# Patient Record
Sex: Female | Born: 1970 | Race: Black or African American | Hispanic: No | Marital: Single | Smoking: Never smoker
Health system: Southern US, Community
[De-identification: ages and names within clinical notes are randomized; demographics above are authoritative.]

## PROBLEM LIST (undated history)

## (undated) DIAGNOSIS — R519 Headache, unspecified: Secondary | ICD-10-CM

## (undated) DIAGNOSIS — K219 Gastro-esophageal reflux disease without esophagitis: Secondary | ICD-10-CM

## (undated) DIAGNOSIS — I10 Essential (primary) hypertension: Secondary | ICD-10-CM

## (undated) DIAGNOSIS — U071 COVID-19: Secondary | ICD-10-CM

## (undated) DIAGNOSIS — F419 Anxiety disorder, unspecified: Secondary | ICD-10-CM

## (undated) DIAGNOSIS — M1990 Unspecified osteoarthritis, unspecified site: Secondary | ICD-10-CM

## (undated) HISTORY — PX: TUBAL LIGATION: SHX77

---
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
## 1997-12-23 ENCOUNTER — Emergency Department (HOSPITAL_COMMUNITY): Admission: EM | Admit: 1997-12-23 | Discharge: 1997-12-23 | Payer: Self-pay | Admitting: Emergency Medicine

## 1998-01-04 ENCOUNTER — Encounter: Admission: RE | Admit: 1998-01-04 | Discharge: 1998-01-04 | Payer: Self-pay | Admitting: *Deleted

## 1998-02-18 ENCOUNTER — Emergency Department (HOSPITAL_COMMUNITY): Admission: EM | Admit: 1998-02-18 | Discharge: 1998-02-18 | Payer: Self-pay | Admitting: Emergency Medicine

## 1998-03-13 ENCOUNTER — Emergency Department (HOSPITAL_COMMUNITY): Admission: EM | Admit: 1998-03-13 | Discharge: 1998-03-13 | Payer: Self-pay | Admitting: Emergency Medicine

## 1998-04-15 ENCOUNTER — Emergency Department (HOSPITAL_COMMUNITY): Admission: EM | Admit: 1998-04-15 | Discharge: 1998-04-15 | Payer: Self-pay | Admitting: Emergency Medicine

## 1998-05-05 ENCOUNTER — Encounter: Admission: RE | Admit: 1998-05-05 | Discharge: 1998-05-05 | Payer: Self-pay | Admitting: Hematology and Oncology

## 1998-07-31 ENCOUNTER — Emergency Department (HOSPITAL_COMMUNITY): Admission: EM | Admit: 1998-07-31 | Discharge: 1998-07-31 | Payer: Self-pay | Admitting: Emergency Medicine

## 1998-10-06 ENCOUNTER — Encounter: Payer: Self-pay | Admitting: Occupational Medicine

## 1998-10-06 ENCOUNTER — Ambulatory Visit: Admission: RE | Admit: 1998-10-06 | Discharge: 1998-10-06 | Payer: Self-pay | Admitting: Occupational Medicine

## 1999-08-01 ENCOUNTER — Ambulatory Visit (HOSPITAL_COMMUNITY): Admission: RE | Admit: 1999-08-01 | Discharge: 1999-08-01 | Payer: Self-pay | Admitting: *Deleted

## 1999-08-15 ENCOUNTER — Inpatient Hospital Stay (HOSPITAL_COMMUNITY): Admission: AD | Admit: 1999-08-15 | Discharge: 1999-08-15 | Payer: Self-pay | Admitting: *Deleted

## 1999-09-02 ENCOUNTER — Inpatient Hospital Stay (HOSPITAL_COMMUNITY): Admission: AD | Admit: 1999-09-02 | Discharge: 1999-09-02 | Payer: Self-pay | Admitting: *Deleted

## 1999-09-26 ENCOUNTER — Ambulatory Visit (HOSPITAL_COMMUNITY): Admission: RE | Admit: 1999-09-26 | Discharge: 1999-09-26 | Payer: Self-pay | Admitting: *Deleted

## 1999-11-30 ENCOUNTER — Inpatient Hospital Stay (HOSPITAL_COMMUNITY): Admission: AD | Admit: 1999-11-30 | Discharge: 1999-11-30 | Payer: Self-pay | Admitting: Obstetrics

## 2000-01-30 ENCOUNTER — Inpatient Hospital Stay (HOSPITAL_COMMUNITY): Admission: AD | Admit: 2000-01-30 | Discharge: 2000-01-30 | Payer: Self-pay | Admitting: *Deleted

## 2000-02-06 ENCOUNTER — Inpatient Hospital Stay (HOSPITAL_COMMUNITY): Admission: AD | Admit: 2000-02-06 | Discharge: 2000-02-06 | Payer: Self-pay | Admitting: *Deleted

## 2000-02-14 ENCOUNTER — Inpatient Hospital Stay (HOSPITAL_COMMUNITY): Admission: AD | Admit: 2000-02-14 | Discharge: 2000-02-17 | Payer: Self-pay | Admitting: Obstetrics

## 2000-02-14 ENCOUNTER — Encounter (INDEPENDENT_AMBULATORY_CARE_PROVIDER_SITE_OTHER): Payer: Self-pay

## 2000-11-27 ENCOUNTER — Emergency Department (HOSPITAL_COMMUNITY): Admission: EM | Admit: 2000-11-27 | Discharge: 2000-11-27 | Payer: Self-pay

## 2001-02-14 ENCOUNTER — Emergency Department (HOSPITAL_COMMUNITY): Admission: EM | Admit: 2001-02-14 | Discharge: 2001-02-14 | Payer: Self-pay | Admitting: Emergency Medicine

## 2001-10-20 ENCOUNTER — Encounter: Admission: RE | Admit: 2001-10-20 | Discharge: 2001-10-20 | Payer: Self-pay

## 2001-11-10 ENCOUNTER — Emergency Department (HOSPITAL_COMMUNITY): Admission: EM | Admit: 2001-11-10 | Discharge: 2001-11-10 | Payer: Self-pay | Admitting: Emergency Medicine

## 2002-05-20 ENCOUNTER — Ambulatory Visit (HOSPITAL_COMMUNITY): Admission: RE | Admit: 2002-05-20 | Discharge: 2002-05-20 | Payer: Self-pay

## 2003-02-20 ENCOUNTER — Emergency Department (HOSPITAL_COMMUNITY): Admission: EM | Admit: 2003-02-20 | Discharge: 2003-02-20 | Payer: Self-pay | Admitting: *Deleted

## 2003-07-19 ENCOUNTER — Emergency Department (HOSPITAL_COMMUNITY): Admission: EM | Admit: 2003-07-19 | Discharge: 2003-07-19 | Payer: Self-pay | Admitting: Emergency Medicine

## 2003-07-20 ENCOUNTER — Encounter (INDEPENDENT_AMBULATORY_CARE_PROVIDER_SITE_OTHER): Payer: Self-pay | Admitting: Specialist

## 2003-07-20 ENCOUNTER — Inpatient Hospital Stay (HOSPITAL_COMMUNITY): Admission: EM | Admit: 2003-07-20 | Discharge: 2003-07-22 | Payer: Self-pay | Admitting: Emergency Medicine

## 2004-01-08 ENCOUNTER — Emergency Department (HOSPITAL_COMMUNITY): Admission: EM | Admit: 2004-01-08 | Discharge: 2004-01-08 | Payer: Self-pay | Admitting: Emergency Medicine

## 2004-01-23 HISTORY — PX: CHOLECYSTECTOMY: SHX55

## 2004-10-10 ENCOUNTER — Other Ambulatory Visit: Admission: RE | Admit: 2004-10-10 | Discharge: 2004-10-10 | Payer: Self-pay | Admitting: Obstetrics and Gynecology

## 2005-04-02 ENCOUNTER — Ambulatory Visit: Payer: Self-pay | Admitting: Family Medicine

## 2005-04-03 ENCOUNTER — Ambulatory Visit: Payer: Self-pay | Admitting: *Deleted

## 2005-06-27 ENCOUNTER — Ambulatory Visit: Payer: Self-pay | Admitting: Family Medicine

## 2005-07-19 ENCOUNTER — Ambulatory Visit: Payer: Self-pay | Admitting: Family Medicine

## 2005-07-26 ENCOUNTER — Ambulatory Visit (HOSPITAL_COMMUNITY): Admission: RE | Admit: 2005-07-26 | Discharge: 2005-07-26 | Payer: Self-pay | Admitting: Family Medicine

## 2005-08-02 ENCOUNTER — Ambulatory Visit: Payer: Self-pay | Admitting: Family Medicine

## 2005-10-10 ENCOUNTER — Ambulatory Visit: Payer: Self-pay | Admitting: Family Medicine

## 2005-10-11 ENCOUNTER — Ambulatory Visit: Payer: Self-pay | Admitting: Family Medicine

## 2005-10-11 ENCOUNTER — Ambulatory Visit (HOSPITAL_COMMUNITY): Admission: RE | Admit: 2005-10-11 | Discharge: 2005-10-11 | Payer: Self-pay | Admitting: Family Medicine

## 2005-12-24 ENCOUNTER — Ambulatory Visit: Payer: Self-pay | Admitting: Family Medicine

## 2006-01-03 ENCOUNTER — Ambulatory Visit: Payer: Self-pay | Admitting: Family Medicine

## 2006-01-27 ENCOUNTER — Emergency Department (HOSPITAL_COMMUNITY): Admission: EM | Admit: 2006-01-27 | Discharge: 2006-01-27 | Payer: Self-pay | Admitting: Emergency Medicine

## 2006-02-21 ENCOUNTER — Ambulatory Visit: Payer: Self-pay | Admitting: Internal Medicine

## 2006-02-27 ENCOUNTER — Ambulatory Visit (HOSPITAL_COMMUNITY): Admission: RE | Admit: 2006-02-27 | Discharge: 2006-02-27 | Payer: Self-pay | Admitting: Internal Medicine

## 2006-03-07 ENCOUNTER — Ambulatory Visit: Payer: Self-pay | Admitting: Internal Medicine

## 2006-03-11 ENCOUNTER — Ambulatory Visit: Payer: Self-pay | Admitting: Family Medicine

## 2006-03-11 ENCOUNTER — Encounter (INDEPENDENT_AMBULATORY_CARE_PROVIDER_SITE_OTHER): Payer: Self-pay | Admitting: Family Medicine

## 2006-05-16 ENCOUNTER — Ambulatory Visit: Payer: Self-pay | Admitting: Internal Medicine

## 2006-05-17 ENCOUNTER — Ambulatory Visit: Payer: Self-pay | Admitting: Internal Medicine

## 2006-07-11 ENCOUNTER — Ambulatory Visit: Payer: Self-pay | Admitting: Internal Medicine

## 2006-08-09 ENCOUNTER — Ambulatory Visit: Payer: Self-pay | Admitting: Family Medicine

## 2006-10-20 ENCOUNTER — Emergency Department (HOSPITAL_COMMUNITY): Admission: EM | Admit: 2006-10-20 | Discharge: 2006-10-20 | Payer: Self-pay | Admitting: Family Medicine

## 2006-11-01 ENCOUNTER — Ambulatory Visit: Payer: Self-pay | Admitting: Internal Medicine

## 2007-01-07 ENCOUNTER — Ambulatory Visit: Payer: Self-pay | Admitting: Family Medicine

## 2007-01-07 ENCOUNTER — Encounter (INDEPENDENT_AMBULATORY_CARE_PROVIDER_SITE_OTHER): Payer: Self-pay | Admitting: Family Medicine

## 2007-01-07 LAB — CONVERTED CEMR LAB
ALT: 8 units/L (ref 0–35)
AST: 15 units/L (ref 0–37)
Albumin: 4.3 g/dL (ref 3.5–5.2)
Alkaline Phosphatase: 64 units/L (ref 39–117)
BUN: 9 mg/dL (ref 6–23)
Basophils Absolute: 0 10*3/uL (ref 0.0–0.1)
Basophils Relative: 0 % (ref 0–1)
CO2: 21 meq/L (ref 19–32)
Calcium: 9.3 mg/dL (ref 8.4–10.5)
Chloride: 108 meq/L (ref 96–112)
Cholesterol: 158 mg/dL (ref 0–200)
Creatinine, Ser: 0.86 mg/dL (ref 0.40–1.20)
Eosinophils Absolute: 0 10*3/uL — ABNORMAL LOW (ref 0.2–0.7)
Eosinophils Relative: 1 % (ref 0–5)
Glucose, Bld: 76 mg/dL (ref 70–99)
HCT: 39.7 % (ref 36.0–46.0)
HDL: 48 mg/dL (ref >39)
Hemoglobin: 13.4 g/dL (ref 12.0–15.0)
LDL Cholesterol: 100 mg/dL — ABNORMAL HIGH (ref 0–99)
Lymphocytes Relative: 39 % (ref 12–46)
Lymphs Abs: 2.2 10*3/uL (ref 0.7–4.0)
MCHC: 33.8 g/dL (ref 30.0–36.0)
MCV: 86.9 fL (ref 78.0–100.0)
Monocytes Absolute: 0.5 10*3/uL (ref 0.1–1.0)
Monocytes Relative: 8 % (ref 3–12)
Neutro Abs: 2.9 10*3/uL (ref 1.7–7.7)
Neutrophils Relative %: 52 % (ref 43–77)
Platelets: 246 10*3/uL (ref 150–400)
Potassium: 3.8 meq/L (ref 3.5–5.3)
RBC: 4.57 M/uL (ref 3.87–5.11)
RDW: 13.3 % (ref 11.5–15.5)
Sodium: 141 meq/L (ref 135–145)
TSH: 0.856 microintl units/mL (ref 0.350–5.50)
Total Bilirubin: 1 mg/dL (ref 0.3–1.2)
Total CHOL/HDL Ratio: 3.3
Total Protein: 8.3 g/dL (ref 6.0–8.3)
Triglycerides: 52 mg/dL (ref <150)
VLDL: 10 mg/dL (ref 0–40)
WBC: 5.7 10*3/uL (ref 4.0–10.5)

## 2007-01-24 ENCOUNTER — Ambulatory Visit: Payer: Self-pay | Admitting: Internal Medicine

## 2007-03-10 ENCOUNTER — Ambulatory Visit: Payer: Self-pay | Admitting: Family Medicine

## 2007-03-10 ENCOUNTER — Encounter (INDEPENDENT_AMBULATORY_CARE_PROVIDER_SITE_OTHER): Payer: Self-pay | Admitting: Internal Medicine

## 2007-03-10 ENCOUNTER — Encounter: Payer: Self-pay | Admitting: Family Medicine

## 2007-03-10 LAB — CONVERTED CEMR LAB: RBC / HPF: NONE SEEN (ref <3)

## 2007-03-15 ENCOUNTER — Emergency Department (HOSPITAL_COMMUNITY): Admission: EM | Admit: 2007-03-15 | Discharge: 2007-03-15 | Payer: Self-pay | Admitting: Family Medicine

## 2007-04-24 ENCOUNTER — Ambulatory Visit: Payer: Self-pay | Admitting: Internal Medicine

## 2007-07-16 ENCOUNTER — Ambulatory Visit: Payer: Self-pay | Admitting: Internal Medicine

## 2007-10-15 ENCOUNTER — Ambulatory Visit: Payer: Self-pay | Admitting: Internal Medicine

## 2007-11-27 ENCOUNTER — Emergency Department (HOSPITAL_COMMUNITY): Admission: EM | Admit: 2007-11-27 | Discharge: 2007-11-27 | Payer: Self-pay | Admitting: Emergency Medicine

## 2007-12-30 ENCOUNTER — Encounter (INDEPENDENT_AMBULATORY_CARE_PROVIDER_SITE_OTHER): Payer: Self-pay | Admitting: Adult Health

## 2007-12-30 ENCOUNTER — Ambulatory Visit: Payer: Self-pay | Admitting: Internal Medicine

## 2007-12-30 LAB — CONVERTED CEMR LAB
ALT: 8 units/L (ref 0–35)
Basophils Absolute: 0 10*3/uL (ref 0.0–0.1)
CO2: 21 meq/L (ref 19–32)
Chloride: 110 meq/L (ref 96–112)
Cholesterol: 152 mg/dL (ref 0–200)
Eosinophils Relative: 1 % (ref 0–5)
Lymphocytes Relative: 40 % (ref 12–46)
Neutro Abs: 2.2 10*3/uL (ref 1.7–7.7)
Neutrophils Relative %: 49 % (ref 43–77)
Platelets: 196 10*3/uL (ref 150–400)
Potassium: 3.9 meq/L (ref 3.5–5.3)
RDW: 13.2 % (ref 11.5–15.5)
Sodium: 142 meq/L (ref 135–145)
Total Bilirubin: 1.1 mg/dL (ref 0.3–1.2)
Total Protein: 7.6 g/dL (ref 6.0–8.3)
VLDL: 13 mg/dL (ref 0–40)

## 2008-01-06 ENCOUNTER — Ambulatory Visit: Payer: Self-pay | Admitting: Internal Medicine

## 2008-03-15 ENCOUNTER — Ambulatory Visit: Payer: Self-pay | Admitting: Internal Medicine

## 2008-03-16 ENCOUNTER — Encounter (INDEPENDENT_AMBULATORY_CARE_PROVIDER_SITE_OTHER): Payer: Self-pay | Admitting: Adult Health

## 2008-03-29 ENCOUNTER — Ambulatory Visit: Payer: Self-pay | Admitting: Internal Medicine

## 2008-03-30 ENCOUNTER — Encounter (INDEPENDENT_AMBULATORY_CARE_PROVIDER_SITE_OTHER): Payer: Self-pay | Admitting: Adult Health

## 2008-04-12 ENCOUNTER — Encounter (INDEPENDENT_AMBULATORY_CARE_PROVIDER_SITE_OTHER): Payer: Self-pay | Admitting: Adult Health

## 2008-04-12 ENCOUNTER — Ambulatory Visit: Payer: Self-pay | Admitting: Internal Medicine

## 2008-04-13 ENCOUNTER — Encounter (INDEPENDENT_AMBULATORY_CARE_PROVIDER_SITE_OTHER): Payer: Self-pay | Admitting: Adult Health

## 2008-05-13 ENCOUNTER — Ambulatory Visit: Payer: Self-pay | Admitting: Internal Medicine

## 2008-05-13 ENCOUNTER — Encounter (INDEPENDENT_AMBULATORY_CARE_PROVIDER_SITE_OTHER): Payer: Self-pay | Admitting: Adult Health

## 2008-06-23 ENCOUNTER — Ambulatory Visit: Payer: Self-pay | Admitting: Internal Medicine

## 2008-09-08 ENCOUNTER — Ambulatory Visit: Payer: Self-pay | Admitting: Internal Medicine

## 2008-09-14 ENCOUNTER — Ambulatory Visit: Payer: Self-pay | Admitting: Internal Medicine

## 2008-10-13 ENCOUNTER — Encounter (INDEPENDENT_AMBULATORY_CARE_PROVIDER_SITE_OTHER): Payer: Self-pay | Admitting: Adult Health

## 2008-10-13 ENCOUNTER — Ambulatory Visit: Payer: Self-pay | Admitting: Family Medicine

## 2008-10-13 LAB — CONVERTED CEMR LAB
ALT: 8 units/L (ref 0–35)
AST: 12 units/L (ref 0–37)
Basophils Absolute: 0 10*3/uL (ref 0.0–0.1)
Basophils Relative: 0 % (ref 0–1)
Creatinine, Ser: 0.97 mg/dL (ref 0.40–1.20)
Eosinophils Absolute: 0 10*3/uL (ref 0.0–0.7)
MCHC: 33.4 g/dL (ref 30.0–36.0)
MCV: 87.7 fL (ref 78.0–100.0)
Neutrophils Relative %: 54 % (ref 43–77)
Platelets: 223 10*3/uL (ref 150–400)
TSH: 0.896 microintl units/mL (ref 0.350–4.500)
Total Bilirubin: 0.6 mg/dL (ref 0.3–1.2)
Total CHOL/HDL Ratio: 4.2
VLDL: 25 mg/dL (ref 0–40)
WBC: 6.5 10*3/uL (ref 4.0–10.5)

## 2008-11-29 ENCOUNTER — Ambulatory Visit: Payer: Self-pay | Admitting: Internal Medicine

## 2008-12-02 ENCOUNTER — Ambulatory Visit (HOSPITAL_COMMUNITY): Admission: RE | Admit: 2008-12-02 | Discharge: 2008-12-02 | Payer: Self-pay | Admitting: Internal Medicine

## 2008-12-06 ENCOUNTER — Ambulatory Visit: Payer: Self-pay | Admitting: Internal Medicine

## 2009-01-19 ENCOUNTER — Ambulatory Visit: Payer: Self-pay | Admitting: Internal Medicine

## 2009-01-19 ENCOUNTER — Other Ambulatory Visit: Admission: RE | Admit: 2009-01-19 | Discharge: 2009-01-19 | Payer: Self-pay | Admitting: Internal Medicine

## 2009-01-20 ENCOUNTER — Encounter (INDEPENDENT_AMBULATORY_CARE_PROVIDER_SITE_OTHER): Payer: Self-pay | Admitting: Adult Health

## 2009-03-01 ENCOUNTER — Ambulatory Visit: Payer: Self-pay | Admitting: Internal Medicine

## 2009-04-05 ENCOUNTER — Ambulatory Visit: Payer: Self-pay | Admitting: Family Medicine

## 2009-04-05 ENCOUNTER — Encounter (INDEPENDENT_AMBULATORY_CARE_PROVIDER_SITE_OTHER): Payer: Self-pay | Admitting: Adult Health

## 2009-04-05 LAB — CONVERTED CEMR LAB: Microalb, Ur: 2.07 mg/dL — ABNORMAL HIGH (ref 0.00–1.89)

## 2009-04-07 ENCOUNTER — Ambulatory Visit (HOSPITAL_COMMUNITY): Admission: RE | Admit: 2009-04-07 | Discharge: 2009-04-07 | Payer: Self-pay | Admitting: Family Medicine

## 2009-04-20 ENCOUNTER — Emergency Department (HOSPITAL_COMMUNITY): Admission: EM | Admit: 2009-04-20 | Discharge: 2009-04-20 | Payer: Self-pay | Admitting: Emergency Medicine

## 2009-08-04 ENCOUNTER — Ambulatory Visit: Payer: Self-pay | Admitting: Family Medicine

## 2009-08-17 ENCOUNTER — Ambulatory Visit: Payer: Self-pay | Admitting: Internal Medicine

## 2009-09-29 ENCOUNTER — Emergency Department (HOSPITAL_COMMUNITY): Admission: EM | Admit: 2009-09-29 | Discharge: 2009-09-29 | Payer: Self-pay | Admitting: Emergency Medicine

## 2009-10-03 ENCOUNTER — Emergency Department (HOSPITAL_COMMUNITY): Admission: EM | Admit: 2009-10-03 | Discharge: 2009-10-03 | Payer: Self-pay | Admitting: Family Medicine

## 2009-12-11 ENCOUNTER — Emergency Department (HOSPITAL_COMMUNITY)
Admission: EM | Admit: 2009-12-11 | Discharge: 2009-12-11 | Payer: Self-pay | Source: Home / Self Care | Admitting: Emergency Medicine

## 2009-12-12 ENCOUNTER — Emergency Department (HOSPITAL_COMMUNITY): Admission: EM | Admit: 2009-12-12 | Discharge: 2009-12-12 | Payer: Self-pay | Source: Home / Self Care

## 2010-01-12 ENCOUNTER — Encounter (INDEPENDENT_AMBULATORY_CARE_PROVIDER_SITE_OTHER): Payer: Self-pay | Admitting: *Deleted

## 2010-01-26 ENCOUNTER — Emergency Department (HOSPITAL_COMMUNITY)
Admission: EM | Admit: 2010-01-26 | Discharge: 2010-01-26 | Payer: Self-pay | Source: Home / Self Care | Admitting: Family Medicine

## 2010-01-26 LAB — POCT PREGNANCY, URINE: Preg Test, Ur: NEGATIVE

## 2010-02-07 ENCOUNTER — Emergency Department (HOSPITAL_COMMUNITY)
Admission: EM | Admit: 2010-02-07 | Discharge: 2010-02-07 | Payer: Self-pay | Source: Home / Self Care | Admitting: Family Medicine

## 2010-02-08 LAB — WET PREP, GENITAL
Clue Cells Wet Prep HPF POC: NONE SEEN
Trich, Wet Prep: NONE SEEN

## 2010-02-11 ENCOUNTER — Encounter: Payer: Self-pay | Admitting: Obstetrics and Gynecology

## 2010-02-13 LAB — GC/CHLAMYDIA PROBE AMP, GENITAL
Chlamydia, DNA Probe: NEGATIVE
GC Probe Amp, Genital: NEGATIVE

## 2010-04-04 LAB — COMPREHENSIVE METABOLIC PANEL
Albumin: 3.8 g/dL (ref 3.5–5.2)
BUN: 8 mg/dL (ref 6–23)
Calcium: 9.4 mg/dL (ref 8.4–10.5)
Glucose, Bld: 116 mg/dL — ABNORMAL HIGH (ref 70–99)
Potassium: 3.9 mEq/L (ref 3.5–5.1)
Sodium: 140 mEq/L (ref 135–145)
Total Protein: 7.9 g/dL (ref 6.0–8.3)

## 2010-04-04 LAB — CBC
HCT: 39.3 % (ref 36.0–46.0)
MCHC: 34.5 g/dL (ref 30.0–36.0)
Platelets: 208 10*3/uL (ref 150–400)
RDW: 12.7 % (ref 11.5–15.5)
WBC: 6.2 10*3/uL (ref 4.0–10.5)

## 2010-04-04 LAB — WET PREP, GENITAL
Trich, Wet Prep: NONE SEEN
Yeast Wet Prep HPF POC: NONE SEEN

## 2010-04-04 LAB — DIFFERENTIAL
Lymphs Abs: 1.4 10*3/uL (ref 0.7–4.0)
Monocytes Absolute: 0.4 10*3/uL (ref 0.1–1.0)
Monocytes Relative: 7 % (ref 3–12)
Neutro Abs: 4.4 10*3/uL (ref 1.7–7.7)
Neutrophils Relative %: 70 % (ref 43–77)

## 2010-04-04 LAB — URINALYSIS, ROUTINE W REFLEX MICROSCOPIC
Bilirubin Urine: NEGATIVE
Glucose, UA: NEGATIVE mg/dL
Ketones, ur: NEGATIVE mg/dL
Protein, ur: NEGATIVE mg/dL
Urobilinogen, UA: 0.2 mg/dL (ref 0.0–1.0)

## 2010-04-04 LAB — URINE MICROSCOPIC-ADD ON

## 2010-04-04 LAB — GC/CHLAMYDIA PROBE AMP, GENITAL: Chlamydia, DNA Probe: NEGATIVE

## 2010-04-06 LAB — POCT URINALYSIS DIPSTICK
Bilirubin Urine: NEGATIVE
Glucose, UA: NEGATIVE mg/dL
Ketones, ur: NEGATIVE mg/dL
Specific Gravity, Urine: 1.025 (ref 1.005–1.030)
pH: 5 (ref 5.0–8.0)

## 2010-06-09 NOTE — H&P (Signed)
Kristine Kelly, Kristine Kelly                          ACCOUNT NO.:  [id number]   MEDICAL RECORD NO.:  [id number]                   PATIENT TYPE:  INP   LOCATION:  0102                                 FACILITY:  [facility]   PHYSICIAN:  Adolph Pollack, M.D.            DATE OF BIRTH:  [birth year]   DATE OF ADMISSION:  07/20/2003  DATE OF DISCHARGE:                                HISTORY & PHYSICAL   CHIEF COMPLAINT:  Right upper quadrant pain, nausea, vomiting.   HISTORY OF PRESENT ILLNESS:  This is a 40 year old female who is otherwise  healthy.  She had some tacos Wednesday night and Thursday woke with up with  some pain in the right upper quadrant region radiating through the back and  nausea and vomiting.  It has been progressively increasing over the weekend.  She was seen in the emergency department yesterday, had some blood work done  that was all normal, was given some pain medicine and nausea medication, and  sent home but she presents back today.  Today she was seen back in the  emergency department, had an ultrasound demonstrating gallstones but no  pericholecystic fluid or gallbladder wall thickening.  Despite the  intravenous pain medicine, she continues to have recurring episodes of pain  and I have been asked to see her.   PAST MEDICAL HISTORY:  1. Benign right breast mass.   PREVIOUS OPERATIONS:  1. Bilateral tubal ligation.  2. Bunionectomy.  3. Right breast biopsy.   ALLERGIES:  None.   MEDICATIONS:  1. Phenergan.  2. Zantac.  3. Levsin.   SOCIAL HISTORY:  Single mother.  Works as a Conservation officer, [employer].  Denies tobacco use.  Occasional alcohol use.   FAMILY HISTORY:  No chronic illnesses in her family per her report.   REVIEW OF SYSTEMS:  CARDIAC:  No known hypertension or cardiac disease.  PULMONARY:  No asthma, pneumonia, TB.  GI:  No peptic ulcer disease,  hepatitis, colitis.  She does state that with her last two bouts of emesis,  it was a little bit blood tinged.  GU:   No kidney stones, urinary tract  infections.  ENDOCRINE:  No thyroid disease, diabetes, hypercholesterolemia.  NEUROLOGIC:  No strokes or seizures.  HEMATOLOGIC:  Denies bleeding  disorders, blood clots or transfusions.   PHYSICAL EXAMINATION:  GENERAL:  An ill-appearing female who is obese.  VITAL SIGNS:  Temperature is 97.1, blood pressure 131/86, pulse 81.  EYES:  Extraocular motions intact.  No icterus noted.  NECK:  Supple without palpable masses or obvious thyroid enlargement.  RESPIRATORY:  The breath sounds are equal and clear, respirations unlabored.  CARDIOVASCULAR:  Heart demonstrates a regular rate and rhythm, no murmur  heard.  No lower extremity edema.  ABDOMEN:  Soft with a small subumbilical scar.  There is right upper  quadrant tenderness and guarding, and a positive Murphy's sign.  No palpable  masses, however.  No organomegaly.  EXTREMITIES:  Good muscle tone and full range of motion.   LABORATORY DATA:  From July 19, 2003, demonstrates a CMET that is normal,  amylase and lipase normal, CBC normal.  Urine pregnancy test negative.   IMPRESSION:  Evolving acute cholecystitis __________ recently discovered  cholelithiasis.  Pattern of pain is worrisome for acute cystic duct  obstruction despite normal laboratory values yesterday.   PLAN:  Admission to the hospital for IV fluid hydration and IV antibiotics.  I discussed with her cholecystectomy.  We went over the procedure, the  rationale and the risks.  The risks include but are not limited to bleeding,  infection, anesthetic risks, injury to the common bile duct/intestine, and  liver, postprandial diarrhea, and time off work.  She seemed to understand  this and is agreeable to proceeding.                                               Adolph Pollack, M.D.    Kari Baars  D:  07/20/2003  T:  07/20/2003  Job:  [id number]

## 2010-06-09 NOTE — Op Note (Signed)
Kristine Kelly, Kristine Kelly                          ACCOUNT NO.:  [id number]   MEDICAL RECORD NO.:  [id number]                   PATIENT TYPE:  INP   LOCATION:  0102                                 FACILITY:  [facility]   PHYSICIAN:  Adolph Pollack, M.D.            DATE OF BIRTH:  Dec 18, 1970   DATE OF PROCEDURE:  07/20/2003  DATE OF DISCHARGE:                                 OPERATIVE REPORT   PREOPERATIVE DIAGNOSIS:  Acute cholecystitis.   POSTOPERATIVE DIAGNOSIS:  Acute cholecystitis.   PROCEDURE:  Laparoscopic cholecystectomy with intraoperative cholangiogram.   SURGEON:  Adolph Pollack, M.D.   ASSISTANT:  Anselm Pancoast. Zachery Dakins, M.D.   ANESTHESIA:  General.   INDICATIONS:  This is a 40 year old female who has had progressively  increasing and persistent right upper quadrant pain who has been seen in the  emergency department twice with the last time today.  She has gallstones.  Clinically, she has acute cholecystitis by exam and history.  Liver function  tests and white blood cell count are normal at this time.  She now presents  for a cholecystectomy.   TECHNIQUE:  She was seen in the holding area and then brought to the  operating room and placed supine on the operating room table.  A general  anesthetic was administered.  The abdominal wall was sterilely prepped and  draped.  Local anesthetic consisting of __________ Marcaine was infiltrated  in the subumbilical region, and a small subumbilical incision was made  through the skin and subcutaneous tissue until the fascia was identified.  A  small incision was made in the midline fascia, and the peritoneal cavity was  entered bluntly and under direct vision.  A purse-string suture of 0 Vicryl  was placed around the fascial edges.  A Hasson trocar was introduced into  the peritoneal cavity.  A pneumoperitoneum was created by insufflation of  CO2 gas.  The laparoscope was then introduced, and she was placed in reverse  Trendelenburg position with the right side tilted slightly up.  The  gallbladder was edematous, inflamed, and tense.  An 11 mm trocar was placed  through an epigastric incision, and two 5 mm trocars were placed in the  right mid abdomen.  We were able to grasp the fundus of the gallbladder and  retract it towards the right shoulder.  The infundibulum was then grasped,  and it was mobilized.  Using blunt dissection, I identified the cystic duct  and the cystic artery and created windows around these.  The cystic artery  laid somewhat anterior to the cystic duct.  I clipped the cystic artery and  then divided it.  I then placed a clip at the cystic duct/gallbladder  junction and made a small incision in the cystic duct.  A cholangiocatheter  was passed through the anterior abdominal wall and put into the cystic duct  and a cholangiogram was performed.   Under  real-time fluoroscopy, dilute contrast material was injected into the  cystic duct.  There was a relatively short cystic duct.  The common hepatic,  right and left hepatic, and common bile ducts were all opacified, and  contrast quickly spilled into the duodenum without obvious evidence of  obstruction.  Final report is pending the radiologist's interpretation.   The cholangiocatheter was removed.  The cystic duct was then clipped three  times and __________ and divided sharply.  Using electrocautery, the  gallbladder was dissected free from the liver bed.  A very small puncture  wound was made in the gallbladder, and white bile was noted.  Most of this  was suctioned out.  Once the gallbladder was removed from the liver bed, it  was placed in the Endopouch bag.  The gallbladder fossa was copiously  irrigated with saline and bleeding points were controlled with cautery.  I  used approximately 2 liters of solution to irrigate the perihepatic area and  evacuate this.  The area was inspected once more, and no bile leak or  bleeding was  noted.   Next, I removed the gallbladder in the Endopouch bag through the  subumbilical incision and then closed the fascia by tightening up and tying  down the purse-string suture under laparoscopic vision.  The remaining  trocars were removed, and a pneumoperitoneum was released.  The skin  incisions were closed with 4-0 Monocryl and subcuticular stitches.  Sterile  dressings were applied.   She tolerated the procedure well without any apparent complications.  She  was taken to the recovery room in satisfactory condition.                                               Adolph Pollack, M.D.    Kari Baars  D:  07/20/2003  T:  07/20/2003  Job:  [id number]

## 2010-06-09 NOTE — Op Note (Signed)
[facility]  Patient:    Kristine Kelly, Kristine Kelly                       MRN: [id number] Proc. Date: 02/15/00 Adm. Date:  [id number] Disc. Date: [id number] Attending:  Michaelle Copas                           Operative Report  PREOPERATIVE DIAGNOSIS:       Multiparity, desires permanent sterilization.  POSTOPERATIVE DIAGNOSIS:      Multiparity, desires permanent sterilization.  PROCEDURE:                    Postpartum tubal ligation, modified                               Pomeroy method. SURGEON:                      Roseanna Rainbow, M.D.  ANESTHESIA:                   Spinal.  COMPLICATIONS:                None.  ESTIMATED BLOOD LOSS:         Less than 20 cc.  FLUIDS:                       As per anesthesia.  INDICATIONS:                  The patient is a para 3, status post an SVD who desires permanent sterilization. The risks and benefits of the procedure were discussed with the patient including risk of failure of 4-8:1000 with an increased risk of an ectopic gestation if pregnancy occurs.  FINDINGS:                     Normal tubes and uterus.  DESCRIPTION OF PROCEDURE:     The patient was taken to the operating room where a spinal anesthetic was administered and found to be adequate. A small transverse infraumbilical skin incision was then made with the scalpel. The incision was carried down through the underlying fascia until the parietal peritoneum was identified and entered. The peritoneum was noted to be free of any adhesions and the incision was extended with the Metzenbaum scissors. The patients left fallopian tube was then identified, brought to the incision and grasped with a Babcock clamp and the tube was then followed out to the fimbria. The Babcock clamps was then used to grasp the tube approximately 4 cm from the cornual region. A 2- to 3-cm segment of tube was doubly ligated with free ties of plain gut and excised.  Excellent hemostasis was noted and the tube was returned to the abdomen. The right fallopian tube was then manipulated in a similar fashion. The peritoneum and fascia were closed in a single layer using 0 Vicryl. The skin was closed in a subcuticular fashion using 3-0 Vicryl. The patient tolerated the procedure well. Sponge, lap, and needle counts were correct x 2. The patient was taken to the PACU in stable condition.  PATHOLOGY:                    Segments of right and left fallopian tubes. DD:  02/15/00 TD:  02/15/00 Job: [id number]

## 2010-08-27 ENCOUNTER — Inpatient Hospital Stay (INDEPENDENT_AMBULATORY_CARE_PROVIDER_SITE_OTHER)
Admission: RE | Admit: 2010-08-27 | Discharge: 2010-08-27 | Disposition: A | Discharge status: Home / Self Care | Payer: Self-pay | Source: Ambulatory Visit | Attending: Family Medicine | Admitting: Family Medicine

## 2010-08-27 DIAGNOSIS — H6690 Otitis media, unspecified, unspecified ear: Secondary | ICD-10-CM

## 2010-10-03 ENCOUNTER — Other Ambulatory Visit: Payer: Self-pay | Admitting: Family Medicine

## 2010-10-04 ENCOUNTER — Other Ambulatory Visit: Payer: Self-pay | Admitting: Internal Medicine

## 2010-10-04 DIAGNOSIS — R102 Pelvic and perineal pain: Secondary | ICD-10-CM

## 2010-10-09 ENCOUNTER — Ambulatory Visit (HOSPITAL_COMMUNITY)
Admission: RE | Admit: 2010-10-09 | Discharge: 2010-10-09 | Disposition: A | Discharge status: Home / Self Care | Payer: Self-pay | Source: Ambulatory Visit | Attending: Internal Medicine | Admitting: Internal Medicine

## 2010-10-09 DIAGNOSIS — R102 Pelvic and perineal pain: Secondary | ICD-10-CM

## 2010-10-09 DIAGNOSIS — R109 Unspecified abdominal pain: Secondary | ICD-10-CM | POA: Insufficient documentation

## 2010-10-24 LAB — BASIC METABOLIC PANEL
CO2: 23
Calcium: 9.2
Chloride: 109
GFR calc Af Amer: >60
Potassium: 3.5
Sodium: 140

## 2010-10-24 LAB — POCT CARDIAC MARKERS
CKMB, poc: 1.4
Troponin i, poc: <0.05

## 2010-12-13 ENCOUNTER — Emergency Department (HOSPITAL_COMMUNITY)
Admission: EM | Admit: 2010-12-13 | Discharge: 2010-12-13 | Disposition: A | Discharge status: Home / Self Care | Payer: Self-pay | Source: Home / Self Care | Attending: Family Medicine | Admitting: Family Medicine

## 2010-12-13 DIAGNOSIS — B373 Candidiasis of vulva and vagina: Secondary | ICD-10-CM

## 2010-12-13 HISTORY — DX: Essential (primary) hypertension: I10

## 2010-12-13 LAB — POCT URINALYSIS DIP (DEVICE)
Ketones, ur: 15 mg/dL — AB
Leukocytes, UA: NEGATIVE
Protein, ur: NEGATIVE mg/dL
pH: 5.5 (ref 5.0–8.0)

## 2010-12-13 LAB — WET PREP, GENITAL: Yeast Wet Prep HPF POC: NONE SEEN

## 2010-12-13 MED ORDER — FLUCONAZOLE 150 MG PO TABS: 150.0000 mg | ORAL_TABLET | Freq: Once | ORAL | Status: DC

## 2010-12-13 MED ORDER — FLUCONAZOLE 150 MG PO TABS: 150.0000 mg | ORAL_TABLET | Freq: Once | ORAL | Status: AC

## 2010-12-13 MED ORDER — TERCONAZOLE 80 MG VA SUPP: 80.0000 mg | Freq: Every day | VAGINAL | Status: AC

## 2010-12-13 NOTE — ED Notes (Signed)
C/o vaginal discharge, itching and burning for 3 days. States she used monistat last night without any improvement.  Denies urinary sx.

## 2010-12-13 NOTE — ED Provider Notes (Signed)
History     CSN: [id number] Arrival date & time: 12/13/2010  7:33 PM   First MD Initiated Contact with Patient 12/13/10 1928      Chief Complaint  Patient presents with  . Vaginal Discharge    (Consider location/radiation/quality/duration/timing/severity/associated sxs/prior treatment) Patient is a 40 y.o. female presenting with vaginal discharge. The history is provided by the patient.  Vaginal Discharge This is a new problem. The current episode started 2 days ago. The problem has not changed since onset.The symptoms are aggravated by nothing. The symptoms are relieved by nothing. She has tried nothing for the symptoms.    Past Medical History  Diagnosis Date  . Hypertension     Past Surgical History  Procedure Date  . Breast lumpectomy   . Tubal ligation     No family history on file.  History  Substance Use Topics  . Smoking status: Never Smoker   . Smokeless tobacco: Not on file  . Alcohol Use: No    OB History    Grav Para Term Preterm Abortions TAB SAB Ect Mult Living                  Review of Systems  Constitutional: Negative.   Gastrointestinal: Negative.   Genitourinary: Positive for vaginal discharge. Negative for vaginal bleeding and vaginal pain.    Allergies  Review of patient's allergies indicates no known allergies.  Home Medications  No current outpatient prescriptions on file.  BP 141/89  Pulse 78  Temp(Src) 98.4 F (36.9 C) (Oral)  Resp 18  SpO2 99%  LMP 11/24/2010  Physical Exam  Constitutional: She appears well-developed and well-nourished.  Abdominal: Soft. Bowel sounds are normal. There is no tenderness.  Genitourinary: Uterus normal.    There is erythema around the vagina. Vaginal discharge found.    ED Course  Procedures (including critical care time)   Labs Reviewed  POCT URINALYSIS DIPSTICK  POCT PREGNANCY, URINE   No results found.   No diagnosis found.    MDM          Barkley Bruns,  MD 12/13/10 2005

## 2010-12-14 LAB — GC/CHLAMYDIA PROBE AMP, GENITAL
Chlamydia, DNA Probe: NEGATIVE
GC Probe Amp, Genital: NEGATIVE

## 2011-07-14 ENCOUNTER — Emergency Department (INDEPENDENT_AMBULATORY_CARE_PROVIDER_SITE_OTHER): Payer: Self-pay

## 2011-07-14 ENCOUNTER — Encounter (HOSPITAL_COMMUNITY): Payer: Self-pay | Admitting: *Deleted

## 2011-07-14 ENCOUNTER — Emergency Department (HOSPITAL_COMMUNITY)
Admission: EM | Admit: 2011-07-14 | Discharge: 2011-07-14 | Disposition: A | Discharge status: Home / Self Care | Payer: Self-pay | Source: Home / Self Care | Attending: Emergency Medicine | Admitting: Emergency Medicine

## 2011-07-14 DIAGNOSIS — G5600 Carpal tunnel syndrome, unspecified upper limb: Secondary | ICD-10-CM

## 2011-07-14 DIAGNOSIS — M7520 Bicipital tendinitis, unspecified shoulder: Secondary | ICD-10-CM

## 2011-07-14 LAB — POCT I-STAT, CHEM 8
Calcium, Ion: 1.27 mmol/L (ref 1.12–1.32)
Chloride: 103 mEq/L (ref 96–112)
Creatinine, Ser: 0.9 mg/dL (ref 0.50–1.10)
Glucose, Bld: 75 mg/dL (ref 70–99)
HCT: 40 % (ref 36.0–46.0)

## 2011-07-14 MED ORDER — MELOXICAM 15 MG PO TABS: 15.0000 mg | ORAL_TABLET | Freq: Every day | ORAL | Status: DC

## 2011-07-14 MED ORDER — PREDNISONE 10 MG PO TABS: ORAL_TABLET | ORAL | Status: DC

## 2011-07-14 MED ORDER — TRAMADOL HCL 50 MG PO TABS: 100.0000 mg | ORAL_TABLET | Freq: Three times a day (TID) | ORAL | Status: AC | PRN

## 2011-07-14 NOTE — Discharge Instructions (Signed)
Do exercises as described twice daily followed by ice for 10 minutes.

## 2011-07-14 NOTE — ED Notes (Signed)
Pt with c/o right shoulder pain onset x 2 weeks increased pain with movement  -  c/o numbness bilateral hands intermittently worse at night

## 2011-07-14 NOTE — ED Provider Notes (Signed)
Chief Complaint  Patient presents with  . Shoulder Pain    History of Present Illness:   Kristine Kelly is a 41 year old female who works at [employer]. The past 2 weeks she's had pain in her anterior right shoulder. She denies any injury but she does lots of lifting and uses her hands frequently on her job. Her pain is aggravated by work-related activities. She has pain with abduction and any movement of her shoulder. She denies any neck pain. She has some radiation the pain at the upper arm. She also has bilateral hand numbness and tingling over the past 2 weeks. This is also painful. It's worse at nighttime. Her arms feel weak. This is also worse at work as well.  Review of Systems:  Other than noted above, the patient denies any of the following symptoms: Systemic:  No fevers, chills, sweats, or aches.  No fatigue or tiredness. Musculoskeletal:  No joint pain, arthritis, bursitis, swelling, back pain, or neck pain. Neurological:  No muscular weakness, paresthesias, headache, or trouble with speech or coordination.  No dizziness.   PMFSH:  Past medical history, family history, social history, meds, and allergies were reviewed.  Physical Exam:   Vital signs:  BP 144/90  Pulse 57  Temp 98 F (36.7 C) (Oral)  Resp 18  SpO2 100%  LMP 06/25/2011 Gen:  Alert and oriented times 3.  In no distress. Musculoskeletal: She has some pain to palpation in her right trapezius ridge. There is pain to palpation over the bicipital tendon. The shoulder has a limited range of motion both actively and passively with pain on abduction, flexion, internal and external rotation impingement signs are all positive. O'Brien's test was positive, Yergason sign was positive. Exam of the hands reveals pain to palpation over the carpal tunnels bilaterally left worse than right. There was no swelling. Tinel's and Phalen's signs are positive. Otherwise, all joints had a full a ROM with no swelling, bruising or deformity.  No  edema, pulses full. Extremities were warm and pink.  Capillary refill was brisk.  Skin:  Clear, warm and dry.  No rash. Neuro:  Alert and oriented times 3.  Muscle strength was normal.  Sensation was intact to light touch.   Radiology:  Dg Shoulder Right  07/14/2011  *RADIOLOGY REPORT*  Clinical Data: Right shoulder pain.  No injury.  RIGHT SHOULDER - 2+ VIEW  Comparison: None.  Findings: Mild degenerative changes in the right AC joint. No acute bony abnormality.  Specifically, no fracture, subluxation, or dislocation.  Soft tissues are intact.  IMPRESSION: No acute bony abnormality.  Original Report Authenticated By: Cyndie Chime, M.D.   Course in Urgent Care Center:   She was given wrist splints for both wrists.  Assessment:  The primary encounter diagnosis was Tendonitis, bicipital. A diagnosis of Carpal tunnel syndrome was also pertinent to this visit.  Plan:   1.  The following meds were prescribed:   New Prescriptions   MELOXICAM (MOBIC) 15 MG TABLET    Take 1 tablet (15 mg total) by mouth daily.   PREDNISONE (DELTASONE) 10 MG TABLET    Take 4 tabs daily for 4 days, 3 tabs daily for 4 days, 2 tabs daily for 4 days, then 1 tab daily for 4 days.   TRAMADOL (ULTRAM) 50 MG TABLET    Take 2 tablets (100 mg total) by mouth every 8 (eight) hours as needed for pain.   2.  The patient was instructed in symptomatic care, including rest  and activity, elevation, application of ice and compression.  Appropriate handouts were given. 3.  The patient was told to return if becoming worse in any way, if no better in 3 or 4 days, and given some red flag symptoms that would indicate earlier return.   4.  The patient was told to follow up with Dr. Magnus Ivan next week. She was given shoulder and carpal tunnel stretching exercises to do in the meantime.   Reuben Likes, MD 07/14/11 [phone]

## 2011-10-01 ENCOUNTER — Encounter (HOSPITAL_COMMUNITY): Payer: Self-pay | Admitting: *Deleted

## 2011-10-01 ENCOUNTER — Emergency Department (HOSPITAL_COMMUNITY): Payer: Self-pay

## 2011-10-01 ENCOUNTER — Emergency Department (HOSPITAL_COMMUNITY)
Admission: EM | Admit: 2011-10-01 | Discharge: 2011-10-01 | Disposition: A | Discharge status: Home / Self Care | Payer: Self-pay | Attending: Emergency Medicine | Admitting: Emergency Medicine

## 2011-10-01 DIAGNOSIS — R3915 Urgency of urination: Secondary | ICD-10-CM | POA: Insufficient documentation

## 2011-10-01 DIAGNOSIS — M549 Dorsalgia, unspecified: Secondary | ICD-10-CM | POA: Insufficient documentation

## 2011-10-01 DIAGNOSIS — I10 Essential (primary) hypertension: Secondary | ICD-10-CM | POA: Insufficient documentation

## 2011-10-01 LAB — URINALYSIS, ROUTINE W REFLEX MICROSCOPIC
Bilirubin Urine: NEGATIVE
Glucose, UA: NEGATIVE mg/dL
Hgb urine dipstick: NEGATIVE
Ketones, ur: NEGATIVE mg/dL
Nitrite: NEGATIVE
pH: 7 (ref 5.0–8.0)

## 2011-10-01 MED ORDER — IBUPROFEN 200 MG PO TABS
600.0000 mg | ORAL_TABLET | Freq: Once | ORAL | Status: AC
  Administered 2011-10-01: 600 mg via ORAL
  Filled 2011-10-01: qty 3

## 2011-10-01 NOTE — ED Notes (Signed)
Pt c/o low back pain x2 months. Urinary frequency and urgency x2 weeks.

## 2011-10-02 NOTE — ED Provider Notes (Signed)
History     CSN: [id number]  Arrival date & time 10/01/11  4098   First MD Initiated Contact with Patient 10/01/11 [phone]      Chief Complaint  Patient presents with  . Back Pain  . Urinary Frequency    (Consider location/radiation/quality/duration/timing/severity/associated sxs/prior treatment) HPI Comments: Pt with no significant medical hx comes in with cc of urinary urgency overt the past few days and back pain x 2 months. Pt's back pain is located in the lumbar region and is constant pain, no precipitating event. No associated numbness, weakness, urinary incontinence, urinary retention, bowel incontinence, weakness. Pt has urinary urgency - with no dyruria, hematuria. No hx of renal stones.   Patient is a 41 y.o. female presenting with back pain and frequency. The history is provided by the patient.  Back Pain  Pertinent negatives include no chest pain, no headaches, no abdominal pain and no dysuria.  Urinary Frequency Pertinent negatives include no chest pain, no abdominal pain, no headaches and no shortness of breath.    Past Medical History  Diagnosis Date  . Hypertension     Past Surgical History  Procedure Date  . Breast lumpectomy   . Tubal ligation     No family history on file.  History  Substance Use Topics  . Smoking status: Never Smoker   . Smokeless tobacco: Not on file  . Alcohol Use: No    OB History    Grav Para Term Preterm Abortions TAB SAB Ect Mult Living                  Review of Systems  Constitutional: Positive for activity change.  HENT: Negative for neck pain.   Respiratory: Negative for shortness of breath.   Cardiovascular: Negative for chest pain.  Gastrointestinal: Negative for nausea, vomiting and abdominal pain.  Genitourinary: Positive for frequency. Negative for dysuria.  Musculoskeletal: Positive for back pain.  Neurological: Negative for headaches.    Allergies  Aspirin and Atenolol  Home Medications  No current  outpatient prescriptions on file.  BP 104/3  Pulse 72  Temp 98.3 F (36.8 C) (Oral)  Resp 18  SpO2 100%  LMP 09/17/2011  Physical Exam  Nursing note and vitals reviewed. Constitutional: She is oriented to person, place, and time. She appears well-developed and well-nourished.  HENT:  Head: Normocephalic and atraumatic.  Eyes: EOM are normal. Pupils are equal, round, and reactive to light.  Neck: Neck supple.  Cardiovascular: Normal rate, regular rhythm and normal heart sounds.   No murmur heard. Pulmonary/Chest: Effort normal. No respiratory distress.  Abdominal: Soft. She exhibits no distension. There is no tenderness. There is no rebound and no guarding.  Musculoskeletal:       Back tenderness in the lumbar region in only.  Neurological: She is alert and oriented to person, place, and time.  Skin: Skin is warm and dry.    ED Course  Procedures (including critical care time)   Labs Reviewed  URINALYSIS, ROUTINE W REFLEX MICROSCOPIC  POCT PREGNANCY, URINE  LAB REPORT - SCANNED   Dg Lumbar Spine Complete  10/01/2011  *RADIOLOGY REPORT*  Clinical Data: Chronic low back pain with weakness and numbness in extremities.  LUMBAR SPINE - COMPLETE 4+ VIEW  Comparison: 07/26/2005 lumbar spine series.  Findings: There is chronic deformity of the L1 vertebral body with a large lateral osteophyte present on the left at this level. There is also partial fusion of the T12-L1 vertebra.  This is unchanged compared  to the prior study.  The remaining vertebra having a normal appearance.  There is no evidence for recent fracture, subluxation, or destructive change.  The interspaces are normally maintained.  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  This is stable when compared with the 2007 examination.  No acute findings.   Original Report Authenticated By: Rolla Plate, M.D.      1.  Back pain   2. Urinary urgency       MDM  DDx includes: - DJD of the back - Spondylitises/ spondylosis - Sciatica - Spinal cord compression - Conus medullaris - Epidural hematoma - Epidural abscess - Lytic/pathologic fracture - Myelitis - Musculoskeletal pain  Back pain  - no red flags on hx. Likely musculoskeletal. Urinary urgency - we will get UA - if negative, will culture- no AB.        Derwood Kaplan, MD 10/02/11 [phone]

## 2011-12-28 ENCOUNTER — Emergency Department (INDEPENDENT_AMBULATORY_CARE_PROVIDER_SITE_OTHER)
Admission: EM | Admit: 2011-12-28 | Discharge: 2011-12-28 | Disposition: A | Discharge status: Home / Self Care | Payer: Self-pay | Source: Home / Self Care

## 2011-12-28 ENCOUNTER — Encounter (HOSPITAL_COMMUNITY): Payer: Self-pay

## 2011-12-28 ENCOUNTER — Emergency Department (HOSPITAL_COMMUNITY)
Admit: 2011-12-28 | Discharge: 2011-12-28 | Disposition: A | Discharge status: Home / Self Care | Payer: Self-pay | Attending: Internal Medicine | Admitting: Internal Medicine

## 2011-12-28 DIAGNOSIS — S93401A Sprain of unspecified ligament of right ankle, initial encounter: Secondary | ICD-10-CM

## 2011-12-28 DIAGNOSIS — M25579 Pain in unspecified ankle and joints of unspecified foot: Secondary | ICD-10-CM | POA: Insufficient documentation

## 2011-12-28 DIAGNOSIS — S93409A Sprain of unspecified ligament of unspecified ankle, initial encounter: Secondary | ICD-10-CM

## 2011-12-28 MED ORDER — MELOXICAM 15 MG PO TABS: 15.0000 mg | ORAL_TABLET | Freq: Every day | ORAL | Status: DC

## 2011-12-28 NOTE — ED Notes (Signed)
Patient states twisted right ankle almost 3 weeks ago and it is still sore today
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
## 2012-03-18 ENCOUNTER — Emergency Department (HOSPITAL_COMMUNITY)
Admission: EM | Admit: 2012-03-18 | Discharge: 2012-03-18 | Disposition: A | Discharge status: Home / Self Care | Payer: No Typology Code available for payment source | Attending: Emergency Medicine | Admitting: Emergency Medicine

## 2012-03-18 ENCOUNTER — Encounter (HOSPITAL_COMMUNITY): Payer: Self-pay | Admitting: Emergency Medicine

## 2012-03-18 DIAGNOSIS — Y9389 Activity, other specified: Secondary | ICD-10-CM | POA: Insufficient documentation

## 2012-03-18 DIAGNOSIS — M5380 Other specified dorsopathies, site unspecified: Secondary | ICD-10-CM | POA: Insufficient documentation

## 2012-03-18 DIAGNOSIS — Y92410 Unspecified street and highway as the place of occurrence of the external cause: Secondary | ICD-10-CM | POA: Insufficient documentation

## 2012-03-18 DIAGNOSIS — I10 Essential (primary) hypertension: Secondary | ICD-10-CM | POA: Insufficient documentation

## 2012-03-18 DIAGNOSIS — R209 Unspecified disturbances of skin sensation: Secondary | ICD-10-CM | POA: Insufficient documentation

## 2012-03-18 DIAGNOSIS — S139XXA Sprain of joints and ligaments of unspecified parts of neck, initial encounter: Secondary | ICD-10-CM | POA: Insufficient documentation

## 2012-03-18 MED ORDER — METHOCARBAMOL 750 MG PO TABS: 750.0000 mg | ORAL_TABLET | Freq: Four times a day (QID) | ORAL | Status: DC | PRN

## 2012-03-18 MED ORDER — HYDROCODONE-ACETAMINOPHEN 5-325 MG PO TABS
2.0000 | ORAL_TABLET | Freq: Once | ORAL | Status: AC
  Administered 2012-03-18: 2 via ORAL
  Filled 2012-03-18: qty 2

## 2012-03-18 MED ORDER — NAPROXEN 500 MG PO TABS: 500.0000 mg | ORAL_TABLET | Freq: Two times a day (BID) | ORAL | Status: DC | PRN

## 2012-03-18 MED ORDER — DIAZEPAM 5 MG PO TABS
10.0000 mg | ORAL_TABLET | Freq: Once | ORAL | Status: AC
  Administered 2012-03-18: 10 mg via ORAL
  Filled 2012-03-18: qty 2

## 2012-03-18 MED ORDER — HYDROCODONE-ACETAMINOPHEN 5-325 MG PO TABS: 1.0000 | ORAL_TABLET | Freq: Four times a day (QID) | ORAL | Status: DC | PRN

## 2012-03-18 NOTE — ED Provider Notes (Signed)
  Medical screening examination/treatment/procedure(s) were performed by non-physician practitioner and as supervising physician I was immediately available for consultation/collaboration.    Gerhard Munch, MD 03/18/12 [phone]
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
## 2012-03-18 NOTE — ED Notes (Signed)
Pt states she has a ride home. 
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
## 2012-08-20 ENCOUNTER — Encounter (HOSPITAL_COMMUNITY): Payer: Self-pay | Admitting: *Deleted

## 2012-08-20 ENCOUNTER — Emergency Department (INDEPENDENT_AMBULATORY_CARE_PROVIDER_SITE_OTHER)
Admission: EM | Admit: 2012-08-20 | Discharge: 2012-08-20 | Disposition: A | Discharge status: Home / Self Care | Payer: No Typology Code available for payment source | Source: Home / Self Care | Attending: Family Medicine | Admitting: Family Medicine

## 2012-08-20 DIAGNOSIS — M25569 Pain in unspecified knee: Secondary | ICD-10-CM

## 2012-08-20 DIAGNOSIS — G8929 Other chronic pain: Secondary | ICD-10-CM

## 2012-08-20 DIAGNOSIS — M25562 Pain in left knee: Secondary | ICD-10-CM

## 2012-08-20 MED ORDER — TRAMADOL HCL 50 MG PO TABS: 50.0000 mg | ORAL_TABLET | Freq: Three times a day (TID) | ORAL | Status: DC | PRN

## 2012-08-20 MED ORDER — IBUPROFEN 600 MG PO TABS: 600.0000 mg | ORAL_TABLET | Freq: Three times a day (TID) | ORAL | Status: DC | PRN

## 2012-08-20 NOTE — ED Notes (Signed)
Pt reports 1 1/2 week of bilateral knee pain.  She has recently started exercising and thinks that may have caused the pain.  She has tried ibuprofen, heat and ice without much relief

## 2012-08-21 NOTE — ED Provider Notes (Signed)
CSN: [id number]     Arrival date & time 08/20/12  1457 History     First MD Initiated Contact with Patient 08/20/12 1549     Chief Complaint  Patient presents with  . Knee Pain   (Consider location/radiation/quality/duration/timing/severity/associated sxs/prior Treatment) HPI Comments: 42 y/o female with h/o morbid obesity and chronic bilateral knee pain here c/o bilateral knee pain worse in left side for 1+1 1/2 week. Patient states she started a weight reduction exercise using a treadmill machine more than once a week for few weeks before pain exacerbated in her knees. Denies recent falls or direct injury. No swelling, redness or fever.    Past Medical History  Diagnosis Date  . Hypertension    Past Surgical History  Procedure Laterality Date  . Breast lumpectomy    . Tubal ligation     No family history on file. History  Substance Use Topics  . Smoking status: Never Smoker   . Smokeless tobacco: Not on file  . Alcohol Use: No   OB History   Grav Para Term Preterm Abortions TAB SAB Ect Mult Living                 Review of Systems  Constitutional: Negative for fever and chills.  Gastrointestinal: Negative for nausea, vomiting and abdominal pain.  Musculoskeletal:       Bilateral knee pain as per HPI  Skin: Negative for rash.  Neurological: Negative for weakness and numbness.  All other systems reviewed and are negative.    Allergies  Aspirin and Atenolol  Home Medications   Current Outpatient Rx  Name  Route  Sig  Dispense  Refill  . ibuprofen (ADVIL,MOTRIN) 600 MG tablet   Oral   Take 1 tablet (600 mg total) by mouth every 8 (eight) hours as needed for pain. Take with food   30 tablet   0   . traMADol (ULTRAM) 50 MG tablet   Oral   Take 1 tablet (50 mg total) by mouth every 8 (eight) hours as needed for pain.   20 tablet   0    BP 130/97  Pulse 79  Temp(Src) 98.4 F (36.9 C) (Oral)  Resp 18  SpO2 99%  LMP 08/15/2012 Physical Exam  Nursing  note and vitals reviewed. Constitutional: She is oriented to person, place, and time. No distress.  Morbid obesity  HENT:  Head: Normocephalic and atraumatic.  Cardiovascular: Normal heart sounds.   Pulmonary/Chest: Breath sounds normal.  Musculoskeletal:  Knee bilateral: No obvious swelling or deformity. No redness or increased temperature. No palpable effusion. Reported diffused pain with palpation over medial knees ilateral. Impress genu valgus.  But no obvious hyper laxity on stress valgo or varus. Bilateral crepitus fell with knee flexion and extension and also patient reported pain with active and passive movement. Negative drawer test. Do not feel Baker's cyst. Bilateral  lower extremities with normal superficial sensation. Also present dorsal pedal and tibial posterior pulses. Patient is weightbearing on both legs with no limp.  Neurological: She is alert and oriented to person, place, and time.  Skin: No rash noted. She is not diaphoretic.    ED Course   Procedures (including critical care time)  Labs Reviewed - No data to display No results found. 1. Bilateral chronic knee pain     MDM  Reccommended stop using treadmill, instead perform non weight bearing exercises for weight management like water aerobics, sitdown or floor exercises and healthy diet. Prescribed ibuprofen and tramadol. Instructed  and placed knee wrap.  Supportive care and red flags that should prompt return to medical attention discussed with patient and provided in writing.   Sharin Grave, MD 08/21/12 1031

## 2015-06-22 ENCOUNTER — Ambulatory Visit (HOSPITAL_COMMUNITY)
Admission: EM | Admit: 2015-06-22 | Discharge: 2015-06-22 | Disposition: A | Discharge status: Home / Self Care | Payer: No Typology Code available for payment source | Attending: Emergency Medicine | Admitting: Emergency Medicine

## 2015-06-22 ENCOUNTER — Encounter (HOSPITAL_COMMUNITY): Payer: Self-pay | Admitting: *Deleted

## 2015-06-22 DIAGNOSIS — J9801 Acute bronchospasm: Secondary | ICD-10-CM

## 2015-06-22 DIAGNOSIS — R0982 Postnasal drip: Secondary | ICD-10-CM | POA: Diagnosis not present

## 2015-06-22 DIAGNOSIS — J302 Other seasonal allergic rhinitis: Secondary | ICD-10-CM | POA: Diagnosis not present

## 2015-06-22 MED ORDER — PREDNISONE 20 MG PO TABS: ORAL_TABLET | ORAL | Status: DC

## 2015-06-22 MED ORDER — ALBUTEROL SULFATE HFA 108 (90 BASE) MCG/ACT IN AERS: 2.0000 | INHALATION_SPRAY | RESPIRATORY_TRACT | Status: DC | PRN

## 2015-06-22 NOTE — Discharge Instructions (Signed)
Allergic Rhinitis For nasal and head congestion may take Sudafed PE 10 mg every 4 hours as needed. Saline nasal spray used frequently. For drainage may use Allegra, Claritin or Zyrtec. If you need stronger medicine to stop drainage may take Chlor-Trimeton 2-4 mg every 4 hours. This may cause drowsiness. Ibuprofen 600 mg every 6 hours as needed for pain, discomfort or fever. Drink plenty of fluids and stay well-hydrated. Flonase or Rhinocort for a month Allergic rhinitis is when the mucous membranes in the nose respond to allergens. Allergens are particles in the air that cause your body to have an allergic reaction. This causes you to release allergic antibodies. Through a chain of events, these eventually cause you to release histamine into the blood stream. Although meant to protect the body, it is this release of histamine that causes your discomfort, such as frequent sneezing, congestion, and an itchy, runny nose.  CAUSES Seasonal allergic rhinitis (hay fever) is caused by pollen allergens that may come from grasses, trees, and weeds. Year-round allergic rhinitis (perennial allergic rhinitis) is caused by allergens such as house dust mites, pet dander, and mold spores. SYMPTOMS  Nasal stuffiness (congestion).  Itchy, runny nose with sneezing and tearing of the eyes. DIAGNOSIS Your health care provider can help you determine the allergen or allergens that trigger your symptoms. If you and your health care provider are unable to determine the allergen, skin or blood testing may be used. Your health care provider will diagnose your condition after taking your health history and performing a physical exam. Your health care provider may assess you for other related conditions, such as asthma, pink eye, or an ear infection. TREATMENT Allergic rhinitis does not have a cure, but it can be controlled by:  Medicines that block allergy symptoms. These may include allergy shots, nasal sprays, and oral  antihistamines.  Avoiding the allergen. Hay fever may often be treated with antihistamines in pill or nasal spray forms. Antihistamines block the effects of histamine. There are over-the-counter medicines that may help with nasal congestion and swelling around the eyes. Check with your health care provider before taking or giving this medicine. If avoiding the allergen or the medicine prescribed do not work, there are many new medicines your health care provider can prescribe. Stronger medicine may be used if initial measures are ineffective. Desensitizing injections can be used if medicine and avoidance does not work. Desensitization is when a patient is given ongoing shots until the body becomes less sensitive to the allergen. Make sure you follow up with your health care provider if problems continue. HOME CARE INSTRUCTIONS It is not possible to completely avoid allergens, but you can reduce your symptoms by taking steps to limit your exposure to them. It helps to know exactly what you are allergic to so that you can avoid your specific triggers. SEEK MEDICAL CARE IF:  You have a fever.  You develop a cough that does not stop easily (persistent).  You have shortness of breath.  You start wheezing.  Symptoms interfere with normal daily activities.   This information is not intended to replace advice given to you by your health care provider. Make sure you discuss any questions you have with your health care provider.   Document Released: 10/03/2000 Document Revised: 01/29/2014 Document Reviewed: 09/15/2012 Elsevier Interactive Patient Education 2016 [employer]  Bronchospasm, Adult A bronchospasm is when the tubes that carry air in and out of your lungs (airways) spasm or tighten. During a bronchospasm it is hard  to breathe. This is because the airways get smaller. A bronchospasm can be triggered by:  Allergies. These may be to animals, pollen, food, or mold.  Infection. This is a  common cause of bronchospasm.  Exercise.  Irritants. These include pollution, cigarette smoke, strong odors, aerosol sprays, and paint fumes.  Weather changes.  Stress.  Being emotional. HOME CARE   Always have a plan for getting help. Know when to call your doctor and local emergency services (911 in the [place] Know where you can get emergency care.  Only take medicines as told by your doctor.  If you were prescribed an inhaler or nebulizer machine, ask your doctor how to use it correctly. Always use a spacer with your inhaler if you were given one.  Stay calm during an attack. Try to relax and breathe more slowly.  Control your home environment:  Change your heating and air conditioning filter at least once a month.  Limit your use of fireplaces and wood stoves.  Do not  smoke. Do not  allow smoking in your home.  Avoid perfumes and fragrances.  Get rid of pests (such as roaches and mice) and their droppings.  Throw away plants if you see mold on them.  Keep your house clean and dust free.  Replace carpet with wood, tile, or vinyl flooring. Carpet can trap dander and dust.  Use allergy-proof pillows, mattress covers, and box spring covers.  Wash bed sheets and blankets every week in hot water. Dry them in a dryer.  Use blankets that are made of polyester or cotton.  Wash hands frequently. GET HELP IF:  You have muscle aches.  You have chest pain.  The thick spit you spit or cough up (sputum) changes from clear or white to yellow, green, gray, or bloody.  The thick spit you spit or cough up gets thicker.  There are problems that may be related to the medicine you are given such as:  A rash.  Itching.  Swelling.  Trouble breathing. GET HELP RIGHT AWAY IF:  You feel you cannot breathe or catch your breath.  You cannot stop coughing.  Your treatment is not helping you breathe better.  You have very bad chest pain. MAKE SURE YOU:   Understand  these instructions.  Will watch your condition.  Will get help right away if you are not doing well or get worse.   This information is not intended to replace advice given to you by your health care provider. Make sure you discuss any questions you have with your health care provider.   Document Released: 11/05/2008 Document Revised: 01/29/2014 Document Reviewed: 07/01/2012 Elsevier Interactive Patient Education [employer].

## 2015-06-22 NOTE — ED Notes (Signed)
Pt  Reports   Symptoms  Of  Cough   Congestion        Pain in chest  And  Back from   Coughing     Symptoms  X  1  Week     Pt  Reports     Ears  Popping  When  She  Swallows         Symptoms  Not  releived  By  [facility] meds

## 2015-06-22 NOTE — ED Provider Notes (Signed)
CSN: [id number]     Arrival date & time 06/22/15  1728 History   First MD Initiated Contact with Patient 06/22/15 1913     Chief Complaint  Patient presents with  . URI   (Consider location/radiation/quality/duration/timing/severity/associated sxs/prior Treatment) HPI Comments: 45 year old female complaining of cough, breast for congestion, occasional wheeze when supine, ears popping when swallowing, anterior chest pain and left back pain associated with cough only, runny nose, PND. Denies fever or chills. She has been taking Delsym, unknown type daytime cough and cold medicine" and Benadryl.  Patient is a 45 y.o. female presenting with URI.  URI Presenting symptoms: congestion, cough and rhinorrhea   Presenting symptoms: no fatigue, no fever and no sore throat   Associated symptoms: wheezing   Associated symptoms: no neck pain     Past Medical History  Diagnosis Date  . Hypertension    Past Surgical History  Procedure Laterality Date  . Breast lumpectomy    . Tubal ligation     History reviewed. No pertinent family history. Social History  Substance Use Topics  . Smoking status: Never Smoker   . Smokeless tobacco: None  . Alcohol Use: No   OB History    No data available     Review of Systems  Constitutional: Positive for activity change. Negative for fever, chills, appetite change and fatigue.  HENT: Positive for congestion, postnasal drip and rhinorrhea. Negative for facial swelling, sore throat and trouble swallowing.   Eyes: Negative.   Respiratory: Positive for cough and wheezing. Negative for shortness of breath.   Cardiovascular: Negative.        As per history of present illness left anterior chest pain and left flank pain associated with coughing only.  Gastrointestinal: Negative.   Musculoskeletal: Negative for neck pain and neck stiffness.  Skin: Negative for pallor and rash.  Neurological: Negative.     Allergies  Aspirin and Atenolol  Home  Medications   Prior to Admission medications   Medication Sig Start Date End Date Taking? Authorizing Provider  Dextromethorphan Polistirex (DELSYM PO) Take by mouth.   Yes Historical Provider, MD  albuterol (PROVENTIL HFA;VENTOLIN HFA) 108 (90 Base) MCG/ACT inhaler Inhale 2 puffs into the lungs every 4 (four) hours as needed for wheezing or shortness of breath. 06/22/15   Janne Napoleon, NP  ibuprofen (ADVIL,MOTRIN) 600 MG tablet Take 1 tablet (600 mg total) by mouth every 8 (eight) hours as needed for pain. Take with food 08/20/12   Adlih Moreno-Coll, MD  predniSONE (DELTASONE) 20 MG tablet Take 3 tabs po on first day, 2 tabs second day, 2 tabs third day, 1 tab fourth day, 1 tab 5th day. Take with food. 06/22/15   Janne Napoleon, NP   Meds Ordered and Administered this Visit  Medications - No data to display  BP 137/92 mmHg  Pulse 92  Temp(Src) 98.6 F (37 C) (Oral)  Resp 16  SpO2 97%  LMP 05/30/2015 No data found.   Physical Exam  Constitutional: She is oriented to person, place, and time. She appears well-developed and well-nourished. No distress.  HENT:  Mouth/Throat: No oropharyngeal exudate.  Bilateral TMs with minor retraction otherwise normal. Oropharynx with minor erythema and clear PND.  Eyes: Conjunctivae and EOM are normal.  Neck: Normal range of motion. Neck supple.  Cardiovascular: Normal rate, regular rhythm and normal heart sounds.   Pulmonary/Chest: Effort normal and breath sounds normal. No respiratory distress. She has no wheezes.  Musculoskeletal: Normal range of motion. She exhibits no  edema.  Lymphadenopathy:    She has no cervical adenopathy.  Neurological: She is alert and oriented to person, place, and time.  Skin: Skin is warm and dry. No rash noted.  Psychiatric: She has a normal mood and affect.  Nursing note and vitals reviewed.   ED Course  Procedures (including critical care time)  Labs Review Labs Reviewed - No data to display  Imaging Review No  results found.   Visual Acuity Review  Right Eye Distance:   Left Eye Distance:   Bilateral Distance:    Right Eye Near:   Left Eye Near:    Bilateral Near:         MDM   1. Other seasonal allergic rhinitis   2. PND (post-nasal drip)   3. Cough due to bronchospasm    Meds ordered this encounter  Medications  . Dextromethorphan Polistirex (DELSYM PO)    Sig: Take by mouth.  Marland Kitchen albuterol (PROVENTIL HFA;VENTOLIN HFA) 108 (90 Base) MCG/ACT inhaler    Sig: Inhale 2 puffs into the lungs every 4 (four) hours as needed for wheezing or shortness of breath.    Dispense:  1 Inhaler    Refill:  0    Order Specific Question:  Supervising Provider    Answer:  Melony Overly [id number]  . predniSONE (DELTASONE) 20 MG tablet    Sig: Take 3 tabs po on first day, 2 tabs second day, 2 tabs third day, 1 tab fourth day, 1 tab 5th day. Take with food.    Dispense:  9 tablet    Refill:  0    Order Specific Question:  Supervising Provider    Answer:  Melony Overly [id number]   For nasal and head congestion may take Sudafed PE 10 mg every 4 hours as needed. Saline nasal spray used frequently. For drainage may use Allegra, Claritin or Zyrtec. If you need stronger medicine to stop drainage may take Chlor-Trimeton 2-4 mg every 4 hours. This may cause drowsiness. Ibuprofen 600 mg every 6 hours as needed for pain, discomfort or fever. Drink plenty of fluids and stay well-hydrated. Flonase or Rhinocort for a month    Janne Napoleon, NP 06/22/15 1925

## 2015-07-04 ENCOUNTER — Emergency Department (HOSPITAL_COMMUNITY): Payer: BLUE CROSS/BLUE SHIELD

## 2015-07-04 ENCOUNTER — Encounter (HOSPITAL_COMMUNITY): Payer: Self-pay | Admitting: Emergency Medicine

## 2015-07-04 ENCOUNTER — Emergency Department (HOSPITAL_COMMUNITY)
Admission: EM | Admit: 2015-07-04 | Discharge: 2015-07-04 | Disposition: A | Discharge status: Home / Self Care | Payer: BLUE CROSS/BLUE SHIELD | Attending: Emergency Medicine | Admitting: Emergency Medicine

## 2015-07-04 DIAGNOSIS — S8992XA Unspecified injury of left lower leg, initial encounter: Secondary | ICD-10-CM | POA: Insufficient documentation

## 2015-07-04 DIAGNOSIS — S134XXA Sprain of ligaments of cervical spine, initial encounter: Secondary | ICD-10-CM | POA: Diagnosis not present

## 2015-07-04 DIAGNOSIS — Y92410 Unspecified street and highway as the place of occurrence of the external cause: Secondary | ICD-10-CM | POA: Insufficient documentation

## 2015-07-04 DIAGNOSIS — I10 Essential (primary) hypertension: Secondary | ICD-10-CM | POA: Diagnosis not present

## 2015-07-04 DIAGNOSIS — Y999 Unspecified external cause status: Secondary | ICD-10-CM | POA: Diagnosis not present

## 2015-07-04 DIAGNOSIS — Y939 Activity, unspecified: Secondary | ICD-10-CM | POA: Diagnosis not present

## 2015-07-04 DIAGNOSIS — M436 Torticollis: Secondary | ICD-10-CM

## 2015-07-04 DIAGNOSIS — R0789 Other chest pain: Secondary | ICD-10-CM | POA: Insufficient documentation

## 2015-07-04 MED ORDER — CYCLOBENZAPRINE HCL 10 MG PO TABS
10.0000 mg | ORAL_TABLET | Freq: Every day | ORAL | Status: DC
Start: 2015-07-04 — End: 2018-04-28
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
## 2016-10-04 DIAGNOSIS — F419 Anxiety disorder, unspecified: Secondary | ICD-10-CM | POA: Insufficient documentation

## 2016-10-04 DIAGNOSIS — F32A Depression, unspecified: Secondary | ICD-10-CM | POA: Insufficient documentation

## 2018-01-22 DIAGNOSIS — U071 COVID-19: Secondary | ICD-10-CM

## 2018-01-22 HISTORY — DX: COVID-19: U07.1

## 2018-04-28 ENCOUNTER — Ambulatory Visit (INDEPENDENT_AMBULATORY_CARE_PROVIDER_SITE_OTHER): Payer: Commercial Managed Care - PPO | Admitting: Obstetrics & Gynecology

## 2018-04-28 ENCOUNTER — Other Ambulatory Visit: Payer: Self-pay

## 2018-04-28 ENCOUNTER — Encounter: Payer: Self-pay | Admitting: Obstetrics & Gynecology

## 2018-04-28 ENCOUNTER — Other Ambulatory Visit (HOSPITAL_COMMUNITY)
Admission: RE | Admit: 2018-04-28 | Discharge: 2018-04-28 | Disposition: A | Discharge status: Home / Self Care | Payer: Commercial Managed Care - PPO | Source: Ambulatory Visit | Attending: Obstetrics & Gynecology | Admitting: Obstetrics & Gynecology

## 2018-04-28 VITALS — BP 136/94 | HR 87 | Ht 67.0 in | Wt 263.6 lb

## 2018-04-28 DIAGNOSIS — N938 Other specified abnormal uterine and vaginal bleeding: Secondary | ICD-10-CM | POA: Insufficient documentation

## 2018-04-28 DIAGNOSIS — N946 Dysmenorrhea, unspecified: Secondary | ICD-10-CM | POA: Insufficient documentation

## 2018-04-28 DIAGNOSIS — E669 Obesity, unspecified: Secondary | ICD-10-CM | POA: Insufficient documentation

## 2018-04-28 MED ORDER — MEGESTROL ACETATE 40 MG PO TABS: 40.0000 mg | ORAL_TABLET | Freq: Two times a day (BID) | ORAL | 5 refills | Status: DC

## 2018-04-28 NOTE — Progress Notes (Signed)
Ultrasound scheduled for Monday 05/05/18 at 1545.  Pt instructed to arrive at 1530.

## 2018-04-28 NOTE — Progress Notes (Signed)
   Subjective:    Patient ID: Kristine Kelly, female    DOB: 08-Apr-1970, 48 y.o.   MRN: [id number]  HPI  49 yo single P3 who went to [facility] today with heavy vaginal bleeding. She was sent directly here. She reports that she generally has a normal light period every month. But this month she has had 2 very heavy periods. She has had a BTL.  Review of Systems She reports a normal pap smear at Dr. Paulene Floor office 2 years ago. She gives a h/o cryo about 20 years ago.    Objective:   Physical Exam Breathing, conversing, and ambulating normally Well nourished, well hydrated Black female, no apparent distress Abd- benign, obese Speculum exam reveals a brisk amount of darkish blood Cervix is normal normal size and shape, anteverted, mobile, non-tender, normal adnexal exam Good pubic arch if vaginal hysterectomy is needed  Consent signed, time out done Cervix prepped with betadine and grasped with a single tooth tenaculum Uterus sounded to 9 cm Pipelle used for 2 passes with a moderate amount of tissue obtained. She tolerated the procedure well.     Assessment & Plan:  DUB- check TSH and CBC today Gyn u/s scheduled I have recommended that she sign up for MyChart Megace 40 mg BID prescribed

## 2018-04-29 ENCOUNTER — Other Ambulatory Visit: Payer: Self-pay | Admitting: Obstetrics & Gynecology

## 2018-04-29 ENCOUNTER — Encounter: Payer: Self-pay | Admitting: *Deleted

## 2018-04-29 LAB — CBC
Hematocrit: 38.6 % (ref 34.0–46.6)
Hemoglobin: 12.4 g/dL (ref 11.1–15.9)
MCH: 27.6 pg (ref 26.6–33.0)
MCHC: 32.1 g/dL (ref 31.5–35.7)
MCV: 86 fL (ref 79–97)
Platelets: 251 10*3/uL (ref 150–450)
RBC: 4.5 x10E6/uL (ref 3.77–5.28)
RDW: 13.7 % (ref 11.7–15.4)
WBC: 5.7 10*3/uL (ref 3.4–10.8)

## 2018-04-29 LAB — TSH: TSH: 2.18 u[IU]/mL (ref 0.450–4.500)

## 2018-05-05 ENCOUNTER — Other Ambulatory Visit (HOSPITAL_COMMUNITY): Payer: Commercial Managed Care - PPO

## 2018-05-05 ENCOUNTER — Ambulatory Visit (HOSPITAL_COMMUNITY): Payer: Commercial Managed Care - PPO

## 2018-05-07 ENCOUNTER — Ambulatory Visit (HOSPITAL_COMMUNITY)
Admission: RE | Admit: 2018-05-07 | Discharge: 2018-05-07 | Disposition: A | Discharge status: Home / Self Care | Payer: Commercial Managed Care - PPO | Source: Ambulatory Visit | Attending: Obstetrics & Gynecology | Admitting: Obstetrics & Gynecology

## 2018-05-07 ENCOUNTER — Other Ambulatory Visit: Payer: Self-pay

## 2018-05-07 DIAGNOSIS — N938 Other specified abnormal uterine and vaginal bleeding: Secondary | ICD-10-CM

## 2018-06-10 ENCOUNTER — Telehealth: Payer: Self-pay | Admitting: Student

## 2018-06-10 NOTE — Telephone Encounter (Signed)
Called the patient to inform of upcoming appointment. Left a detailed voicemail regarding the mychart visit.

## 2018-06-11 ENCOUNTER — Telehealth (INDEPENDENT_AMBULATORY_CARE_PROVIDER_SITE_OTHER): Payer: Commercial Managed Care - PPO | Admitting: Obstetrics & Gynecology

## 2018-06-11 DIAGNOSIS — N938 Other specified abnormal uterine and vaginal bleeding: Secondary | ICD-10-CM

## 2018-06-11 NOTE — Progress Notes (Signed)
I connected with  [name] on 06/11/18 at  3:35 PM EDT by telephone and mychart and verified that I am speaking with the correct person using two identifiers.   I discussed the limitations, risks, security and privacy concerns of performing an evaluation and management service by telephone and the availability of in person appointments. I also discussed with the patient that there may be a patient responsible charge related to this service. The patient expressed understanding and agreed to proceed.  Dolores Hoose, RN 06/11/2018  3:50 PM

## 2018-06-11 NOTE — Progress Notes (Signed)
    TELEHEALTH GYNECOLOGY VIRTUAL VIDEO VISIT ENCOUNTER NOTE  Provider location: [facility] [employer] at [facility]   I connected with Kristine Kelly on 06/11/18 at  3:35 PM EDT by WebEx Encounter at home and verified that I am speaking with the correct person using two identifiers.   I discussed the limitations, risks, security and privacy concerns of performing an evaluation and management service by telephone and the availability of in person appointments. I also discussed with the patient that there may be a patient responsible charge related to this service. The patient expressed understanding and agreed to proceed.   History:  Kristine Kelly is a 48 y.o. [phone] female being evaluated today for DUB. When I first saw her last month she was hemorrhaging. Her EMBX from that day was normal as was her HBG. Her u/s showed probable adenomyosis.     Past Medical History:  Diagnosis Date  . Hypertension    Past Surgical History:  Procedure Laterality Date  . BREAST LUMPECTOMY    . TUBAL LIGATION     The following portions of the patient's history were reviewed and updated as appropriate: allergies, current medications, past family history, past medical history, past social history, past surgical history and problem list.   Health Maintenance:  Normal pap with Dr. Willis Modena in 2018 along with mammogram  Review of Systems:  Pertinent items noted in HPI and remainder of comprehensive ROS otherwise negative.  Physical Exam:   General:  Alert, oriented and cooperative. Patient appears to be in no acute distress.  Mental Status: Normal mood and affect. Normal behavior. Normal judgment and thought content.   Respiratory: Normal respiratory effort, no problems with respiration noted  Rest of physical exam deferred due to type of encounter  Labs and Imaging No results found for this or any previous visit (from the past 336 hour(s)). No results found.     Assessment and Plan:    DUB- I offered Mirena/Liletta versus d&c and Minerva She opts for surgery I have sent Jordan a message to schedule this.      I discussed the assessment and treatment plan with the patient. The patient was provided an opportunity to ask questions and all were answered. The patient agreed with the plan and demonstrated an understanding of the instructions.   The patient was advised to call back or seek an in-person evaluation/go to the ED if the symptoms worsen or if the condition fails to improve as anticipated.  I provided 10 minutes of face-to-face time during this encounter.   Emily Filbert, MD [facility] [employer], [place]

## 2018-07-21 ENCOUNTER — Encounter (HOSPITAL_BASED_OUTPATIENT_CLINIC_OR_DEPARTMENT_OTHER): Payer: Self-pay | Admitting: *Deleted

## 2018-07-21 ENCOUNTER — Other Ambulatory Visit: Payer: Self-pay

## 2018-07-28 ENCOUNTER — Other Ambulatory Visit: Payer: Self-pay

## 2018-07-28 ENCOUNTER — Encounter (HOSPITAL_BASED_OUTPATIENT_CLINIC_OR_DEPARTMENT_OTHER)
Admission: RE | Admit: 2018-07-28 | Discharge: 2018-07-28 | Disposition: A | Discharge status: Home / Self Care | Payer: Commercial Managed Care - PPO | Source: Ambulatory Visit | Attending: Obstetrics & Gynecology | Admitting: Obstetrics & Gynecology

## 2018-07-28 ENCOUNTER — Other Ambulatory Visit (HOSPITAL_COMMUNITY)
Admission: RE | Admit: 2018-07-28 | Discharge: 2018-07-28 | Disposition: A | Discharge status: Home / Self Care | Payer: Commercial Managed Care - PPO | Source: Ambulatory Visit | Attending: Obstetrics & Gynecology | Admitting: Obstetrics & Gynecology

## 2018-07-28 DIAGNOSIS — Z01812 Encounter for preprocedural laboratory examination: Secondary | ICD-10-CM | POA: Insufficient documentation

## 2018-07-28 DIAGNOSIS — Z1159 Encounter for screening for other viral diseases: Secondary | ICD-10-CM | POA: Insufficient documentation

## 2018-07-28 DIAGNOSIS — N939 Abnormal uterine and vaginal bleeding, unspecified: Secondary | ICD-10-CM | POA: Insufficient documentation

## 2018-07-28 LAB — SARS CORONAVIRUS 2 (TAT 6-24 HRS): SARS Coronavirus 2: NEGATIVE

## 2018-07-28 LAB — POCT PREGNANCY, URINE: Preg Test, Ur: NEGATIVE

## 2018-07-28 NOTE — Progress Notes (Signed)
Ensure pre surgery drink given with instructions to complete by [place], pt verbalized understanding.

## 2018-07-30 ENCOUNTER — Ambulatory Visit (HOSPITAL_BASED_OUTPATIENT_CLINIC_OR_DEPARTMENT_OTHER): Payer: Commercial Managed Care - PPO | Admitting: Anesthesiology

## 2018-07-30 ENCOUNTER — Encounter (HOSPITAL_BASED_OUTPATIENT_CLINIC_OR_DEPARTMENT_OTHER): Payer: Self-pay

## 2018-07-30 ENCOUNTER — Other Ambulatory Visit: Payer: Self-pay

## 2018-07-30 ENCOUNTER — Encounter (HOSPITAL_BASED_OUTPATIENT_CLINIC_OR_DEPARTMENT_OTHER)
Admission: RE | Disposition: A | Discharge status: Home / Self Care | Payer: Self-pay | Source: Home / Self Care | Attending: Obstetrics & Gynecology

## 2018-07-30 ENCOUNTER — Ambulatory Visit (HOSPITAL_BASED_OUTPATIENT_CLINIC_OR_DEPARTMENT_OTHER)
Admission: RE | Admit: 2018-07-30 | Discharge: 2018-07-30 | Disposition: A | Discharge status: Home / Self Care | Payer: Commercial Managed Care - PPO | Attending: Obstetrics & Gynecology | Admitting: Obstetrics & Gynecology

## 2018-07-30 DIAGNOSIS — N920 Excessive and frequent menstruation with regular cycle: Secondary | ICD-10-CM | POA: Diagnosis not present

## 2018-07-30 DIAGNOSIS — N924 Excessive bleeding in the premenopausal period: Secondary | ICD-10-CM

## 2018-07-30 DIAGNOSIS — I10 Essential (primary) hypertension: Secondary | ICD-10-CM | POA: Diagnosis not present

## 2018-07-30 DIAGNOSIS — Z886 Allergy status to analgesic agent status: Secondary | ICD-10-CM | POA: Insufficient documentation

## 2018-07-30 DIAGNOSIS — R9389 Abnormal findings on diagnostic imaging of other specified body structures: Secondary | ICD-10-CM

## 2018-07-30 DIAGNOSIS — Z888 Allergy status to other drugs, medicaments and biological substances status: Secondary | ICD-10-CM | POA: Diagnosis not present

## 2018-07-30 DIAGNOSIS — N938 Other specified abnormal uterine and vaginal bleeding: Secondary | ICD-10-CM | POA: Diagnosis not present

## 2018-07-30 DIAGNOSIS — Z825 Family history of asthma and other chronic lower respiratory diseases: Secondary | ICD-10-CM | POA: Insufficient documentation

## 2018-07-30 DIAGNOSIS — Z79899 Other long term (current) drug therapy: Secondary | ICD-10-CM | POA: Diagnosis not present

## 2018-07-30 DIAGNOSIS — K219 Gastro-esophageal reflux disease without esophagitis: Secondary | ICD-10-CM | POA: Insufficient documentation

## 2018-07-30 HISTORY — PX: DILATATION AND CURETTAGE/HYSTEROSCOPY WITH MINERVA: SHX6851

## 2018-07-30 HISTORY — DX: Gastro-esophageal reflux disease without esophagitis: K21.9

## 2018-07-30 SURGERY — DILATATION AND CURETTAGE/HYSTEROSCOPY WITH MINERVA
Anesthesia: General | Site: Uterus

## 2018-07-30 MED ORDER — ONDANSETRON HCL 4 MG/2ML IJ SOLN
INTRAMUSCULAR | Status: AC
  Filled 2018-07-30: qty 2

## 2018-07-30 MED ORDER — SUCCINYLCHOLINE CHLORIDE 20 MG/ML IJ SOLN
INTRAMUSCULAR | Status: DC | PRN
  Administered 2018-07-30: 160 mg via INTRAVENOUS

## 2018-07-30 MED ORDER — BUPIVACAINE HCL (PF) 0.5 % IJ SOLN
INTRAMUSCULAR | Status: AC
  Filled 2018-07-30: qty 30

## 2018-07-30 MED ORDER — FENTANYL CITRATE (PF) 100 MCG/2ML IJ SOLN
50.0000 ug | INTRAMUSCULAR | Status: AC | PRN
  Administered 2018-07-30 (×2): 25 ug via INTRAVENOUS
  Administered 2018-07-30: 14:00:00 50 ug via INTRAVENOUS

## 2018-07-30 MED ORDER — GLYCOPYRROLATE 0.2 MG/ML IJ SOLN
INTRAMUSCULAR | Status: DC | PRN
  Administered 2018-07-30: 0.2 mg via INTRAVENOUS

## 2018-07-30 MED ORDER — DEXAMETHASONE SODIUM PHOSPHATE 10 MG/ML IJ SOLN
INTRAMUSCULAR | Status: AC
  Filled 2018-07-30: qty 1

## 2018-07-30 MED ORDER — FENTANYL CITRATE (PF) 100 MCG/2ML IJ SOLN
INTRAMUSCULAR | Status: AC
  Filled 2018-07-30: qty 2

## 2018-07-30 MED ORDER — MIDAZOLAM HCL 2 MG/2ML IJ SOLN
INTRAMUSCULAR | Status: AC
  Filled 2018-07-30: qty 2

## 2018-07-30 MED ORDER — DEXAMETHASONE SODIUM PHOSPHATE 4 MG/ML IJ SOLN
INTRAMUSCULAR | Status: DC | PRN
  Administered 2018-07-30: 5 mg via INTRAVENOUS

## 2018-07-30 MED ORDER — SUCCINYLCHOLINE CHLORIDE 200 MG/10ML IV SOSY
PREFILLED_SYRINGE | INTRAVENOUS | Status: AC
  Filled 2018-07-30: qty 10

## 2018-07-30 MED ORDER — LACTATED RINGERS IV SOLN
INTRAVENOUS | Status: DC
  Administered 2018-07-30: 13:00:00 via INTRAVENOUS

## 2018-07-30 MED ORDER — SILVER NITRATE-POT NITRATE 75-25 % EX MISC
CUTANEOUS | Status: DC | PRN
  Administered 2018-07-30: 1 via TOPICAL

## 2018-07-30 MED ORDER — BUPIVACAINE HCL (PF) 0.5 % IJ SOLN
INTRAMUSCULAR | Status: DC | PRN
  Administered 2018-07-30: 30 mL

## 2018-07-30 MED ORDER — SCOPOLAMINE 1 MG/3DAYS TD PT72: 1.0000 | MEDICATED_PATCH | Freq: Once | TRANSDERMAL | Status: DC

## 2018-07-30 MED ORDER — KETOROLAC TROMETHAMINE 30 MG/ML IJ SOLN
INTRAMUSCULAR | Status: DC | PRN
  Administered 2018-07-30: 30 mg via INTRAVENOUS

## 2018-07-30 MED ORDER — LIDOCAINE HCL (CARDIAC) PF 100 MG/5ML IV SOSY
PREFILLED_SYRINGE | INTRAVENOUS | Status: DC | PRN
  Administered 2018-07-30: 50 mg via INTRAVENOUS

## 2018-07-30 MED ORDER — ONDANSETRON HCL 4 MG/2ML IJ SOLN
INTRAMUSCULAR | Status: DC | PRN
  Administered 2018-07-30: 4 mg via INTRAVENOUS

## 2018-07-30 MED ORDER — PROPOFOL 10 MG/ML IV BOLUS
INTRAVENOUS | Status: AC
  Filled 2018-07-30: qty 20

## 2018-07-30 MED ORDER — GLYCOPYRROLATE PF 0.2 MG/ML IJ SOSY
PREFILLED_SYRINGE | INTRAMUSCULAR | Status: AC
  Filled 2018-07-30: qty 1

## 2018-07-30 MED ORDER — PROPOFOL 10 MG/ML IV BOLUS
INTRAVENOUS | Status: DC | PRN
  Administered 2018-07-30: 200 mg via INTRAVENOUS

## 2018-07-30 MED ORDER — MIDAZOLAM HCL 2 MG/2ML IJ SOLN
1.0000 mg | INTRAMUSCULAR | Status: DC | PRN
  Administered 2018-07-30: 2 mg via INTRAVENOUS

## 2018-07-30 MED ORDER — IBUPROFEN 600 MG PO TABS: 600.0000 mg | ORAL_TABLET | Freq: Four times a day (QID) | ORAL | 1 refills | Status: DC | PRN

## 2018-07-30 MED ORDER — LIDOCAINE 2% (20 MG/ML) 5 ML SYRINGE
INTRAMUSCULAR | Status: AC
  Filled 2018-07-30: qty 5

## 2018-07-30 SURGICAL SUPPLY — 24 items
BIPOLAR CUTTING LOOP 21FR (ELECTRODE)
BRIEF STRETCH FOR OB PAD XXL (UNDERPADS AND DIAPERS) ×4 IMPLANT
CANISTER SUCT 3000ML PPV (MISCELLANEOUS) ×2 IMPLANT
DILATOR CANAL MILEX (MISCELLANEOUS) IMPLANT
ELECT REM PT RETURN 9FT ADLT (ELECTROSURGICAL)
ELECTRODE REM PT RTRN 9FT ADLT (ELECTROSURGICAL) IMPLANT
GLOVE BIO SURGEON STRL SZ 6.5 (GLOVE) ×3 IMPLANT
GLOVE BIO SURGEONS STRL SZ 6.5 (GLOVE) ×1
GLOVE BIOGEL PI IND STRL 7.0 (GLOVE) ×2 IMPLANT
GLOVE BIOGEL PI INDICATOR 7.0 (GLOVE) ×2
GOWN STRL REUS W/ TWL XL LVL3 (GOWN DISPOSABLE) ×2 IMPLANT
GOWN STRL REUS W/TWL LRG LVL3 (GOWN DISPOSABLE) ×4 IMPLANT
GOWN STRL REUS W/TWL XL LVL3 (GOWN DISPOSABLE) ×4
HANDPIECE ABLA MINERVA ENDO (MISCELLANEOUS) ×2 IMPLANT
KIT PROCEDURE FLUENT (KITS) ×4 IMPLANT
LOOP CUTTING BIPOLAR 21FR (ELECTRODE) IMPLANT
NDL SPNL 18GX3.5 QUINCKE PK (NEEDLE) IMPLANT
NEEDLE SPNL 18GX3.5 QUINCKE PK (NEEDLE) IMPLANT
PACK VAGINAL MINOR WOMEN LF (CUSTOM PROCEDURE TRAY) ×4 IMPLANT
PAD OB MATERNITY 4.3X12.25 (PERSONAL CARE ITEMS) ×4 IMPLANT
PAD PREP 24X48 CUFFED NSTRL (MISCELLANEOUS) ×4 IMPLANT
SEAL ROD LENS SCOPE MYOSURE (ABLATOR) IMPLANT
SLEEVE SCD COMPRESS KNEE MED (MISCELLANEOUS) ×4 IMPLANT
TOWEL GREEN STERILE FF (TOWEL DISPOSABLE) ×8 IMPLANT

## 2018-07-30 NOTE — Op Note (Signed)
07/30/2018  3:14 PM  PATIENT:  Kristine Kelly  48 y.o. female  PRE-OPERATIVE DIAGNOSIS:  AUB  POST-OPERATIVE DIAGNOSIS:  AUB  PROCEDURE:  Procedure(s): DILATATION & CURETTAGE WITH Minerva Ablation (N/A)  SURGEON:  Surgeon(s) and Role:    * Kemya Shed C, MD - Primary  ANESTHESIA:   local and general  EBL:  minimal  BLOOD ADMINISTERED:none  DRAINS: none   LOCAL MEDICATIONS USED:  MARCAINE     SPECIMEN:  Source of Specimen:  endometrial curettings  DISPOSITION OF SPECIMEN:  PATHOLOGY  COUNTS:  YES  TOURNIQUET:  * No tourniquets in log *  DICTATION: .Dragon Dictation  PLAN OF CARE: Discharge to home after PACU  PATIENT DISPOSITION:  PACU - hemodynamically stable.   Delay start of Pharmacological VTE agent (>24hrs) due to surgical blood loss or risk of bleeding: not applicable  The risks, benefits, and alternatives of surgery were explained, understood, and accepted. All questions were answered. Consents were signed. In the operating room general anesthesia was applied without complication, and she was placed in the dorsal lithotomy position. Her vagina was prepped and draped in the usual sterile fashion.  A bimanual exam revealed an  normal size and shape, anteverted mobile uterus. Her adnexa were nonenlarged.  A speculum was placed and a single-tooth tenaculum was used to grasp the anterior lip of her cervix. A total of 30 mL of 0.5% Marcaine was used to perform a paracervical block. Her uterus sounded to 9 cm and her cervical length was 4 cm. Her cervix was carefully and slowly dilated to accommodate a small curette. A curettage was done in all quadrants and the fundus of the uterus. A small amount of polypoid-type  tissue was obtained.  I then proceeded with the Minerva procedure. The procedure proceeded as expected. There was no bleeding noted at the end of the case. She was taken to the recovery room after being extubated. She tolerated the procedure well.

## 2018-07-30 NOTE — Anesthesia Procedure Notes (Signed)
Procedure Name: Intubation Date/Time: 07/30/2018 2:30 PM Performed by: Bufford Spikes, CRNA Pre-anesthesia Checklist: Patient identified, Emergency Drugs available, Suction available and Patient being monitored Patient Re-evaluated:Patient Re-evaluated prior to induction Oxygen Delivery Method: Circle system utilized Preoxygenation: Pre-oxygenation with 100% oxygen Induction Type: IV induction Ventilation: Mask ventilation without difficulty Laryngoscope Size: Miller and 2 Grade View: Grade II Tube type: Oral Tube size: 7.0 mm Number of attempts: 1 Airway Equipment and Method: Stylet and Oral airway Placement Confirmation: ETT inserted through vocal cords under direct vision,  positive ETCO2 and breath sounds checked- equal and bilateral Secured at: 21 cm Tube secured with: Tape Dental Injury: Teeth and Oropharynx as per pre-operative assessment

## 2018-07-30 NOTE — Discharge Instructions (Signed)
Dilation and Curettage or Vacuum Curettage, Care After This sheet gives you information about how to care for yourself after your procedure. Your health care provider may also give you more specific instructions. If you have problems or questions, contact your health care provider. What can I expect after the procedure? After your procedure, it is common to have:  Mild pain or cramping.  Some vaginal bleeding or spotting. These may last for up to 2 weeks after your procedure. Follow these instructions at home: Activity   Do not drive or use heavy machinery while taking prescription pain medicine.  Avoid driving for the first 24 hours after your procedure.  Take frequent, short walks, followed by rest periods, throughout the day. Ask your health care provider what activities are safe for you. After 1-2 days, you may be able to return to your normal activities.  Do not lift anything heavier than 10 lb (4.5 kg) until your health care provider approves.  For at least 2 weeks, or as long as told by your health care provider, do not: ? Douche. ? Use tampons. ? Have sexual intercourse. General instructions   Take over-the-counter and prescription medicines only as told by your health care provider. This is especially important if you take blood thinning medicine.  Do not take baths, swim, or use a hot tub until your health care provider approves. Take showers instead of baths.  Wear compression stockings as told by your health care provider. These stockings help to prevent blood clots and reduce swelling in your legs.  It is your responsibility to get the results of your procedure. Ask your health care provider, or the department performing the procedure, when your results will be ready.  Keep all follow-up visits as told by your health care provider. This is important. Contact a health care provider if:  You have severe cramps that get worse or that do not get better with  medicine.  You have severe abdominal pain.  You cannot drink fluids without vomiting.  You develop pain in a different area of your pelvis.  You have bad-smelling vaginal discharge.  You have a rash. Get help right away if:  You have vaginal bleeding that soaks more than one sanitary pad in 1 hour, for 2 hours in a row.  You pass large blood clots from your vagina.  You have a fever that is above 100.52F (38.0C).  Your abdomen feels very tender or hard.  You have chest pain.  You have shortness of breath.  You cough up blood.  You feel dizzy or light-headed.  You faint.  You have pain in your neck or shoulder area. This information is not intended to replace advice given to you by your health care provider. Make sure you discuss any questions you have with your health care provider. Document Released: 01/06/2000 Document Revised: 12/21/2016 Document Reviewed: 08/11/2015   Post Anesthesia Home Care Instructions  Activity: Get plenty of rest for the remainder of the day. A responsible individual must stay with you for 24 hours following the procedure.  For the next 24 hours, DO NOT: -Drive a car -Paediatric nurse -Drink alcoholic beverages -Take any medication unless instructed by your physician -Make any legal decisions or sign important papers.  Meals: Start with liquid foods such as gelatin or soup. Progress to regular foods as tolerated. Avoid greasy, spicy, heavy foods. If nausea and/or vomiting occur, drink only clear liquids until the nausea and/or vomiting subsides. Call your physician if vomiting continues.  Special Instructions/Symptoms: Your throat may feel dry or sore from the anesthesia or the breathing tube placed in your throat during surgery. If this causes discomfort, gargle with warm salt water. The discomfort should disappear within 24 hours.  If you had a scopolamine patch placed behind your ear for the management of post- operative nausea  and/or vomiting:  1. The medication in the patch is effective for 72 hours, after which it should be removed.  Wrap patch in a tissue and discard in the trash. Wash hands thoroughly with soap and water. 2. You may remove the patch earlier than 72 hours if you experience unpleasant side effects which may include dry mouth, dizziness or visual disturbances. 3. Avoid touching the patch. Wash your hands with soap and water after contact with the patch.    No Ibuprofen until 9:00 pm on 07/30/2018

## 2018-07-30 NOTE — H&P (Signed)
.  Kristine Kelly is a 48 y.o. [phone] female being treated today for DUB. When I first saw her several months she was hemorrhaging. Her EMBX from that day was normal as was her HBG. Her u/s showed probable adenomyosis and a 5 mm endometrium. She was treated with megace which has helped.  She wants an uterine ablation.   No LMP recorded.    Past Medical History:  Diagnosis Date  . GERD (gastroesophageal reflux disease)   . Hypertension    not currently on medications    Past Surgical History:  Procedure Laterality Date  . BREAST LUMPECTOMY    . TUBAL LIGATION      Family History  Problem Relation Age of Onset  . Asthma Mother     Social History:  reports that she has never smoked. She has never used smokeless tobacco. She reports that she does not drink alcohol or use drugs.  Allergies:  Allergies  Allergen Reactions  . Aspirin   . Atenolol     Medications Prior to Admission  Medication Sig Dispense Refill Last Dose  . megestrol (MEGACE) 40 MG tablet Take 1 tablet (40 mg total) by mouth 2 (two) times daily. 60 tablet 5 Past Month at Unknown time  . albuterol (PROVENTIL HFA;VENTOLIN HFA) 108 (90 Base) MCG/ACT inhaler Inhale 2 puffs into the lungs every 4 (four) hours as needed for wheezing or shortness of breath. 1 Inhaler 0 Unknown at Unknown time    ROS  She had a normal pap smear at Dr. Paulene Floor office in the last 2 years.  Blood pressure (!) 144/87, pulse (!) 118, temperature 98.4 F (36.9 C), temperature source Oral, resp. rate 20, height 5\' 7"  (1.702 m), weight 119.4 kg, SpO2 100 %. Physical Exam    No results found for this or any previous visit (from the past 24 hour(s)).  No results found.  Assessment/Plan: Menorrhagia/DUB- plan for d&c and Minerva endometrial ablation.  She understands the risks of surgery, including, but not to infection, bleeding, DVTs, damage to bowel, bladder, ureters. She wishes to proceed. I have quoted her a 90% satisfaction  rate and at least 50% amenorrhea rate. She wishes to proceed.  Nayan Proch C Huber Mathers 07/30/2018, 1:50 PM

## 2018-07-30 NOTE — Anesthesia Preprocedure Evaluation (Signed)
Anesthesia Evaluation  Patient identified by MRN, date of birth, ID band Patient awake    Reviewed: Allergy & Precautions, NPO status , Patient's Chart, lab work & pertinent test results  Airway Mallampati: II  TM Distance: >3 FB Neck ROM: Full    Dental no notable dental hx.    Pulmonary neg pulmonary ROS,    Pulmonary exam normal breath sounds clear to auscultation       Cardiovascular hypertension, negative cardio ROS Normal cardiovascular exam Rhythm:Regular Rate:Normal     Neuro/Psych negative neurological ROS  negative psych ROS   GI/Hepatic Neg liver ROS, GERD  Poorly Controlled,  Endo/Other  negative endocrine ROS  Renal/GU negative Renal ROS  negative genitourinary   Musculoskeletal negative musculoskeletal ROS (+)   Abdominal   Peds negative pediatric ROS (+)  Hematology negative hematology ROS (+)   Anesthesia Other Findings   Reproductive/Obstetrics negative OB ROS                             Anesthesia Physical Anesthesia Plan  ASA: II  Anesthesia Plan: General   Post-op Pain Management:    Induction: Intravenous, Rapid sequence and Cricoid pressure planned  PONV Risk Score and Plan: 4 or greater and Ondansetron, Dexamethasone, Midazolam and Treatment may vary due to age or medical condition  Airway Management Planned: Oral ETT  Additional Equipment:   Intra-op Plan:   Post-operative Plan: Extubation in OR  Informed Consent: I have reviewed the patients History and Physical, chart, labs and discussed the procedure including the risks, benefits and alternatives for the proposed anesthesia with the patient or authorized representative who has indicated his/her understanding and acceptance.     Dental advisory given  Plan Discussed with: CRNA  Anesthesia Plan Comments:         Anesthesia Quick Evaluation

## 2018-07-30 NOTE — Transfer of Care (Signed)
Immediate Anesthesia Transfer of Care Note  Patient: Kristine Kelly  Procedure(s) Performed: DILATATION & CURETTAGE/HYSTEROSCOPY WITH Minerva Ablation (N/A Vagina )  Patient Location: PACU  Anesthesia Type:General  Level of Consciousness: awake, alert , oriented and patient cooperative  Airway & Oxygen Therapy: Patient Spontanous Breathing and Patient connected to nasal cannula oxygen  Post-op Assessment: Report given to RN and Post -op Vital signs reviewed and stable  Post vital signs: Reviewed and stable  Last Vitals:  Vitals Value Taken Time  BP    Temp    Pulse 99 07/30/18 1505  Resp    SpO2 99 % 07/30/18 1505  Vitals shown include unvalidated device data.  Last Pain:  Vitals:   07/30/18 1249  TempSrc: Oral  PainSc: 0-No pain         Complications: No apparent anesthesia complications

## 2018-07-31 ENCOUNTER — Encounter (HOSPITAL_BASED_OUTPATIENT_CLINIC_OR_DEPARTMENT_OTHER): Payer: Self-pay | Admitting: Obstetrics & Gynecology

## 2018-07-31 NOTE — Anesthesia Postprocedure Evaluation (Signed)
Anesthesia Post Note  Patient: Najat H Munce  Procedure(s) Performed: DILATATION & CURETTAGE WITH Minerva Ablation (N/A Vagina )     Patient location during evaluation: PACU Anesthesia Type: General Level of consciousness: awake and alert Pain management: pain level controlled Vital Signs Assessment: post-procedure vital signs reviewed and stable Respiratory status: spontaneous breathing, nonlabored ventilation, respiratory function stable and patient connected to nasal cannula oxygen Cardiovascular status: blood pressure returned to baseline and stable Postop Assessment: no apparent nausea or vomiting Anesthetic complications: no    Last Vitals:  Vitals:   07/30/18 1530 07/30/18 1600  BP: (!) 129/94 (!) 143/92  Pulse: 89 86  Resp: 14 16  Temp:  36.6 C  SpO2: 100% 95%    Last Pain:  Vitals:   07/30/18 1600  TempSrc:   PainSc: 0-No pain                 Montez Hageman

## 2018-08-06 ENCOUNTER — Ambulatory Visit: Payer: Commercial Managed Care - PPO | Admitting: Obstetrics & Gynecology

## 2018-08-06 ENCOUNTER — Telehealth: Payer: Self-pay | Admitting: Lactation Services

## 2018-08-06 NOTE — Telephone Encounter (Signed)
Attempted to call pt to inform her that Dr. Hulan Fray can see her today at 1:35. Pt did not answer. LM for her to check her MyChart message and to call the office about an appt.

## 2018-08-07 ENCOUNTER — Ambulatory Visit: Payer: Commercial Managed Care - PPO | Admitting: Obstetrics & Gynecology

## 2018-08-07 ENCOUNTER — Other Ambulatory Visit: Payer: Self-pay

## 2018-08-07 VITALS — BP 134/93 | HR 91 | Temp 98.0°F | Wt 263.2 lb

## 2018-08-07 DIAGNOSIS — R350 Frequency of micturition: Secondary | ICD-10-CM

## 2018-08-07 DIAGNOSIS — Z9889 Other specified postprocedural states: Secondary | ICD-10-CM

## 2018-08-07 NOTE — Progress Notes (Signed)
   Subjective:    Patient ID: Kristine Kelly, female    DOB: [birth year], 48 y.o.   MRN: [id number]  HPI 48 yo lady here for a post op check. She had a d&c and Minerva ablation on 07-30-18. She is having a watery discharge, denies any problems. She reports that she is having to pee more often, denies dysuria.  Review of Systems Pathology was benign   Objective:   Physical Exam Breathing, conversing, and ambulating normally Well nourished, well hydrated Black female, no apparent distress Spec exam- normal, watery discharge c/w h/o endometrial ablation normal size and shape, anteverted, mobile, non-tender, normal adnexal exam      Assessment & Plan:  Post op- doing well Come back for virtual visit in 2 months

## 2018-10-08 ENCOUNTER — Other Ambulatory Visit: Payer: Self-pay

## 2018-10-08 ENCOUNTER — Telehealth (INDEPENDENT_AMBULATORY_CARE_PROVIDER_SITE_OTHER): Payer: Commercial Managed Care - PPO | Admitting: Obstetrics & Gynecology

## 2018-10-08 DIAGNOSIS — Z9889 Other specified postprocedural states: Secondary | ICD-10-CM | POA: Diagnosis not present

## 2018-10-08 NOTE — Progress Notes (Signed)
I connected with  Kristine Kelly on 10/08/18 at  1:15 PM EDT by telephone and verified that I am speaking with the correct person using two identifiers.   I discussed the limitations, risks, security and privacy concerns of performing an evaluation and management service by telephone and the availability of in person appointments. I also discussed with the patient that there may be a patient responsible charge related to this service. The patient expressed understanding and agreed to proceed.  Verdell Carmine, RN 10/08/2018  1:21 PM

## 2018-10-08 NOTE — Progress Notes (Signed)
    TELEHEALTH GYNECOLOGY VIRTUAL VIDEO VISIT ENCOUNTER NOTE  Provider location: [facility] [employer] at [facility]   I connected with [employer] on 10/08/18 at  1:15 PM EDT by MyChart Video Encounter at home and verified that I am speaking with the correct person using two identifiers.   I discussed the limitations, risks, security and privacy concerns of performing an evaluation and management service virtually and the availability of in person appointments. I also discussed with the patient that there may be a patient responsible charge related to this service. The patient expressed understanding and agreed to proceed.   History:  Kristine Kelly is a 48 y.o. [phone] female being evaluated today for a post op visit. She had a d&c and Minerva endometrial ablation on 07/30/18.   She reports that she continues to have bleeding. 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 She is willing to try an IUD.   Past Medical History:  Diagnosis Date  . GERD (gastroesophageal reflux disease)   . Hypertension    not currently on medications   Past Surgical History:  Procedure Laterality Date  . BREAST LUMPECTOMY    . DILATATION AND CURETTAGE/HYSTEROSCOPY WITH MINERVA N/A 07/30/2018   Procedure: DILATATION AND CURETTAGE/HYSTEROSCOPY WITH MINERVA;  Surgeon: Emily Filbert, MD;  Location: [place];  Service: Gynecology;  Laterality: N/A;  . TUBAL LIGATION     The following portions of the patient's history were reviewed and updated as appropriate: allergies, current medications, past family history, past medical history, past social history, past surgical history and problem list.    Review of Systems:  Pertinent items noted in HPI and remainder of comprehensive ROS otherwise negative.  Physical Exam:   General:  Alert, oriented and cooperative. Patient appears to be in no acute distress.  Mental Status: Normal mood and  affect. Normal behavior. Normal judgment and thought content.   Respiratory: Normal respiratory effort, no problems with respiration noted  Rest of physical exam deferred due to type of encounter  Labs and Imaging No results found for this or any previous visit (from the past 336 hour(s)). No results found.     Assessment and Plan:   Post op state- s/p Minerva with d&c   Continued DUB- she will try an IUD but will wait a few a months to see if the bleeding improves.   I discussed the assessment and treatment plan with the patient. The patient was provided an opportunity to ask questions and all were answered. The patient agreed with the plan and demonstrated an understanding of the instructions.   The patient was advised to call back or seek an in-person evaluation/go to the ED if the symptoms worsen or if the condition fails to improve as anticipated.  I provided 10 minutes of face-to-face time during this encounter.   Emily Filbert, MD [facility], [place]

## 2018-10-16 ENCOUNTER — Other Ambulatory Visit: Payer: Self-pay | Admitting: Obstetrics & Gynecology

## 2018-10-16 MED ORDER — IBUPROFEN 800 MG PO TABS: 800.0000 mg | ORAL_TABLET | Freq: Three times a day (TID) | ORAL | 1 refills | Status: DC | PRN

## 2018-10-16 NOTE — Progress Notes (Signed)
ibu 800 mg prescribed per patient request.

## 2019-05-10 ENCOUNTER — Encounter: Payer: Self-pay | Admitting: Emergency Medicine

## 2019-05-10 ENCOUNTER — Ambulatory Visit
Admission: EM | Admit: 2019-05-10 | Discharge: 2019-05-10 | Disposition: A | Discharge status: Home / Self Care | Payer: Commercial Managed Care - PPO | Attending: Physician Assistant | Admitting: Physician Assistant

## 2019-05-10 ENCOUNTER — Other Ambulatory Visit
Admission: RE | Admit: 2019-05-10 | Discharge: 2019-05-10 | Disposition: A | Discharge status: Home / Self Care | Payer: Commercial Managed Care - PPO | Source: Ambulatory Visit | Attending: Physician Assistant | Admitting: Physician Assistant

## 2019-05-10 ENCOUNTER — Other Ambulatory Visit: Payer: Self-pay

## 2019-05-10 DIAGNOSIS — R531 Weakness: Secondary | ICD-10-CM | POA: Insufficient documentation

## 2019-05-10 DIAGNOSIS — U071 COVID-19: Secondary | ICD-10-CM | POA: Diagnosis present

## 2019-05-10 DIAGNOSIS — R5383 Other fatigue: Secondary | ICD-10-CM

## 2019-05-10 LAB — BASIC METABOLIC PANEL
Anion gap: 11 (ref 5–15)
BUN: 12 mg/dL (ref 6–20)
CO2: 22 mmol/L (ref 22–32)
Calcium: 8.4 mg/dL — ABNORMAL LOW (ref 8.9–10.3)
Chloride: 101 mmol/L (ref 98–111)
Creatinine, Ser: 0.92 mg/dL (ref 0.44–1.00)
GFR calc Af Amer: >60 mL/min (ref >60)
GFR calc non Af Amer: >60 mL/min (ref >60)
Glucose, Bld: 90 mg/dL (ref 70–99)
Potassium: 3.7 mmol/L (ref 3.5–5.1)
Sodium: 134 mmol/L — ABNORMAL LOW (ref 135–145)

## 2019-05-10 LAB — CBC WITH DIFFERENTIAL/PLATELET
Abs Immature Granulocytes: 0.13 10*3/uL — ABNORMAL HIGH (ref 0.00–0.07)
Basophils Absolute: 0 10*3/uL (ref 0.0–0.1)
Basophils Relative: 0 %
Eosinophils Absolute: 0 10*3/uL (ref 0.0–0.5)
Eosinophils Relative: 0 %
HCT: 41.3 % (ref 36.0–46.0)
Hemoglobin: 14.1 g/dL (ref 12.0–15.0)
Immature Granulocytes: 2 %
Lymphocytes Relative: 27 %
Lymphs Abs: 2.3 10*3/uL (ref 0.7–4.0)
MCH: 28.5 pg (ref 26.0–34.0)
MCHC: 34.1 g/dL (ref 30.0–36.0)
MCV: 83.6 fL (ref 80.0–100.0)
Monocytes Absolute: 0.8 10*3/uL (ref 0.1–1.0)
Monocytes Relative: 9 %
Neutro Abs: 5.1 10*3/uL (ref 1.7–7.7)
Neutrophils Relative %: 62 %
Platelets: 215 10*3/uL (ref 150–400)
RBC: 4.94 MIL/uL (ref 3.87–5.11)
RDW: 13.2 % (ref 11.5–15.5)
WBC: 8.3 10*3/uL (ref 4.0–10.5)
nRBC: 0 % (ref 0.0–0.2)

## 2019-05-10 LAB — POC SARS CORONAVIRUS 2 AG -  ED: SARS Coronavirus 2 Ag: POSITIVE — AB

## 2019-05-10 MED ORDER — BENZONATATE 100 MG PO CAPS: 100.0000 mg | ORAL_CAPSULE | Freq: Three times a day (TID) | ORAL | 0 refills | Status: DC

## 2019-05-10 MED ORDER — ONDANSETRON 4 MG PO TBDP: 4.0000 mg | ORAL_TABLET | Freq: Three times a day (TID) | ORAL | 0 refills | Status: DC | PRN

## 2019-05-10 MED ORDER — DICYCLOMINE HCL 20 MG PO TABS: 20.0000 mg | ORAL_TABLET | Freq: Two times a day (BID) | ORAL | 0 refills | Status: DC

## 2019-05-10 MED ORDER — FLUTICASONE PROPIONATE 50 MCG/ACT NA SUSP: 2.0000 | Freq: Every day | NASAL | 0 refills | Status: DC

## 2019-05-10 NOTE — ED Provider Notes (Signed)
[facility]    CSN: [id number] Arrival date & time: 05/10/19  1519      History   Chief Complaint Chief Complaint  Patient presents with  . Weakness  . Nausea  . Cough  . Generalized Body Aches    HPI Kristine Kelly is a 49 y.o. female.   49 year old female comes in for 10 day history of nausea, diarrhea, body aches, chills, cough. She also have fatigue and weakness. States these symptoms started a day after J&J vaccine. She was seen 1 week ago for same at a different urgent care, and was given prednisone, azithromycin, albuterol. States prior to this, she had shortness of breath, which resolved with medicine. Her cough is nonproductive. Denies chest pain. 2 episode of diarrhea per day. Some abdominal pain that she associates with her cycle. Denies vomiting. Denies fever. Denies loss of taste/smell.      Past Medical History:  Diagnosis Date  . GERD (gastroesophageal reflux disease)   . Hypertension    not currently on medications    Patient Active Problem List   Diagnosis Date Noted  . DUB (dysfunctional uterine bleeding) 04/28/2018  . Morbid obesity ([place]) 04/28/2018    Past Surgical History:  Procedure Laterality Date  . BREAST LUMPECTOMY    . DILATATION AND CURETTAGE/HYSTEROSCOPY WITH MINERVA N/A 07/30/2018   Procedure: DILATATION AND CURETTAGE/HYSTEROSCOPY WITH MINERVA;  Surgeon: Emily Filbert, MD;  Location: [facility];  Service: Gynecology;  Laterality: N/A;  . TUBAL LIGATION      OB History    Gravida  4   Para  3   Term  3   Preterm      AB  1   Living  3     SAB      TAB  1   Ectopic      Multiple      Live Births  3            Home Medications    Prior to Admission medications   Medication Sig Start Date End Date Taking? Authorizing Provider  albuterol (PROVENTIL HFA;VENTOLIN HFA) 108 (90 Base) MCG/ACT inhaler Inhale 2 puffs into the lungs every 4 (four) hours as needed for wheezing or shortness of  breath. 06/22/15   Janne Napoleon, NP  benzonatate (TESSALON) 100 MG capsule Take 1 capsule (100 mg total) by mouth every 8 (eight) hours. 05/10/19   Tasia Catchings, Amy V, PA-C  dicyclomine (BENTYL) 20 MG tablet Take 1 tablet (20 mg total) by mouth 2 (two) times daily. 05/10/19   Tasia Catchings, Amy V, PA-C  fluticasone (FLONASE) 50 MCG/ACT nasal spray Place 2 sprays into both nostrils daily. 05/10/19   Tasia Catchings, Amy V, PA-C  ondansetron (ZOFRAN ODT) 4 MG disintegrating tablet Take 1 tablet (4 mg total) by mouth every 8 (eight) hours as needed for nausea or vomiting. 05/10/19   Ok Edwards, PA-C    Family History Family History  Problem Relation Age of Onset  . Asthma Mother     Social History Social History   Tobacco Use  . Smoking status: Never Smoker  . Smokeless tobacco: Never Used  Substance Use Topics  . Alcohol use: No  . Drug use: No     Allergies   Aspirin and Atenolol   Review of Systems Review of Systems  Reason unable to perform ROS: See HPI as above.     Physical Exam Triage Vital Signs ED Triage Vitals  Enc Vitals Group  BP 05/10/19 1554 (!) 148/90     Pulse Rate 05/10/19 1554 (!) 116     Resp 05/10/19 1554 20     Temp 05/10/19 1554 100 F (37.8 C)     Temp Source 05/10/19 1554 Oral     SpO2 05/10/19 1554 94 %     Weight --      Height --      Head Circumference --      Peak Flow --      Pain Score 05/10/19 1555 0     Pain Loc --      Pain Edu? --      Excl. in [place]? --    Orthostatic VS for the past 24 hrs:  BP- Lying Pulse- Lying BP- Sitting Pulse- Sitting BP- Standing at 0 minutes Pulse- Standing at 0 minutes  05/10/19 1629 110/78 112 120/84 120 (!) 127/91 129    Updated Vital Signs BP (!) 148/90 (BP Location: Left Arm)   Pulse (!) 116   Temp 99.5 F (37.5 C) (Oral)   Resp 20   LMP 05/08/2019   SpO2 95%   Visual Acuity Right Eye Distance:   Left Eye Distance:   Bilateral Distance:    Right Eye Near:   Left Eye Near:    Bilateral Near:     Physical Exam  Constitutional:      General: She is not in acute distress.    Appearance: Normal appearance. She is not ill-appearing, toxic-appearing or diaphoretic.  HENT:     Head: Normocephalic and atraumatic.     Right Ear: Tympanic membrane, ear canal and external ear normal.     Left Ear: Tympanic membrane, ear canal and external ear normal.     Nose:     Right Sinus: No maxillary sinus tenderness or frontal sinus tenderness.     Left Sinus: No maxillary sinus tenderness or frontal sinus tenderness.     Mouth/Throat:     Mouth: Mucous membranes are moist.     Pharynx: Oropharynx is clear. Uvula midline.  Cardiovascular:     Rate and Rhythm: Regular rhythm. Tachycardia present.     Heart sounds: Normal heart sounds. No murmur. No friction rub. No gallop.   Pulmonary:     Effort: Pulmonary effort is normal. No accessory muscle usage, prolonged expiration, respiratory distress or retractions.     Comments: Lungs clear to auscultation without adventitious lung sounds. Abdominal:     General: Bowel sounds are normal.     Palpations: Abdomen is soft.     Tenderness: There is no abdominal tenderness. There is no guarding or rebound.  Musculoskeletal:     Cervical back: Normal range of motion and neck supple.  Skin:    General: Skin is warm and dry.  Neurological:     Mental Status: She is alert and oriented to person, place, and time.     Comments: Able to ambulate on own without difficulty      UC Treatments / Results  Labs (all labs ordered are listed, but only abnormal results are displayed) Labs Reviewed  POC SARS CORONAVIRUS 2 AG -  ED - Abnormal; Notable for the following components:      Result Value   SARS Coronavirus 2 Ag Positive (*)    All other components within normal limits  CBC WITH DIFFERENTIAL/PLATELET  BASIC METABOLIC PANEL    EKG   Radiology No results found.  Procedures Procedures (including critical care time)  Medications Ordered in UC  Medications - No  data to display  Initial Impression / Assessment and Plan / UC Course  I have reviewed the triage vital signs and the nursing notes.  Pertinent labs & imaging results that were available during my care of the patient were reviewed by me and considered in my medical decision making (see chart for details).    Rapid COVID positive. Patient tachycardic, negative orthostatics. She is nontoxic, afebrile, without tachypnea, hypoxia. LCTAB, recently finished prednisone, azithromycin. Will draw CBC, BMP for further evaluation of fatigue and weakness. Otherwise, symptomatic management at this time. Push fluids. Strict return precautions given. Patient expresses understanding and agrees to plan.  Final Clinical Impressions(s) / UC Diagnoses   Final diagnoses:  Weakness  Fatigue, unspecified type  COVID-19   ED Prescriptions    Medication Sig Dispense Auth. Provider   dicyclomine (BENTYL) 20 MG tablet Take 1 tablet (20 mg total) by mouth 2 (two) times daily. 20 tablet Yu, Amy V, PA-C   fluticasone (FLONASE) 50 MCG/ACT nasal spray Place 2 sprays into both nostrils daily. 1 g Yu, Amy V, PA-C   benzonatate (TESSALON) 100 MG capsule Take 1 capsule (100 mg total) by mouth every 8 (eight) hours. 21 capsule Yu, Amy V, PA-C   ondansetron (ZOFRAN ODT) 4 MG disintegrating tablet Take 1 tablet (4 mg total) by mouth every 8 (eight) hours as needed for nausea or vomiting. 20 tablet Ok Edwards, PA-C     PDMP not reviewed this encounter.   Ok Edwards, PA-C 05/10/19 1827

## 2019-05-10 NOTE — Discharge Instructions (Addendum)
Rapid COVID positive. We have drawn labs for continued monitoring. Start bentyl as directed. Zofran as needed for nausea/vomiting. Tessalon for cough. Flonase for congestion. Keep hydrated, urine should be clear to pale yellow in color. Please continue to quarantine until symptoms improving. If any worsening symptoms, please go to the emergency department for further evaluation needed.

## 2019-05-10 NOTE — ED Triage Notes (Signed)
Pt got the johnson and johnson shot about 10 days ago.  She has felt bad since.  N/V/D, body aches, chills, weakness, cough.

## 2019-05-11 ENCOUNTER — Telehealth: Payer: Self-pay | Admitting: Unknown Physician Specialty

## 2019-05-11 ENCOUNTER — Telehealth: Payer: Self-pay | Admitting: Physician Assistant

## 2019-05-11 LAB — CBC WITH DIFFERENTIAL/PLATELET
Basophils Absolute: 0 10*3/uL (ref 0.0–0.1)
Basos: 0 %
EOS (ABSOLUTE): 0 10*3/uL (ref 0.0–0.5)
Eos: 0 %
Hematocrit: 41.3 % (ref 36.0–46.0)
Hemoglobin: 14.1 g/dL (ref 12.0–15.0)
Immature Grans (Abs): 0.13 10*3/uL — ABNORMAL HIGH (ref 0.00–0.07)
Lymphocytes Absolute: 2.3 10*3/uL (ref 0.7–4.0)
Lymphs: 27 %
MCH: 28.5 pg (ref 26.0–34.0)
MCHC: 34.1 g/dL (ref 30.0–36.0)
MCV: 83.6 fL (ref 80.0–100.0)
Monocytes Absolute: 0.8 10*3/uL (ref 0.1–1.0)
Monocytes: 9 %
Neutrophils Absolute: 5.1 10*3/uL (ref 1.7–7.7)
Neutrophils: 62 %
Platelets: 215 10*3/uL (ref 150–400)
RBC: 4.94 MIL/uL (ref 3.87–5.11)
RDW: 13.2 % (ref 11.5–15.5)
WBC: 8.3 10*3/uL (ref 4.0–10.5)

## 2019-05-11 LAB — BASIC METABOLIC PANEL
BUN: 12 mg/dL (ref 6–20)
CO2: 22 mmol/L (ref 22–32)
Calcium: 8.4 mg/dL — ABNORMAL LOW (ref 8.9–10.3)
Chloride: 101 mmol/L (ref 98–111)
Creatinine, Ser: 0.92 mg/dL (ref 0.44–1.00)
GFR calc Af Amer: >60 mL/min (ref >60)
GFR calc non Af Amer: >60 mL/min (ref >60)
Glucose: 90 mg/dL (ref 70–99)
Potassium: 3.7 mmol/L (ref 3.5–5.1)
Sodium: 134 mmol/L — ABNORMAL LOW (ref 135–145)

## 2019-05-11 NOTE — Telephone Encounter (Signed)
Called to discuss with Kristine Kelly about Covid symptoms and the use of bamlanivimab, a monoclonal antibody infusion for those with mild to moderate Covid symptoms and at a high risk of hospitalization.    Pt does not qualify for infusion therapy as her symptoms first presented > 10 days prior to timing of infusion. Symptoms tier reviewed as well as criteria for ending isolation. Preventative practices reviewed. Patient verbalized understanding    Patient Active Problem List   Diagnosis Date Noted  . DUB (dysfunctional uterine bleeding) 04/28/2018  . Morbid obesity ([place]) 04/28/2018

## 2019-05-11 NOTE — Telephone Encounter (Signed)
Called to discuss with patient about Covid symptoms and the use of bamlanivimab/etesevimab or casirivimab/imdevimab, a monoclonal antibody infusion for those with mild to moderate Covid symptoms and at a high risk of hospitalization.  Pt is qualified for this infusion at the [facility] infusion center due to [employer] left to call back and sent mychart message  Angelena Form PA-C  [facility]

## 2019-05-14 ENCOUNTER — Other Ambulatory Visit: Payer: Self-pay

## 2019-05-14 ENCOUNTER — Emergency Department (HOSPITAL_COMMUNITY): Payer: Commercial Managed Care - PPO

## 2019-05-14 ENCOUNTER — Emergency Department (HOSPITAL_COMMUNITY)
Admission: EM | Admit: 2019-05-14 | Discharge: 2019-05-14 | Disposition: A | Discharge status: Home / Self Care | Payer: Commercial Managed Care - PPO | Attending: Emergency Medicine | Admitting: Emergency Medicine

## 2019-05-14 ENCOUNTER — Encounter (HOSPITAL_COMMUNITY): Payer: Self-pay

## 2019-05-14 DIAGNOSIS — J069 Acute upper respiratory infection, unspecified: Secondary | ICD-10-CM | POA: Diagnosis not present

## 2019-05-14 DIAGNOSIS — I712 Thoracic aortic aneurysm, without rupture, unspecified: Secondary | ICD-10-CM

## 2019-05-14 DIAGNOSIS — F419 Anxiety disorder, unspecified: Secondary | ICD-10-CM | POA: Insufficient documentation

## 2019-05-14 DIAGNOSIS — Z79899 Other long term (current) drug therapy: Secondary | ICD-10-CM | POA: Diagnosis not present

## 2019-05-14 DIAGNOSIS — U071 COVID-19: Secondary | ICD-10-CM | POA: Diagnosis not present

## 2019-05-14 DIAGNOSIS — R0602 Shortness of breath: Secondary | ICD-10-CM | POA: Diagnosis present

## 2019-05-14 HISTORY — DX: COVID-19: U07.1

## 2019-05-14 LAB — BASIC METABOLIC PANEL
Anion gap: 13 (ref 5–15)
BUN: 9 mg/dL (ref 6–20)
CO2: 21 mmol/L — ABNORMAL LOW (ref 22–32)
Calcium: 8.9 mg/dL (ref 8.9–10.3)
Chloride: 102 mmol/L (ref 98–111)
Creatinine, Ser: 0.9 mg/dL (ref 0.44–1.00)
GFR calc Af Amer: >60 mL/min (ref >60)
GFR calc non Af Amer: >60 mL/min (ref >60)
Glucose, Bld: 170 mg/dL — ABNORMAL HIGH (ref 70–99)
Potassium: 3.6 mmol/L (ref 3.5–5.1)
Sodium: 136 mmol/L (ref 135–145)

## 2019-05-14 LAB — TROPONIN I (HIGH SENSITIVITY)
Troponin I (High Sensitivity): 3 ng/L (ref <18)
Troponin I (High Sensitivity): 5 ng/L (ref <18)

## 2019-05-14 LAB — CBC WITH DIFFERENTIAL/PLATELET
Abs Immature Granulocytes: 0.06 10*3/uL (ref 0.00–0.07)
Basophils Absolute: 0 10*3/uL (ref 0.0–0.1)
Basophils Relative: 0 %
Eosinophils Absolute: 0 10*3/uL (ref 0.0–0.5)
Eosinophils Relative: 0 %
HCT: 43.9 % (ref 36.0–46.0)
Hemoglobin: 13.9 g/dL (ref 12.0–15.0)
Immature Granulocytes: 1 %
Lymphocytes Relative: 39 %
Lymphs Abs: 2.7 10*3/uL (ref 0.7–4.0)
MCH: 27.8 pg (ref 26.0–34.0)
MCHC: 31.7 g/dL (ref 30.0–36.0)
MCV: 87.8 fL (ref 80.0–100.0)
Monocytes Absolute: 0.5 10*3/uL (ref 0.1–1.0)
Monocytes Relative: 8 %
Neutro Abs: 3.6 10*3/uL (ref 1.7–7.7)
Neutrophils Relative %: 52 %
Platelets: 237 10*3/uL (ref 150–400)
RBC: 5 MIL/uL (ref 3.87–5.11)
RDW: 12.8 % (ref 11.5–15.5)
WBC: 6.9 10*3/uL (ref 4.0–10.5)
nRBC: 0 % (ref 0.0–0.2)

## 2019-05-14 LAB — D-DIMER, QUANTITATIVE: D-Dimer, Quant: 1.72 ug/mL-FEU — ABNORMAL HIGH (ref 0.00–0.50)

## 2019-05-14 LAB — POC URINE PREG, ED: Preg Test, Ur: NEGATIVE

## 2019-05-14 MED ORDER — PANTOPRAZOLE SODIUM 40 MG PO TBEC
40.0000 mg | DELAYED_RELEASE_TABLET | Freq: Once | ORAL | Status: AC
  Administered 2019-05-14: 40 mg via ORAL
  Filled 2019-05-14: qty 1

## 2019-05-14 MED ORDER — OMEPRAZOLE 20 MG PO CPDR: 20.0000 mg | DELAYED_RELEASE_CAPSULE | Freq: Two times a day (BID) | ORAL | 0 refills | Status: DC

## 2019-05-14 MED ORDER — HYDROXYZINE HCL 25 MG PO TABS
25.0000 mg | ORAL_TABLET | Freq: Once | ORAL | Status: AC
  Administered 2019-05-14: 25 mg via ORAL
  Filled 2019-05-14: qty 1

## 2019-05-14 MED ORDER — IOHEXOL 350 MG/ML SOLN
80.0000 mL | Freq: Once | INTRAVENOUS | Status: AC | PRN
  Administered 2019-05-14: 80 mL via INTRAVENOUS

## 2019-05-14 MED ORDER — HYDROXYZINE HCL 25 MG PO TABS: 25.0000 mg | ORAL_TABLET | Freq: Four times a day (QID) | ORAL | 0 refills | Status: DC | PRN

## 2019-05-14 NOTE — Discharge Instructions (Addendum)
You have recently been diagnosed with COVID-19 infection. Follow instruction below.  Take hydroxyzine as needed for anxiety. Take prilosec for heart burn. Return if you have any concerns.   Your CT scan did not show any evidence of a blood clot today.  It did show signs of Covid pneumonia which we see very commonly, but her oxygen saturations and vitals look good today.  If you develop worsening shortness of breath or chest discomfort return to the emergency department.  You can purchase a home pulse ox which you can use to measure your oxygen at home, if this level is consistently below 90-92 you should immediately return to the ED.  The Covid hospital at home program will visit you tomorrow morning to help see how you are doing and monitor your symptoms.  Please let your primary care provider know that your CT scan today shows a 4.3 cm ascending thoracic aortic aneurysm. Recommend annual imaging followup by CTA or MRA.  If you ever develop severe tearing chest pain that radiates to your back you should immediately come to the emergency department.

## 2019-05-14 NOTE — ED Notes (Signed)
Patient verbalizes understanding of discharge instructions. Opportunity for questioning and answers were provided. Armband removed by staff, pt discharged from ED.  

## 2019-05-14 NOTE — ED Notes (Signed)
Ambulated with pulse ox, O2 sats remained above 94%

## 2019-05-14 NOTE — ED Provider Notes (Signed)
Care assumed from PA [place] at shift change, please see his note for full details, but in brief Kristine Kelly is a 49 y.o. female who was diagnosed with Covid 4 days ago, had received her [employer] vaccine on 4/8, presenting today feeling anxious and having heartburn.  She states that for the past 4 days she has had generalized fatigue, body aches, chills, cough and has overall felt malaised.  She tested positive for Covid after the symptoms began.  This morning she woke up feeling very anxious and thought she was having a panic attack.  Describes heartburn, with a discomfort in her chest.  Her symptoms have improved but not fully resolved.  Endorses mild shortness of breath initially.  Work-up so far has been reassuring sinus tachycardia on arrival but no other EKG changes, negative troponins x2, no leukocytosis, normal hemoglobin, glucose of 170 but no other significant electrolyte derangements.  D-dimer was elevated, chest x-ray is clear, pending CTA to rule out PE.  Patient received Protonix and hydroxyzine with significant improvement in her symptoms.  Plan: Follow-up on CTA, if normal anticipate discharge home with hydroxyzine, and Protonix, may also benefit from albuterol inhaler.  She has normal O2 saturations, and no oxygen requirement.  BP 133/75   Pulse 83   Temp 98.1 F (36.7 C) (Oral)   Resp 15   LMP 05/08/2019   SpO2 99%    ED Course/Procedures   Labs Reviewed  BASIC METABOLIC PANEL - Abnormal; Notable for the following components:      Result Value   CO2 21 (*)    Glucose, Bld 170 (*)    All other components within normal limits  D-DIMER, QUANTITATIVE (NOT AT [facility]) - Abnormal; Notable for the following components:   D-Dimer, Quant 1.72 (*)    All other components within normal limits  CBC WITH DIFFERENTIAL/PLATELET  POC URINE PREG, ED  TROPONIN I (HIGH SENSITIVITY)  TROPONIN I (HIGH SENSITIVITY)   EKG Interpretation  Date/Time:  Thursday May 14 2019 10:09:59  EDT Ventricular Rate:  119 PR Interval:  148 QRS Duration: 76 QT Interval:  314 QTC Calculation: 441 R Axis:   59 Text Interpretation: Sinus tachycardia with occasional Premature ventricular complexes Nonspecific ST abnormality Abnormal ECG When compared to prior, faster rate No sTEMI Confirmed by Antony Blackbird [phone]) on 05/14/2019 2:08:51 PM  CT Angio Chest PE W and/or Wo Contrast  Result Date: 05/14/2019 CLINICAL DATA:  Palpitations, anxiety, weakness, shortness of breath, COVID-19 positive EXAM: CT ANGIOGRAPHY CHEST WITH CONTRAST TECHNIQUE: Multidetector CT imaging of the chest was performed using the standard protocol during bolus administration of intravenous contrast. Multiplanar CT image reconstructions and MIPs were obtained to evaluate the vascular anatomy. CONTRAST:  22mL OMNIPAQUE IOHEXOL 350 MG/ML SOLN COMPARISON:  05/14/2019 FINDINGS: Cardiovascular: This is a technically adequate evaluation of the pulmonary vasculature. No filling defects or pulmonary emboli. The heart is not enlarged.  No pericardial effusion. Aneurysmal dilatation of the ascending thoracic aorta measuring 4.3 x 4.0 cm. No evidence of dissection. Mediastinum/Nodes: No enlarged mediastinal, hilar, or axillary lymph nodes. Thyroid gland, trachea, and esophagus demonstrate no significant findings. Lungs/Pleura: There is minimal bilateral ground-glass subpleural airspace disease, consistent with multifocal pneumonia. No effusion or pneumothorax. The central airways are patent. Upper Abdomen: No acute abnormality. Musculoskeletal: No acute or destructive bony lesions. Reconstructed images demonstrate no additional findings. Review of the MIP images confirms the above findings. IMPRESSION: 1. No evidence of pulmonary embolus. 2. 4.3 cm ascending thoracic aortic aneurysm.  Recommend annual imaging followup by CTA or MRA. This recommendation follows 2010 ACCF/AHA/AATS/ACR/ASA/SCA/SCAI/SIR/STS/SVM Guidelines for the Diagnosis and  Management of Patients with Thoracic Aortic Disease. Circulation. 2010; [id number]. Aortic aneurysm NOS (ICD10-I71.9) 3. Minimal bilateral ground-glass airspace disease consistent with multifocal pneumonia, compatible with history of COVID-19. Electronically Signed   By: Randa Ngo M.D.   On: 05/14/2019 16:25   DG Chest Portable 1 View  Result Date: 05/14/2019 CLINICAL DATA:  Chest pain. EXAM: PORTABLE CHEST 1 VIEW COMPARISON:  July 04, 2015. FINDINGS: The heart size and mediastinal contours are within normal limits. Both lungs are clear. No pneumothorax or pleural effusion is noted. The visualized skeletal structures are unremarkable. IMPRESSION: No active disease. Electronically Signed   By: Marijo Conception M.D.   On: 05/14/2019 11:07     Procedures  MDM   CTA returned with no evidence of pulmonary embolism, there are some groundglass opacities noted consistent with patient's COVID-19 infection but she ambulated here in the department and maintained normal O2 sats, she is noted to have a 4.3 cm thoracic aortic aneurysm, no evidence of dissection.  Recommend yearly follow-up with imaging.  I discussed CT results including new finding of thoracic aortic aneurysm with patient.  She expresses understanding and agreement.  I think that patient would be an excellent candidate for the Covid hospital at home program, and she is in agreement with this as well.  They will contact her this evening to set up home visit for tomorrow.  At this time I feel she is stable for discharge home with continued supportive treatment of her symptoms.  Final diagnoses:  Acute respiratory disease due to COVID-19 virus  Anxiety  Thoracic aortic aneurysm without rupture [facility])    ED Discharge Orders         Ordered    hydrOXYzine (ATARAX/VISTARIL) 25 MG tablet  Every 6 hours PRN     05/14/19 1526    omeprazole (PRILOSEC) 20 MG capsule  2 times daily before meals     05/14/19 1526                Benedetto Goad [place], [place] 05/14/19 1750    Lucrezia Starch, MD 05/15/19 2240

## 2019-05-14 NOTE — ED Provider Notes (Signed)
[facility] EMERGENCY DEPARTMENT Provider Note   CSN: [id number] Arrival date & time: 05/14/19  1010     History Chief Complaint  Patient presents with  . Anxiety  . Covid Exposure  . Shortness of Breath    Kristine Kelly is a 49 y.o. female.  The history is provided by the patient and medical records. No language interpreter was used.  Anxiety Associated symptoms include shortness of breath.  Shortness of Breath    49 year old obese female with history of hypertension, GERD, recently diagnosed with COVID-19 approximately 4 days ago as well as receiving [employer] vaccine on 4/8 presenting complaining of feeling anxious and having heartburn.  Patient states for the past 4 days she has had generalized fatigue, body aches, chills, cough, and overall not feeling well.  States she was tested positive for COVID-19 after her symptoms started.  This morning she woke up feeling very anxious, feels as if she is having a panic attack, and endorsed having heartburn with a discomfort sensation in her chest.  Her symptom has since improved but not fully resolved.  She did endorse mild shortness of breath initially which also improved.  She denies tobacco use.  No loss of taste or smell no nausea vomiting diarrhea no abdominal pain or rash.  Past Medical History:  Diagnosis Date  . COVID-19   . GERD (gastroesophageal reflux disease)   . Hypertension    not currently on medications    Patient Active Problem List   Diagnosis Date Noted  . DUB (dysfunctional uterine bleeding) 04/28/2018  . Morbid obesity ([place]) 04/28/2018    Past Surgical History:  Procedure Laterality Date  . BREAST LUMPECTOMY    . DILATATION AND CURETTAGE/HYSTEROSCOPY WITH MINERVA N/A 07/30/2018   Procedure: DILATATION AND CURETTAGE/HYSTEROSCOPY WITH MINERVA;  Surgeon: Emily Filbert, MD;  Location: [facility];  Service: Gynecology;  Laterality: N/A;  . TUBAL LIGATION       OB  History    Gravida  4   Para  3   Term  3   Preterm      AB  1   Living  3     SAB      TAB  1   Ectopic      Multiple      Live Births  3           Family History  Problem Relation Age of Onset  . Asthma Mother     Social History   Tobacco Use  . Smoking status: Never Smoker  . Smokeless tobacco: Never Used  Substance Use Topics  . Alcohol use: No  . Drug use: No    Home Medications Prior to Admission medications   Medication Sig Start Date End Date Taking? Authorizing Provider  albuterol (PROVENTIL HFA;VENTOLIN HFA) 108 (90 Base) MCG/ACT inhaler Inhale 2 puffs into the lungs every 4 (four) hours as needed for wheezing or shortness of breath. 06/22/15   Janne Napoleon, NP  benzonatate (TESSALON) 100 MG capsule Take 1 capsule (100 mg total) by mouth every 8 (eight) hours. 05/10/19   Tasia Catchings, Amy V, PA-C  dicyclomine (BENTYL) 20 MG tablet Take 1 tablet (20 mg total) by mouth 2 (two) times daily. 05/10/19   Tasia Catchings, Amy V, PA-C  fluticasone (FLONASE) 50 MCG/ACT nasal spray Place 2 sprays into both nostrils daily. 05/10/19   Tasia Catchings, Amy V, PA-C  ondansetron (ZOFRAN ODT) 4 MG disintegrating tablet Take 1 tablet (4 mg  total) by mouth every 8 (eight) hours as needed for nausea or vomiting. 05/10/19   Tasia Catchings, Amy V, PA-C    Allergies    Aspirin and Atenolol  Review of Systems   Review of Systems  Respiratory: Positive for shortness of breath.   All other systems reviewed and are negative.   Physical Exam Updated Vital Signs BP (!) 146/90   Pulse (!) 114   Temp 98.1 F (36.7 C) (Oral)   Resp (!) 22   LMP 05/08/2019   SpO2 100%   Physical Exam Vitals and nursing note reviewed.  Constitutional:      General: She is not in acute distress.    Appearance: She is well-developed. She is obese.  HENT:     Head: Atraumatic.  Eyes:     Conjunctiva/sclera: Conjunctivae normal.  Cardiovascular:     Rate and Rhythm: Tachycardia present.  Pulmonary:     Effort: Pulmonary effort  is normal.     Breath sounds: Normal breath sounds. No decreased breath sounds, wheezing, rhonchi or rales.  Chest:     Chest wall: No tenderness.  Abdominal:     Palpations: Abdomen is soft.     Tenderness: There is no abdominal tenderness.  Musculoskeletal:     Cervical back: Neck supple.     Right lower leg: No edema.     Left lower leg: No edema.  Skin:    Findings: No rash.  Neurological:     Mental Status: She is alert.  Psychiatric:        Mood and Affect: Mood normal.     ED Results / Procedures / Treatments   Labs (all labs ordered are listed, but only abnormal results are displayed) Labs Reviewed  BASIC METABOLIC PANEL - Abnormal; Notable for the following components:      Result Value   CO2 21 (*)    Glucose, Bld 170 (*)    All other components within normal limits  D-DIMER, QUANTITATIVE (NOT AT [facility]) - Abnormal; Notable for the following components:   D-Dimer, Quant 1.72 (*)    All other components within normal limits  CBC WITH DIFFERENTIAL/PLATELET  POC URINE PREG, ED  TROPONIN I (HIGH SENSITIVITY)  TROPONIN I (HIGH SENSITIVITY)    EKG EKG Interpretation  Date/Time:  Thursday May 14 2019 10:09:59 EDT Ventricular Rate:  119 PR Interval:  148 QRS Duration: 76 QT Interval:  314 QTC Calculation: 441 R Axis:   59 Text Interpretation: Sinus tachycardia with occasional Premature ventricular complexes Nonspecific ST abnormality Abnormal ECG When compared to prior, faster rate No sTEMI Confirmed by Antony Blackbird [phone]) on 05/14/2019 2:08:51 PM   Radiology DG Chest Portable 1 View  Result Date: 05/14/2019 CLINICAL DATA:  Chest pain. EXAM: PORTABLE CHEST 1 VIEW COMPARISON:  July 04, 2015. FINDINGS: The heart size and mediastinal contours are within normal limits. Both lungs are clear. No pneumothorax or pleural effusion is noted. The visualized skeletal structures are unremarkable. IMPRESSION: No active disease. Electronically Signed   By: Marijo Conception  M.D.   On: 05/14/2019 11:07    Procedures Procedures (including critical care time)  Medications Ordered in ED Medications  pantoprazole (PROTONIX) EC tablet 40 mg (40 mg Oral Given 05/14/19 1341)  hydrOXYzine (ATARAX/VISTARIL) tablet 25 mg (25 mg Oral Given 05/14/19 1341)    ED Course  I have reviewed the triage vital signs and the nursing notes.  Pertinent labs & imaging results that were available during my care of the patient  were reviewed by me and considered in my medical decision making (see chart for details).    MDM Rules/Calculators/A&P                      BP 133/75   Pulse 83   Temp 98.1 F (36.7 C) (Oral)   Resp 15   LMP 05/08/2019   SpO2 99%   Final Clinical Impression(s) / ED Diagnoses Final diagnoses:  Acute respiratory disease due to COVID-19 virus    Rx / DC Orders ED Discharge Orders         Ordered    hydrOXYzine (ATARAX/VISTARIL) 25 MG tablet  Every 6 hours PRN     05/14/19 1526    omeprazole (PRILOSEC) 20 MG capsule  2 times daily before meals     05/14/19 1526         1:19 PM Patient complaints of having a panic attack as well as having indigestion earlier today.  Mild shortness of breath which is since improved.  Was diagnosed with COVID-19 4 days ago.  No evidence of hypoxia at this time however in the setting of increasing risk of potential PE, will obtain D-dimer.  Patient given Protonix for heartburn as well as hydroxyzine for anxiety.  3:43 PM Pt sign out to oncoming provider who will f/u on Chest CTA result.  If negative, can d/c home.   Kristine Kelly was evaluated in Emergency Department on 05/14/2019 for the symptoms described in the history of present illness. She was evaluated in the context of the global COVID-19 pandemic, which necessitated consideration that the patient might be at risk for infection with the SARS-CoV-2 virus that causes COVID-19. Institutional protocols and algorithms that pertain to the evaluation of patients at  risk for COVID-19 are in a state of rapid change based on information released by regulatory bodies including the CDC and federal and state organizations. These policies and algorithms were followed during the patient's care in the ED.    Domenic Moras, PA-C 05/14/19 1543    Tegeler, Gwenyth Allegra, MD 05/14/19 [phone]

## 2019-05-14 NOTE — ED Triage Notes (Signed)
Pt presents with feeling anxious, palpitations, weakness and SOB starting today. Pt received [employer] vaccine 4/8, then dx w/covid 18th  Pt in no distress in triage. Pt states "can yall help me, I feel anxious"

## 2019-12-16 ENCOUNTER — Ambulatory Visit
Admission: EM | Admit: 2019-12-16 | Discharge: 2019-12-16 | Disposition: A | Discharge status: Home / Self Care | Payer: Commercial Managed Care - PPO | Attending: Family Medicine | Admitting: Family Medicine

## 2019-12-16 ENCOUNTER — Other Ambulatory Visit: Payer: Self-pay

## 2019-12-16 DIAGNOSIS — E860 Dehydration: Secondary | ICD-10-CM | POA: Diagnosis not present

## 2019-12-16 DIAGNOSIS — K529 Noninfective gastroenteritis and colitis, unspecified: Secondary | ICD-10-CM

## 2019-12-16 MED ORDER — ONDANSETRON HCL 4 MG/2ML IJ SOLN: 4.0000 mg | Freq: Once | INTRAMUSCULAR | Status: DC

## 2019-12-16 MED ORDER — ONDANSETRON HCL 4 MG PO TABS: 4.0000 mg | ORAL_TABLET | Freq: Four times a day (QID) | ORAL | 0 refills | Status: DC

## 2019-12-16 MED ORDER — CLONIDINE HCL 0.1 MG PO TABS
0.1000 mg | ORAL_TABLET | Freq: Every day | ORAL | Status: DC
  Administered 2019-12-16: 0.1 mg via ORAL

## 2019-12-16 MED ORDER — LOPERAMIDE HCL 2 MG PO CAPS: 2.0000 mg | ORAL_CAPSULE | Freq: Four times a day (QID) | ORAL | 0 refills | Status: DC | PRN

## 2019-12-16 MED ORDER — ONDANSETRON 4 MG PO TBDP
4.0000 mg | ORAL_TABLET | Freq: Once | ORAL | Status: AC
  Administered 2019-12-16: 4 mg via ORAL

## 2019-12-16 NOTE — Discharge Instructions (Addendum)
Please try to drink small sips of Gatorade or Pedialyte on an hourly basis while awak

## 2019-12-16 NOTE — ED Provider Notes (Signed)
[facility]    CSN: [id number] Arrival date & time: 12/16/19  3428      History   Chief Complaint No chief complaint on file.   HPI Kristine Kelly is a 49 y.o. female.   Weeklong history of vomiting and diarrhea.  She denies abdominal pain.  No known exposures to similar illness.  Had Covid back in the spring.  She also denies any respiratory symptoms.  HPI  Past Medical History:  Diagnosis Date  . COVID-19   . GERD (gastroesophageal reflux disease)   . Hypertension    not currently on medications    Patient Active Problem List   Diagnosis Date Noted  . DUB (dysfunctional uterine bleeding) 04/28/2018  . Morbid obesity ([place]) 04/28/2018    Past Surgical History:  Procedure Laterality Date  . BREAST LUMPECTOMY    . DILATATION AND CURETTAGE/HYSTEROSCOPY WITH MINERVA N/A 07/30/2018   Procedure: DILATATION AND CURETTAGE/HYSTEROSCOPY WITH MINERVA;  Surgeon: Emily Filbert, MD;  Location: [facility];  Service: Gynecology;  Laterality: N/A;  . TUBAL LIGATION      OB History    Gravida  4   Para  3   Term  3   Preterm      AB  1   Living  3     SAB      TAB  1   Ectopic      Multiple      Live Births  3            Home Medications    Prior to Admission medications   Medication Sig Start Date End Date Taking? Authorizing Provider  albuterol (PROVENTIL HFA;VENTOLIN HFA) 108 (90 Base) MCG/ACT inhaler Inhale 2 puffs into the lungs every 4 (four) hours as needed for wheezing or shortness of breath. 06/22/15   Janne Napoleon, NP  benzonatate (TESSALON) 100 MG capsule Take 1 capsule (100 mg total) by mouth every 8 (eight) hours. Patient taking differently: Take 100 mg by mouth daily as needed.  05/10/19   Tasia Catchings, Amy V, PA-C  dicyclomine (BENTYL) 20 MG tablet Take 1 tablet (20 mg total) by mouth 2 (two) times daily. Patient not taking: Reported on 05/14/2019 05/10/19   Ok Edwards, PA-C  fluticasone [facility]) 50 MCG/ACT nasal spray Place 2  sprays into both nostrils daily. Patient not taking: Reported on 05/14/2019 05/10/19   Ok Edwards, PA-C  hydrOXYzine (ATARAX/VISTARIL) 25 MG tablet Take 1 tablet (25 mg total) by mouth every 6 (six) hours as needed for anxiety. 05/14/19   Domenic Moras, PA-C  Multiple Vitamin (MULTIVITAMIN WITH MINERALS) TABS tablet Take 1 tablet by mouth daily.    [provider]  omeprazole (PRILOSEC) 20 MG capsule Take 1 capsule (20 mg total) by mouth 2 (two) times daily before a meal. 05/14/19   Domenic Moras, PA-C  ondansetron (ZOFRAN ODT) 4 MG disintegrating tablet Take 1 tablet (4 mg total) by mouth every 8 (eight) hours as needed for nausea or vomiting. Patient not taking: Reported on 05/14/2019 05/10/19   Ok Edwards, PA-C  zinc gluconate 50 MG tablet Take 50 mg by mouth daily.    [provider]    Family History Family History  Problem Relation Age of Onset  . Asthma Mother     Social History Social History   Tobacco Use  . Smoking status: Never Smoker  . Smokeless tobacco: Never Used  Vaping Use  . Vaping Use: Never used  Substance  Use Topics  . Alcohol use: No  . Drug use: No     Allergies   Aspirin and Atenolol   Review of Systems Review of Systems  Constitutional: Positive for fatigue.  HENT: Negative.   Respiratory: Negative.   Cardiovascular: Negative.   Gastrointestinal: Positive for diarrhea, nausea and vomiting. Negative for abdominal pain and blood in stool.  Neurological: Negative.   Psychiatric/Behavioral: The patient is nervous/anxious.   All other systems reviewed and are negative.    Physical Exam Triage Vital Signs ED Triage Vitals [12/16/19 0925]  Enc Vitals Group     BP (!) 162/112     Pulse Rate (!) 130     Resp 20     Temp 98 F (36.7 C)     Temp Source Oral     SpO2 97 %     Weight      Height      Head Circumference      Peak Flow      Pain Score      Pain Loc      Pain Edu?      Excl. in [place]?    No data found.  Updated Vital  Signs BP (!) 162/112 (BP Location: Left Arm)   Pulse (!) 130   Temp 98 F (36.7 C) (Oral)   Resp 20   SpO2 97%   Visual Acuity Right Eye Distance:   Left Eye Distance:   Bilateral Distance:    Right Eye Near:   Left Eye Near:    Bilateral Near:     Physical Exam Vitals and nursing note reviewed.  Constitutional:      Appearance: Normal appearance. She is obese.  HENT:     Head: Normocephalic.     Nose: Nose normal.     Mouth/Throat:     Mouth: Mucous membranes are dry.  Cardiovascular:     Rate and Rhythm: Regular rhythm. Tachycardia present.  Pulmonary:     Effort: Pulmonary effort is normal.     Breath sounds: Normal breath sounds.  Abdominal:     General: Bowel sounds are normal.     Palpations: Abdomen is soft.     Tenderness: There is no abdominal tenderness. There is no guarding.  Musculoskeletal:     Cervical back: Normal range of motion.  Neurological:     General: No focal deficit present.     Mental Status: She is alert and oriented to person, place, and time.      UC Treatments / Results  Labs (all labs ordered are listed, but only abnormal results are displayed) Labs Reviewed - No data to display  EKG   Radiology No results found.  Procedures Procedures (including critical care time)  Medications Ordered in UC Medications - No data to display  Initial Impression / Assessment and Plan / UC Course  I have reviewed the triage vital signs and the nursing notes.  Pertinent labs & imaging results that were available during my care of the patient were reviewed by me and considered in my medical decision making (see chart for details).     Gastroenteritis.  Will treat with supportive care with antiemetic and antidiarrhea.  Encourage fluids.  Also check basic kidney functions and electrolytes Final Clinical Impressions(s) / UC Diagnoses   Final diagnoses:  None   Discharge Instructions   None    ED Prescriptions    None     PDMP not  reviewed this encounter.   Sabra Heck,  Lillette Boxer, MD 12/16/19 [phone]

## 2019-12-16 NOTE — ED Triage Notes (Signed)
Pt c/o n/v/d x1wk, now having weakness. [facility] they just did covid testing. States no vomiting today just nausea.

## 2019-12-17 LAB — BASIC METABOLIC PANEL
BUN/Creatinine Ratio: 8 — ABNORMAL LOW (ref 9–23)
BUN: 8 mg/dL (ref 6–24)
CO2: 20 mmol/L (ref 20–29)
Calcium: 9.7 mg/dL (ref 8.7–10.2)
Chloride: 103 mmol/L (ref 96–106)
Creatinine, Ser: 1.01 mg/dL — ABNORMAL HIGH (ref 0.57–1.00)
GFR calc Af Amer: 76 mL/min/{1.73_m2} (ref >59)
GFR calc non Af Amer: 66 mL/min/{1.73_m2} (ref >59)
Glucose: 134 mg/dL — ABNORMAL HIGH (ref 65–99)
Potassium: 4.1 mmol/L (ref 3.5–5.2)
Sodium: 138 mmol/L (ref 134–144)

## 2019-12-18 ENCOUNTER — Emergency Department (HOSPITAL_COMMUNITY): Payer: Commercial Managed Care - PPO

## 2019-12-18 ENCOUNTER — Encounter (HOSPITAL_COMMUNITY): Payer: Self-pay | Admitting: Emergency Medicine

## 2019-12-18 ENCOUNTER — Other Ambulatory Visit: Payer: Self-pay

## 2019-12-18 ENCOUNTER — Emergency Department (HOSPITAL_COMMUNITY)
Admission: EM | Admit: 2019-12-18 | Discharge: 2019-12-18 | Disposition: A | Discharge status: Home / Self Care | Payer: Commercial Managed Care - PPO | Attending: Emergency Medicine | Admitting: Emergency Medicine

## 2019-12-18 DIAGNOSIS — R000 Tachycardia, unspecified: Secondary | ICD-10-CM | POA: Diagnosis not present

## 2019-12-18 DIAGNOSIS — R079 Chest pain, unspecified: Secondary | ICD-10-CM | POA: Diagnosis present

## 2019-12-18 DIAGNOSIS — I712 Thoracic aortic aneurysm, without rupture, unspecified: Secondary | ICD-10-CM

## 2019-12-18 DIAGNOSIS — R002 Palpitations: Secondary | ICD-10-CM | POA: Diagnosis not present

## 2019-12-18 DIAGNOSIS — I10 Essential (primary) hypertension: Secondary | ICD-10-CM | POA: Insufficient documentation

## 2019-12-18 DIAGNOSIS — Z7952 Long term (current) use of systemic steroids: Secondary | ICD-10-CM | POA: Diagnosis not present

## 2019-12-18 DIAGNOSIS — Z8616 Personal history of COVID-19: Secondary | ICD-10-CM | POA: Diagnosis not present

## 2019-12-18 LAB — COMPREHENSIVE METABOLIC PANEL
ALT: 30 U/L (ref 0–44)
AST: 30 U/L (ref 15–41)
Albumin: 4.1 g/dL (ref 3.5–5.0)
Alkaline Phosphatase: 65 U/L (ref 38–126)
Anion gap: 12 (ref 5–15)
BUN: 10 mg/dL (ref 6–20)
CO2: 22 mmol/L (ref 22–32)
Calcium: 9.3 mg/dL (ref 8.9–10.3)
Chloride: 102 mmol/L (ref 98–111)
Creatinine, Ser: 1.15 mg/dL — ABNORMAL HIGH (ref 0.44–1.00)
GFR, Estimated: 58 mL/min — ABNORMAL LOW (ref >60)
Glucose, Bld: 128 mg/dL — ABNORMAL HIGH (ref 70–99)
Potassium: 4.1 mmol/L (ref 3.5–5.1)
Sodium: 136 mmol/L (ref 135–145)
Total Bilirubin: 1.4 mg/dL — ABNORMAL HIGH (ref 0.3–1.2)
Total Protein: 8.5 g/dL — ABNORMAL HIGH (ref 6.5–8.1)

## 2019-12-18 LAB — TROPONIN I (HIGH SENSITIVITY)
Troponin I (High Sensitivity): 5 ng/L (ref <18)
Troponin I (High Sensitivity): 7 ng/L (ref <18)

## 2019-12-18 LAB — CBC
HCT: 44.3 % (ref 36.0–46.0)
Hemoglobin: 14.3 g/dL (ref 12.0–15.0)
MCH: 28.5 pg (ref 26.0–34.0)
MCHC: 32.3 g/dL (ref 30.0–36.0)
MCV: 88.4 fL (ref 80.0–100.0)
Platelets: 261 10*3/uL (ref 150–400)
RBC: 5.01 MIL/uL (ref 3.87–5.11)
RDW: 13.2 % (ref 11.5–15.5)
WBC: 7.9 10*3/uL (ref 4.0–10.5)
nRBC: 0 % (ref 0.0–0.2)

## 2019-12-18 LAB — I-STAT BETA HCG BLOOD, ED (MC, WL, AP ONLY): I-stat hCG, quantitative: <5 m[IU]/mL (ref <5)

## 2019-12-18 LAB — LIPASE, BLOOD: Lipase: 30 U/L (ref 11–51)

## 2019-12-18 LAB — D-DIMER, QUANTITATIVE: D-Dimer, Quant: 1.07 ug/mL-FEU — ABNORMAL HIGH (ref 0.00–0.50)

## 2019-12-18 MED ORDER — SODIUM CHLORIDE 0.9 % IV BOLUS
1000.0000 mL | Freq: Once | INTRAVENOUS | Status: AC
  Administered 2019-12-18: 1000 mL via INTRAVENOUS

## 2019-12-18 MED ORDER — HYDROXYZINE HCL 25 MG PO TABS
25.0000 mg | ORAL_TABLET | Freq: Once | ORAL | Status: AC
  Administered 2019-12-18: 25 mg via ORAL
  Filled 2019-12-18: qty 1

## 2019-12-18 MED ORDER — IOHEXOL 350 MG/ML SOLN
80.0000 mL | Freq: Once | INTRAVENOUS | Status: AC | PRN
  Administered 2019-12-18: 80 mL via INTRAVENOUS

## 2019-12-18 MED ORDER — ACETAMINOPHEN ER 650 MG PO TBCR: 650.0000 mg | EXTENDED_RELEASE_TABLET | Freq: Three times a day (TID) | ORAL | 0 refills | Status: DC | PRN

## 2019-12-18 NOTE — ED Triage Notes (Signed)
Pt reports feeling poorly since last Thursday, has been to urgent care and had a negative COVID test. Reports "feels like my heart is racing and I can't walk to the other room w/o getting out of breath."

## 2019-12-18 NOTE — ED Notes (Signed)
ED Provider at bedside. 

## 2019-12-18 NOTE — ED Provider Notes (Signed)
[facility] EMERGENCY DEPARTMENT Provider Note   CSN: [id number] Arrival date & time: 12/18/19  2376     History Chief Complaint  Patient presents with  . Tachycardia  . Shortness of Breath    Kristine Kelly is a 49 y.o. female with a past medical history significant for GERD and hypertension not currently on any medications who presents to the ED due to central, nonradiating, sharp chest pain associated with palpitations that started 4 days ago.  Patient states she has been feeling unwell for over a week with nausea, vomiting, and diarrhea.  Patient was seen at urgent care on 11/24 and diagnosed with gastroenteritis and was given Imodium and Zofran which patient states improved her symptoms; however, patient notes she has been experiencing chest pain and shortness of breath over the past few days.  Patient notes symptoms are worse with ambulation and associated with diaphoresis.  She admits to decreased p.o. intake.  Denies history of blood clots, recent surgeries, recent long immobilizations, history of cancer, trauma, and hormonal treatments.  Denies lower extremity edema.  Patient also admits to being slightly anxious because she has not been feeling well.  Denies tobacco, alcohol, and drug use.  No family history of early CAD.  Denies abdominal pain, back pain, urinary vaginal symptoms.  Denies fever and chills. Denies melena, hematochezia, and hematemesis.  Patient was tested for Covid 2 days ago which she reports was negative.  History obtained from patient and past medical records. No interpreter used during encounter.   HPI: A 49 year old patient with a history of hypertension and obesity presents for evaluation of chest pain. Initial onset of pain was less than one hour ago. [name] chest pain is sharp and is worse with exertion. The patient reports some diaphoresis. [name] chest pain is middle- or left-sided, is not well-localized, is not described as  heaviness/pressure/tightness and does not radiate to the arms/jaw/neck. The patient does not complain of nausea. The patient has no history of stroke, has no history of peripheral artery disease, has not smoked in the past 90 days, denies any history of treated diabetes, has no relevant family history of coronary artery disease (first degree relative at less than age 3) and has no history of hypercholesterolemia.   Past Medical History:  Diagnosis Date  . COVID-19   . GERD (gastroesophageal reflux disease)   . Hypertension    not currently on medications    Patient Active Problem List   Diagnosis Date Noted  . DUB (dysfunctional uterine bleeding) 04/28/2018  . Morbid obesity ([place]) 04/28/2018    Past Surgical History:  Procedure Laterality Date  . BREAST LUMPECTOMY    . DILATATION AND CURETTAGE/HYSTEROSCOPY WITH MINERVA N/A 07/30/2018   Procedure: DILATATION AND CURETTAGE/HYSTEROSCOPY WITH MINERVA;  Surgeon: Emily Filbert, MD;  Location: [place];  Service: Gynecology;  Laterality: N/A;  . TUBAL LIGATION       OB History    Gravida  4   Para  3   Term  3   Preterm      AB  1   Living  3     SAB      TAB  1   Ectopic      Multiple      Live Births  3           Family History  Problem Relation Age of Onset  . Asthma Mother     Social History   Tobacco  Use  . Smoking status: Never Smoker  . Smokeless tobacco: Never Used  Vaping Use  . Vaping Use: Never used  Substance Use Topics  . Alcohol use: No  . Drug use: No    Home Medications Prior to Admission medications   Medication Sig Start Date End Date Taking? Authorizing Provider  albuterol (PROVENTIL HFA;VENTOLIN HFA) 108 (90 Base) MCG/ACT inhaler Inhale 2 puffs into the lungs every 4 (four) hours as needed for wheezing or shortness of breath. 06/22/15  Yes Mabe, Shanon Brow, NP  hydrOXYzine (ATARAX/VISTARIL) 25 MG tablet Take 1 tablet (25 mg total) by mouth every 6 (six) hours as needed  for anxiety. 05/14/19  Yes Domenic Moras, PA-C  ondansetron (ZOFRAN) 4 MG tablet Take 1 tablet (4 mg total) by mouth every 6 (six) hours. 12/16/19  Yes Wardell Honour, MD  acetaminophen (TYLENOL 8 HOUR) 650 MG CR tablet Take 1 tablet (650 mg total) by mouth every 8 (eight) hours as needed for pain. 12/18/19   Suzy Bouchard, PA-C  loperamide (IMODIUM) 2 MG capsule Take 1 capsule (2 mg total) by mouth 4 (four) times daily as needed for diarrhea or loose stools. Patient not taking: Reported on 12/18/2019 12/16/19   Wardell Honour, MD    Allergies    Aspirin and Atenolol  Review of Systems   Review of Systems  Constitutional: Positive for activity change, appetite change, diaphoresis and fatigue. Negative for chills and fever.  Respiratory: Positive for shortness of breath. Negative for cough.   Cardiovascular: Positive for chest pain and palpitations. Negative for leg swelling.  Gastrointestinal: Positive for diarrhea, nausea and vomiting. Negative for abdominal pain.  Genitourinary: Negative for dysuria.  Neurological: Negative for headaches.  All other systems reviewed and are negative.   Physical Exam Updated Vital Signs BP (!) 144/93   Pulse 85   Temp 98 F (36.7 C) (Oral)   Resp 17   Ht 5\' 7"  (1.702 m)   Wt 125.6 kg   LMP 12/09/2019   SpO2 100%   BMI 43.38 kg/m   Physical Exam Vitals and nursing note reviewed.  Constitutional:      General: She is not in acute distress.    Appearance: She is not toxic-appearing.  HENT:     Head: Normocephalic.     Comments: Dry mucous membranes. Eyes:     Pupils: Pupils are equal, round, and reactive to light.  Cardiovascular:     Rate and Rhythm: Regular rhythm. Tachycardia present.     Pulses: Normal pulses.     Heart sounds: Normal heart sounds. No murmur heard.  No friction rub. No gallop.      Comments: HR 130s during evaluation Pulmonary:     Effort: Pulmonary effort is normal.     Breath sounds: Normal breath  sounds.  Chest:     Comments: Anterior chest wall tenderness Abdominal:     General: Abdomen is flat. Bowel sounds are normal. There is no distension.     Palpations: Abdomen is soft.     Tenderness: There is no abdominal tenderness. There is no guarding or rebound.     Comments: Abdomen soft, nondistended, nontender to palpation in all quadrants without guarding or peritoneal signs. No rebound.   Musculoskeletal:     Cervical back: Neck supple.     Comments: No lower extremity edema.  Negative Homan sign bilaterally.  Skin:    General: Skin is warm and dry.  Neurological:     General: No focal  deficit present.  Psychiatric:        Mood and Affect: Mood is anxious.        Behavior: Behavior normal.     ED Results / Procedures / Treatments   Labs (all labs ordered are listed, but only abnormal results are displayed) Labs Reviewed  COMPREHENSIVE METABOLIC PANEL - Abnormal; Notable for the following components:      Result Value   Glucose, Bld 128 (*)    Creatinine, Ser 1.15 (*)    Total Protein 8.5 (*)    Total Bilirubin 1.4 (*)    GFR, Estimated 58 (*)    All other components within normal limits  D-DIMER, QUANTITATIVE (NOT AT [facility]) - Abnormal; Notable for the following components:   D-Dimer, Quant 1.07 (*)    All other components within normal limits  CBC  LIPASE, BLOOD  I-STAT BETA HCG BLOOD, ED ([facility], [facility], AP ONLY)  TROPONIN I (HIGH SENSITIVITY)  TROPONIN I (HIGH SENSITIVITY)    EKG None  Radiology DG Chest 2 View  Result Date: 12/18/2019 CLINICAL DATA:  Chest pain.  Shortness of breath. EXAM: CHEST - 2 VIEW COMPARISON:  May 14, 2019. FINDINGS: Similar cardiomediastinal silhouette. See prior CTA chest for characterization of ascending thoracic aortic aneurysm and follow-up recommendations. Both lungs are clear. No visible pleural effusions or pneumothorax. The visualized skeletal structures are unremarkable. IMPRESSION: No active cardiopulmonary disease.  Electronically Signed   By: Margaretha Sheffield MD   On: 12/18/2019 07:22    Procedures Procedures (including critical care time)  Medications Ordered in ED Medications  sodium chloride 0.9 % bolus 1,000 mL (0 mLs Intravenous Stopped 12/18/19 0900)  hydrOXYzine (ATARAX/VISTARIL) tablet 25 mg (25 mg Oral Given 12/18/19 0754)  sodium chloride 0.9 % bolus 1,000 mL (0 mLs Intravenous Stopped 12/18/19 1206)  iohexol (OMNIPAQUE) 350 MG/ML injection 80 mL (80 mLs Intravenous Contrast Given 12/18/19 1036)    ED Course  I have reviewed the triage vital signs and the nursing notes.  Pertinent labs & imaging results that were available during my care of the patient were reviewed by me and considered in my medical decision making (see chart for details).  Clinical Course as of Dec 18 1219  Fri Dec 18, 2019  3474 Pulse Rate(!): 130 [CA]  0653 Resp(!): 21 [CA]  0844 D-Dimer, Quant(!): 1.07 [CA]  0844 Creatinine(!): 1.15 [CA]  0844 Glucose(!): 128 [CA]  1021 Troponin I (High Sensitivity): 7 [CA]    Clinical Course User Index [CA] Suzy Bouchard, PA-C   MDM Rules/Calculators/A&P HEAR Score: 63                       49 year old female presents to the ED due to chest pain and shortness of breath x4 days. Patient was recently diagnosed with gastroenteritis 2 days ago and has been feeling generally unwell for over a week.  Patient denies history of blood clots, recent surgeries, recent long immobilizations, hormonal treatments, and lower extremity edema.  Upon arrival, patient is afebrile, tachycardic at 130, tachypneic at 21.  O2 saturation 100% on room air.  Physical exam significant for anterior chest wall tenderness which patient notes is different pain than her chest pain.  Dry mucous membranes.  No lower extremity edema or bilateral calf tenderness.  No clinical signs of DVT on exam.  Abdomen soft, nondistended, nontender.  Low suspicion for acute abdomen.  Will obtain routine labs to check for  electrolyte abnormalities and renal function given weeklong  history of nausea, vomiting, diarrhea.  Will obtain troponin, EKG, and chest x-ray to rule out cardiac etiology.  Also add d-dimer to rule out PE given tachycardia and shortness of breath. Suspect symptoms could be related to dehydration given dry mucous membranes and history of diarrhea/vomiting.  Will give 1 L IV fluids here in the ED. Feel anxiety could be contributing to tachycardia as well. Vistaril given.   EKG personally reviewed which demonstrates sinus tachycardia with no signs of acute ischemia.  Chest x-ray personally reviewed which is negative for sign of pneumonia, PTX, or widened mediastinum.  D-dimer elevated at 1.07.  Will obtain CTA of chest to rule out PE given patient's tachycardia and subjective shortness of breath.  CMP significant for hyperglycemia at 128 with no anion gap.  Doubt DKA.  Mild elevation creatinine at 1.15 likely due to dehydration.  Lipase normal at 30.  Doubt pancreatitis.  Initial troponin normal at 5.  Will obtain delta troponin to rule out ACS.  8:54 AM reassessed patient at bedside who is resting comfortably in bed.  Patient admits to mild symptomatic relief after trial of Vistaril.  Reviewed cardiac monitor in room and it appears her heart rate has normalized to the 80s.  Delta troponin flat.  Doubt ACS.  CTA personally reviewed which is negative for any signs of a PE.  Incidental finding of a 4.2 cm ascending thoracic aortic aneurysm.  Discussed results with patient.  Suspect symptoms related to dehydration and anxiety.  Dissection was considered, but thought to be less likely given presentation. Patient given 2 L of IV fluids here in the ED in which her heart rate normalized.  Advised patient to continue drinking fluids at home.  Instructed patient follow-up with PCP in regards to her thoracic aortic aneurysm and tachycardia. Strict ED precautions discussed with patient. Patient states understanding and  agrees to plan. Patient discharged home in no acute distress and stable vitals. Final Clinical Impression(s) / ED Diagnoses Final diagnoses:  Tachycardia  Nonspecific chest pain  Thoracic aortic aneurysm without rupture ([place])    Rx / DC Orders ED Discharge Orders         Ordered    acetaminophen (TYLENOL 8 HOUR) 650 MG CR tablet  Every 8 hours PRN        12/18/19 1159           Karie Kirks 12/18/19 1221    Pattricia Boss, MD 12/21/19 1331

## 2019-12-18 NOTE — Discharge Instructions (Addendum)
All of your labs were reassuring today. Your CT scan was negative for any blood clots. It did show a small aortic aneurysm. Please follow-up with PCP in regards to aneurysm. I have included the number for [facility] if you do not have a PCP. I suspect your symptoms were related to dehydration and anxiety. You were given IV fluids here in the ER. Continue drinking fluids. I am sending you home with tylenol as needed for pain. Follow-up with your PCP if symptoms do not improve within the next week. Return to the ER for new or worsening symptoms.

## 2020-01-13 NOTE — Progress Notes (Signed)
Patient ID: Kristine Kelly, female   DOB: [birth year], 49 y.o.   MRN: [id number]      Virtual Visit via Telephone Note  I connected with [place] on 01/14/20 at  1:50 PM EST by telephone and verified that I am speaking with the correct person using two identifiers.  Location: Patient: Kristine Kelly Provider: Freeman Caldron, PA-C   I discussed the limitations, risks, security and privacy concerns of performing an evaluation and management service by telephone and the availability of in person appointments. I also discussed with the patient that there may be a patient responsible charge related to this service. The patient expressed understanding and agreed to proceed.  PATIENT visit by telephone virtually in the context of Covid-19 pandemic. Patient location:  home My Location:  [facility] office Persons on the call: screened by Francisco Capuchin, me and the patient   History of Present Illness: After ED visits 11/24 and 12/18/2019 for gastroenteritis/dehydration then tachycardia respectively. All of this resolved and she is feeling well.  Appetite is good although she is trying to make healthier choices.  She does not have any complaints today   Observations/Objective: NAD. A&Ox3  Assessment and Plan: 1. Hyperglycemia I have had a lengthy discussion and provided education about insulin resistance and the intake of too much sugar/refined carbohydrates.  I have advised the patient to work at a goal of eliminating sugary drinks, candy, desserts, sweets, refined sugars, processed foods, and white carbohydrates.  The patient expresses understanding.  - Hemoglobin A1c; Future - Comprehensive metabolic panel; Future  2. Encounter for examination following treatment at hospital - Comprehensive metabolic panel; Future  3. Dehydration - Comprehensive metabolic panel; Future   Follow Up Instructions: Assign PCP in 6-8 weeks   I discussed the assessment and treatment plan with the patient. The patient  was provided an opportunity to ask questions and all were answered. The patient agreed with the plan and demonstrated an understanding of the instructions.   The patient was advised to call back or seek an in-person evaluation if the symptoms worsen or if the condition fails to improve as anticipated.  I provided 18 minutes of non-face-to-face time during this encounter.   Freeman Caldron, PA-C

## 2020-01-14 ENCOUNTER — Encounter: Payer: Self-pay | Admitting: Physician Assistant

## 2020-01-14 ENCOUNTER — Ambulatory Visit: Payer: Commercial Managed Care - PPO | Attending: Physician Assistant | Admitting: Physician Assistant

## 2020-01-14 ENCOUNTER — Other Ambulatory Visit: Payer: Self-pay

## 2020-01-14 DIAGNOSIS — R739 Hyperglycemia, unspecified: Secondary | ICD-10-CM

## 2020-01-14 DIAGNOSIS — Z09 Encounter for follow-up examination after completed treatment for conditions other than malignant neoplasm: Secondary | ICD-10-CM

## 2020-01-14 DIAGNOSIS — E860 Dehydration: Secondary | ICD-10-CM | POA: Diagnosis not present

## 2020-03-23 ENCOUNTER — Ambulatory Visit
Admission: EM | Admit: 2020-03-23 | Discharge: 2020-03-23 | Disposition: A | Discharge status: Home / Self Care | Payer: Commercial Managed Care - PPO | Attending: Family Medicine | Admitting: Family Medicine

## 2020-03-23 ENCOUNTER — Other Ambulatory Visit: Payer: Self-pay

## 2020-03-23 DIAGNOSIS — R42 Dizziness and giddiness: Secondary | ICD-10-CM

## 2020-03-23 DIAGNOSIS — R110 Nausea: Secondary | ICD-10-CM

## 2020-03-23 DIAGNOSIS — I712 Thoracic aortic aneurysm, without rupture, unspecified: Secondary | ICD-10-CM

## 2020-03-23 DIAGNOSIS — R011 Cardiac murmur, unspecified: Secondary | ICD-10-CM

## 2020-03-23 DIAGNOSIS — R030 Elevated blood-pressure reading, without diagnosis of hypertension: Secondary | ICD-10-CM | POA: Diagnosis not present

## 2020-03-23 DIAGNOSIS — E860 Dehydration: Secondary | ICD-10-CM

## 2020-03-23 LAB — POCT URINALYSIS DIP (MANUAL ENTRY)
Bilirubin, UA: NEGATIVE
Glucose, UA: NEGATIVE mg/dL
Ketones, POC UA: NEGATIVE mg/dL
Leukocytes, UA: NEGATIVE
Nitrite, UA: NEGATIVE
Protein Ur, POC: 30 mg/dL — AB
Spec Grav, UA: >=1.03 — AB (ref 1.010–1.025)
Urobilinogen, UA: 0.2 E.U./dL
pH, UA: 6.5 (ref 5.0–8.0)

## 2020-03-23 LAB — POCT FASTING CBG KUC MANUAL ENTRY: POCT Glucose (KUC): 117 mg/dL — AB (ref 70–99)

## 2020-03-23 MED ORDER — ONDANSETRON HCL 4 MG PO TABS: 4.0000 mg | ORAL_TABLET | Freq: Four times a day (QID) | ORAL | 0 refills | Status: DC

## 2020-03-23 MED ORDER — AMLODIPINE BESYLATE 5 MG PO TABS: 5.0000 mg | ORAL_TABLET | Freq: Every day | ORAL | 1 refills | Status: DC

## 2020-03-23 NOTE — ED Triage Notes (Signed)
Pt states yesterday at work she became dizzy and nausea. States her cbg was 72. States drank juice and ate a piece of chocolate and felt better. States today feels weak, dizzy and nauseated. No hx of DM.

## 2020-03-26 ENCOUNTER — Encounter (HOSPITAL_COMMUNITY): Payer: Self-pay | Admitting: *Deleted

## 2020-03-26 ENCOUNTER — Telehealth: Payer: Commercial Managed Care - PPO | Admitting: Emergency Medicine

## 2020-03-26 ENCOUNTER — Other Ambulatory Visit: Payer: Self-pay

## 2020-03-26 ENCOUNTER — Emergency Department (HOSPITAL_COMMUNITY)
Admission: EM | Admit: 2020-03-26 | Discharge: 2020-03-27 | Disposition: A | Discharge status: Home / Self Care | Payer: Commercial Managed Care - PPO | Attending: Emergency Medicine | Admitting: Emergency Medicine

## 2020-03-26 ENCOUNTER — Ambulatory Visit (HOSPITAL_COMMUNITY)
Admission: EM | Admit: 2020-03-26 | Discharge: 2020-03-26 | Disposition: A | Discharge status: Home / Self Care | Payer: Commercial Managed Care - PPO

## 2020-03-26 ENCOUNTER — Encounter (HOSPITAL_COMMUNITY): Payer: Self-pay | Admitting: Emergency Medicine

## 2020-03-26 DIAGNOSIS — Z9010 Acquired absence of unspecified breast and nipple: Secondary | ICD-10-CM | POA: Diagnosis not present

## 2020-03-26 DIAGNOSIS — R5383 Other fatigue: Secondary | ICD-10-CM | POA: Insufficient documentation

## 2020-03-26 DIAGNOSIS — R140 Abdominal distension (gaseous): Secondary | ICD-10-CM | POA: Insufficient documentation

## 2020-03-26 DIAGNOSIS — R1031 Right lower quadrant pain: Secondary | ICD-10-CM | POA: Diagnosis not present

## 2020-03-26 DIAGNOSIS — R110 Nausea: Secondary | ICD-10-CM | POA: Insufficient documentation

## 2020-03-26 DIAGNOSIS — R197 Diarrhea, unspecified: Secondary | ICD-10-CM | POA: Insufficient documentation

## 2020-03-26 DIAGNOSIS — Z8616 Personal history of COVID-19: Secondary | ICD-10-CM | POA: Diagnosis not present

## 2020-03-26 DIAGNOSIS — Z79899 Other long term (current) drug therapy: Secondary | ICD-10-CM | POA: Insufficient documentation

## 2020-03-26 DIAGNOSIS — K219 Gastro-esophageal reflux disease without esophagitis: Secondary | ICD-10-CM | POA: Insufficient documentation

## 2020-03-26 DIAGNOSIS — R109 Unspecified abdominal pain: Secondary | ICD-10-CM | POA: Diagnosis not present

## 2020-03-26 DIAGNOSIS — I10 Essential (primary) hypertension: Secondary | ICD-10-CM | POA: Insufficient documentation

## 2020-03-26 DIAGNOSIS — R1010 Upper abdominal pain, unspecified: Secondary | ICD-10-CM

## 2020-03-26 LAB — CBC
HCT: 44.8 % (ref 36.0–46.0)
Hemoglobin: 15.3 g/dL — ABNORMAL HIGH (ref 12.0–15.0)
MCH: 29.3 pg (ref 26.0–34.0)
MCHC: 34.2 g/dL (ref 30.0–36.0)
MCV: 85.8 fL (ref 80.0–100.0)
Platelets: 253 10*3/uL (ref 150–400)
RBC: 5.22 MIL/uL — ABNORMAL HIGH (ref 3.87–5.11)
RDW: 12.7 % (ref 11.5–15.5)
WBC: 9.6 10*3/uL (ref 4.0–10.5)
nRBC: 0 % (ref 0.0–0.2)

## 2020-03-26 LAB — COMPREHENSIVE METABOLIC PANEL
ALT: 19 U/L (ref 0–44)
AST: 24 U/L (ref 15–41)
Albumin: 4 g/dL (ref 3.5–5.0)
Alkaline Phosphatase: 64 U/L (ref 38–126)
Anion gap: 10 (ref 5–15)
BUN: 10 mg/dL (ref 6–20)
CO2: 20 mmol/L — ABNORMAL LOW (ref 22–32)
Calcium: 9.6 mg/dL (ref 8.9–10.3)
Chloride: 105 mmol/L (ref 98–111)
Creatinine, Ser: 0.98 mg/dL (ref 0.44–1.00)
GFR, Estimated: >60 mL/min (ref >60)
Glucose, Bld: 134 mg/dL — ABNORMAL HIGH (ref 70–99)
Potassium: 3.7 mmol/L (ref 3.5–5.1)
Sodium: 135 mmol/L (ref 135–145)
Total Bilirubin: 0.9 mg/dL (ref 0.3–1.2)
Total Protein: 8.4 g/dL — ABNORMAL HIGH (ref 6.5–8.1)

## 2020-03-26 LAB — URINALYSIS, ROUTINE W REFLEX MICROSCOPIC
Bilirubin Urine: NEGATIVE
Glucose, UA: NEGATIVE mg/dL
Hgb urine dipstick: NEGATIVE
Ketones, ur: 5 mg/dL — AB
Leukocytes,Ua: NEGATIVE
Nitrite: NEGATIVE
Protein, ur: NEGATIVE mg/dL
Specific Gravity, Urine: 1.023 (ref 1.005–1.030)
pH: 5 (ref 5.0–8.0)

## 2020-03-26 LAB — I-STAT BETA HCG BLOOD, ED (MC, WL, AP ONLY): I-stat hCG, quantitative: <5 m[IU]/mL (ref <5)

## 2020-03-26 LAB — LIPASE, BLOOD: Lipase: 30 U/L (ref 11–51)

## 2020-03-26 NOTE — ED Provider Notes (Signed)
[facility]    CSN: [id number] Arrival date & time: 03/26/20  1757      History   Chief Complaint Chief Complaint  Patient presents with  . Abdominal Pain  . Flank Pain    RT    HPI Kristine Kelly is a 50 y.o. female.   HPI   Abdominal Pain: Pt reports that on Wednesday she ate shrimp and fish at a restaurant and instantly did not feel well. She had almost immediate diarrhea and abdominal pains. Since then she has had abdominal pain that goes from her RUQ to her umbilical area down to her RLQ. She also has had nausea and fatigue along with belching and abdominal bloating. She states that overall she feels bad 8/10 with 10/10 being "I feel ike im dying and need to go to the hospital". She has taken tums for symptoms without any improvement.   Past Medical History:  Diagnosis Date  . COVID-19   . GERD (gastroesophageal reflux disease)   . Hypertension    not currently on medications    Patient Active Problem List   Diagnosis Date Noted  . DUB (dysfunctional uterine bleeding) 04/28/2018  . Morbid obesity ([place]) 04/28/2018    Past Surgical History:  Procedure Laterality Date  . BREAST LUMPECTOMY    . DILATATION AND CURETTAGE/HYSTEROSCOPY WITH MINERVA N/A 07/30/2018   Procedure: DILATATION AND CURETTAGE/HYSTEROSCOPY WITH MINERVA;  Surgeon: Emily Filbert, MD;  Location: [place];  Service: Gynecology;  Laterality: N/A;  . TUBAL LIGATION      OB History    Gravida  4   Para  3   Term  3   Preterm      AB  1   Living  3     SAB      IAB  1   Ectopic      Multiple      Live Births  3            Home Medications    Prior to Admission medications   Medication Sig Start Date End Date Taking? Authorizing Provider  amLODipine (NORVASC) 5 MG tablet Take 1 tablet (5 mg total) by mouth daily. 03/23/20   Scot Jun, FNP  ondansetron (ZOFRAN) 4 MG tablet Take 1 tablet (4 mg total) by mouth every 6 (six) hours. 03/23/20    Scot Jun, FNP    Family History Family History  Problem Relation Age of Onset  . Asthma Mother     Social History Social History   Tobacco Use  . Smoking status: Never Smoker  . Smokeless tobacco: Never Used  Vaping Use  . Vaping Use: Never used  Substance Use Topics  . Alcohol use: No  . Drug use: No     Allergies   Aspirin and Atenolol   Review of Systems Review of Systems  As stated above in HPI Physical Exam Triage Vital Signs ED Triage Vitals  Enc Vitals Group     BP 03/26/20 1827 (!) 143/95     Pulse Rate 03/26/20 1827 (!) 113     Resp 03/26/20 1827 20     Temp 03/26/20 1827 (!) 97.3 F (36.3 C)     Temp Source 03/26/20 1827 Tympanic     SpO2 03/26/20 1827 99 %     Weight --      Height --      Head Circumference --      Peak Flow --  Pain Score 03/26/20 1830 8     Pain Loc --      Pain Edu? --      Excl. in [place]? --    No data found.  Updated Vital Signs BP (!) 143/95 (BP Location: Right Arm)   Pulse (!) 113   Temp (!) 97.3 F (36.3 C) (Tympanic)   Resp 20   LMP 03/16/2020   SpO2 99%   Physical Exam Vitals and nursing note reviewed.  Constitutional:      General: She is in acute distress.     Appearance: She is well-developed. She is obese. She is ill-appearing and toxic-appearing. She is not diaphoretic.     Comments: Pt resting with her hand on her abdomen  HENT:     Mouth/Throat:     Mouth: Mucous membranes are moist.  Cardiovascular:     Rate and Rhythm: Regular rhythm. Tachycardia present.     Heart sounds: Normal heart sounds.  Pulmonary:     Effort: Pulmonary effort is normal.     Breath sounds: Normal breath sounds.  Abdominal:     General: Abdomen is flat. There is no distension.     Palpations: Abdomen is soft. There is no shifting dullness, fluid wave, hepatomegaly, splenomegaly or pulsatile mass.     Tenderness: There is abdominal tenderness in the right upper quadrant, right lower quadrant, epigastric area  and periumbilical area.     Hernia: No hernia is present.     Comments: Rushing abdominal sounds  Skin:    General: Skin is warm.  Neurological:     Mental Status: She is alert.      UC Treatments / Results  Labs (all labs ordered are listed, but only abnormal results are displayed) Labs Reviewed - No data to display  EKG   Radiology No results found.  Procedures Procedures (including critical care time)  Medications Ordered in UC Medications - No data to display  Initial Impression / Assessment and Plan / UC Course  I have reviewed the triage vital signs and the nursing notes.  Pertinent labs & imaging results that were available during my care of the patient were reviewed by me and considered in my medical decision making (see chart for details).     New. Concerning exam. Reviewed her labs from her visit on Wednesday in which she had a UA non-concerning for UTI. Given history, physical examination findings will refer her to the emergency room for further work up.   Final Clinical Impressions(s) / UC Diagnoses   Final diagnoses:  None   Discharge Instructions   None    ED Prescriptions    None     PDMP not reviewed this encounter.   Hughie Closs, [place] 03/26/20 1905

## 2020-03-26 NOTE — ED Triage Notes (Signed)
Pt c/o R sided abd pain radiating to R flank x 4 days, denies v/d, +nausea. Pt seen at [facility] sent to ED for further workup.

## 2020-03-26 NOTE — ED Triage Notes (Signed)
Pt reports on Wed after eating out at a restaurant she had Rt flank pain and Rt Abd. Pain . Pt also had diarrhea soon after eating.

## 2020-03-26 NOTE — Progress Notes (Signed)
Hi Kristine Kelly,  Based on what you shared with me, I feel your condition warrants further evaluation and I recommend that you be seen for a face to face office visit.   NOTE: If you entered your credit card information for this eVisit, you will not be charged. You may see a "hold" on your card for the $35 but that hold will drop off and you will not have a charge processed.   If you are having a true medical emergency please call 911.      For an urgent face to face visit, [place] has five urgent care centers for your convenience:     [facility] [place] Get Driving Directions [phone] [address] [place], [place] [address] . 10 am - 6pm Monday - Friday    [facility] Get Driving Directions [phone] [address] [place], [place] [address] . 10 am to 8 pm Monday-Friday . 12 pm to 8 pm [facility] Get Driving Directions [phone] [address], [place], [place] [address] . 8 am to 8 pm Monday-Friday . 9 am to 6 pm Saturday . 11 am to 6 pm Sunday     [facility] Get Driving Directions  [phone] [address], [place] [address] . 8 am to 8 pm Monday-Friday . 8 am to 4 pm [facility] [place] Get Driving Directions [phone] [address] [place], [place] [address] . 12 pm to 6 pm Monday-Friday      Your e-visit answers were reviewed by a board certified advanced clinical practitioner to complete your personal care plan.  Thank you for using e-Visits.   Approximately 5 minutes was spent documenting and reviewing patient's chart.

## 2020-03-27 ENCOUNTER — Emergency Department (HOSPITAL_COMMUNITY): Payer: Commercial Managed Care - PPO

## 2020-03-27 MED ORDER — SODIUM CHLORIDE 0.9 % IV BOLUS
1500.0000 mL | Freq: Once | INTRAVENOUS | Status: AC
  Administered 2020-03-27: 1500 mL via INTRAVENOUS

## 2020-03-27 MED ORDER — LORAZEPAM 2 MG/ML IJ SOLN
0.5000 mg | Freq: Once | INTRAMUSCULAR | Status: AC
  Administered 2020-03-27: 0.5 mg via INTRAVENOUS
  Filled 2020-03-27: qty 1

## 2020-03-27 MED ORDER — ONDANSETRON 4 MG PO TBDP: 4.0000 mg | ORAL_TABLET | Freq: Three times a day (TID) | ORAL | 0 refills | Status: DC | PRN

## 2020-03-27 MED ORDER — DICYCLOMINE HCL 10 MG PO CAPS
20.0000 mg | ORAL_CAPSULE | Freq: Once | ORAL | Status: AC
  Administered 2020-03-27: 20 mg via ORAL
  Filled 2020-03-27: qty 2

## 2020-03-27 MED ORDER — DICYCLOMINE HCL 20 MG PO TABS: 20.0000 mg | ORAL_TABLET | Freq: Two times a day (BID) | ORAL | 0 refills | Status: DC | PRN

## 2020-03-27 MED ORDER — METOCLOPRAMIDE HCL 5 MG/ML IJ SOLN
10.0000 mg | INTRAMUSCULAR | Status: AC
  Administered 2020-03-27: 10 mg via INTRAVENOUS
  Filled 2020-03-27: qty 2

## 2020-03-27 NOTE — ED Notes (Signed)
Pt ambulatory with steady gait to bathroom

## 2020-03-27 NOTE — Discharge Instructions (Signed)
Your evaluation in the emergency department has been reassuring. We recommend that you remain well-hydrated by drinking plenty of fluids. Use Zofran for management of nausea and Bentyl for management of persistent abdominal pain/cramping. You may take Tylenol or ibuprofen as well if pain persists. Follow-up with your primary care doctor. Return for new or concerning symptoms.

## 2020-04-12 NOTE — Progress Notes (Deleted)
Cardiology Office Note:    Date:  04/12/2020   ID:  Kristine Kelly, DOB 1970-03-03, MRN [id number]  PCP:  Patient, No Pcp Per   [facility]  Cardiologist:  No primary care provider on file.  Advanced Practice Provider:  No care team member to display Electrophysiologist:  None  :[id number]}   Referring MD: Scot Jun, FNP    History of Present Illness:    Kristine Kelly is a 50 y.o. female with a hx of HTN, GERD and COVID-19 infection who was referred by Molli Barrows FNP for further evaluation of hypertension.  Past Medical History:  Diagnosis Date  . COVID-19   . GERD (gastroesophageal reflux disease)   . Hypertension    not currently on medications    Past Surgical History:  Procedure Laterality Date  . BREAST LUMPECTOMY    . DILATATION AND CURETTAGE/HYSTEROSCOPY WITH MINERVA N/A 07/30/2018   Procedure: DILATATION AND CURETTAGE/HYSTEROSCOPY WITH MINERVA;  Surgeon: Emily Filbert, MD;  Location: [facility];  Service: Gynecology;  Laterality: N/A;  . TUBAL LIGATION      Current Medications: No outpatient medications have been marked as taking for the 04/14/20 encounter (Appointment) with Freada Bergeron, MD.     Allergies:   Aspirin and Atenolol   Social History   Socioeconomic History  . Marital status: Single    Spouse name: Not on file  . Number of children: Not on file  . Years of education: Not on file  . Highest education level: Not on file  Occupational History  . Not on file  Tobacco Use  . Smoking status: Never Smoker  . Smokeless tobacco: Never Used  Vaping Use  . Vaping Use: Never used  Substance and Sexual Activity  . Alcohol use: No  . Drug use: No  . Sexual activity: Yes    Birth control/protection: Surgical  Other Topics Concern  . Not on file  Social History Narrative  . Not on file   Social Determinants of Health   Financial Resource Strain: Not on file  Food Insecurity: Not on file   Transportation Needs: Not on file  Physical Activity: Not on file  Stress: Not on file  Social Connections: Not on file     Family History: [name] ***family history includes Asthma in her mother.  ROS:   Please see the history of present illness.    *** All other systems reviewed and are negative.  EKGs/Labs/Other Studies Reviewed:    The following studies were reviewed today: 12/18/2019: FINDINGS: Cardiovascular: No signs of significant vascular disease. Limited assessment of the aorta which shows 4.2 cm maximal caliber heart size is normal without pericardial effusion. No signs of central, lobar or segmental pulmonary embolism, study mildly limited by bolus timing. Subsegmental evaluation is limited.  Mediastinum/Nodes: No adenopathy in the chest. Esophagus grossly normal by CT.  Lungs/Pleura: Minimal basilar atelectasis. No effusion. No consolidation. Airways are patent.  Upper Abdomen: Imaged portions the liver, spleen, pancreas and adrenal glands without acute process. No acute gastrointestinal finding.  Musculoskeletal: Spinal degenerative changes. No acute or destructive bone finding.  Review of the MIP images confirms the above findings.  IMPRESSION: No signs of central, lobar or segmental pulmonary embolism, study mildly limited by bolus timing. Subsegmental evaluation is limited.  4.2 cm ascending thoracic aortic aneurysm. Recommend annual imaging followup by CTA or MRA. This recommendation follows 2010 ACCF/AHA/AATS/ACR/ASA/SCA/SCAI/SIR/STS/SVM Guidelines for the Diagnosis and Management of Patients with Thoracic  Aortic Disease. Circulation. 2010; 121: C166-A630. Aortic aneurysm NOS (ICD10-I71.9)  EKG:  EKG is *** ordered today.  The ekg ordered today demonstrates ***  Recent Labs: 03/26/2020: ALT 19; BUN 10; Creatinine, Ser 0.98; Hemoglobin 15.3; Platelets 253; Potassium 3.7; Sodium 135  Recent Lipid Panel    Component Value Date/Time    CHOL 158 10/13/2008 2046   TRIG 127 10/13/2008 2046   HDL 38 (L) 10/13/2008 2046   CHOLHDL 4.2 Ratio 10/13/2008 2046   VLDL 25 10/13/2008 2046   [place] 95 10/13/2008 2046     Risk Assessment/Calculations:   {Does this patient have ATRIAL FIBRILLATION?:[phone]}   Physical Exam:    VS:  LMP 03/16/2020     Wt Readings from Last 3 Encounters:  12/18/19 277 lb (125.6 kg)  08/07/18 263 lb 3.2 oz (119.4 kg)  07/30/18 263 lb 3.7 oz (119.4 kg)     GEN: *** Well nourished, well developed in no acute distress HEENT: Normal NECK: No JVD; No carotid bruits LYMPHATICS: No lymphadenopathy CARDIAC: ***RRR, no murmurs, rubs, gallops RESPIRATORY:  Clear to auscultation without rales, wheezing or rhonchi  ABDOMEN: Soft, non-tender, non-distended MUSCULOSKELETAL:  No edema; No deformity  SKIN: Warm and dry NEUROLOGIC:  Alert and oriented x 3 PSYCHIATRIC:  Normal affect   ASSESSMENT:    No diagnosis found. PLAN:    In order of problems listed above:  #HTN:  #HLD:  #Obesity:   {Are you ordering a CV Procedure (e.g. stress test, cath, DCCV, TEE, etc)?   Press F2        :[id number]}    Medication Adjustments/Labs and Tests Ordered: Current medicines are reviewed at length with the patient today.  Concerns regarding medicines are outlined above.  No orders of the defined types were placed in this encounter.  No orders of the defined types were placed in this encounter.   There are no Patient Instructions on file for this visit.   Signed, Freada Bergeron, MD  04/12/2020 7:40 PM    [place]

## 2020-04-13 NOTE — ED Provider Notes (Signed)
[facility] EMERGENCY DEPARTMENT Provider Note   CSN: [id number] Arrival date & time: 03/26/20  1914     History Chief Complaint  Patient presents with  . Abdominal Pain    Kristine Kelly is a 50 y.o. female.  50 year old female with history of hypertension, esophageal reflux presents to the emergency department for evaluation of abdominal pain.  Patient complaining of pain to her right abdomen and right flank.  Symptoms present for 4 days and began after eating shrimp at a restaurant.  Had diarrhea shortly after.  She also has had nausea and fatigue along with belching and abdominal bloating.  Denies vomiting, melena, hematochezia, urinary symptoms, fevers.  Abdominal surgical history significant for tubal ligation.  The history is provided by the patient. No language interpreter was used.  Abdominal Pain      Past Medical History:  Diagnosis Date  . COVID-19   . GERD (gastroesophageal reflux disease)   . Hypertension    not currently on medications    Patient Active Problem List   Diagnosis Date Noted  . DUB (dysfunctional uterine bleeding) 04/28/2018  . Morbid obesity ([place]) 04/28/2018    Past Surgical History:  Procedure Laterality Date  . BREAST LUMPECTOMY    . DILATATION AND CURETTAGE/HYSTEROSCOPY WITH MINERVA N/A 07/30/2018   Procedure: DILATATION AND CURETTAGE/HYSTEROSCOPY WITH MINERVA;  Surgeon: Emily Filbert, MD;  Location: [facility];  Service: Gynecology;  Laterality: N/A;  . TUBAL LIGATION       OB History    Gravida  4   Para  3   Term  3   Preterm      AB  1   Living  3     SAB      IAB  1   Ectopic      Multiple      Live Births  3           Family History  Problem Relation Age of Onset  . Asthma Mother     Social History   Tobacco Use  . Smoking status: Never Smoker  . Smokeless tobacco: Never Used  Vaping Use  . Vaping Use: Never used  Substance Use Topics  . Alcohol use: No  . Drug  use: No    Home Medications Prior to Admission medications   Medication Sig Start Date End Date Taking? Authorizing Provider  dicyclomine (BENTYL) 20 MG tablet Take 1 tablet (20 mg total) by mouth every 12 (twelve) hours as needed (for abdominal pain/cramping). 03/27/20  Yes Antonietta Breach, PA-C  ondansetron (ZOFRAN ODT) 4 MG disintegrating tablet Take 1 tablet (4 mg total) by mouth every 8 (eight) hours as needed for nausea or vomiting. 03/27/20  Yes Antonietta Breach, PA-C  amLODipine (NORVASC) 5 MG tablet Take 1 tablet (5 mg total) by mouth daily. 03/23/20   Scot Jun, FNP  ondansetron (ZOFRAN) 4 MG tablet Take 1 tablet (4 mg total) by mouth every 6 (six) hours. 03/23/20   Scot Jun, FNP    Allergies    Aspirin and Atenolol  Review of Systems   Review of Systems  Gastrointestinal: Positive for abdominal pain.  Ten systems reviewed and are negative for acute change, except as noted in the HPI.    Physical Exam Updated Vital Signs BP 104/67 (BP Location: Right Arm)   Pulse 70   Temp 97.7 F (36.5 C) (Oral)   Resp 18   LMP 03/16/2020   SpO2 100%  Physical Exam Vitals and nursing note reviewed.  Constitutional:      General: She is not in acute distress.    Appearance: She is well-developed. She is not diaphoretic.     Comments: Patient appears uncomfortable, but nontoxic.  HENT:     Head: Normocephalic and atraumatic.  Eyes:     General: No scleral icterus.    Conjunctiva/sclera: Conjunctivae normal.  Pulmonary:     Effort: Pulmonary effort is normal. No respiratory distress.     Breath sounds: No stridor. No wheezing.     Comments: Respirations even and unlabored Abdominal:     Comments: Soft, obese abdomen with TTP in the right abdomen. No involuntary guarding, peritoneal signs. No palpable masses.  Musculoskeletal:        General: Normal range of motion.     Cervical back: Normal range of motion.  Skin:    General: Skin is warm and dry.     Coloration: Skin  is not pale.     Findings: No erythema or rash.  Neurological:     Mental Status: She is alert and oriented to person, place, and time.     Coordination: Coordination normal.  Psychiatric:        Behavior: Behavior normal.     ED Results / Procedures / Treatments   Labs (all labs ordered are listed, but only abnormal results are displayed) Labs Reviewed  COMPREHENSIVE METABOLIC PANEL - Abnormal; Notable for the following components:      Result Value   CO2 20 (*)    Glucose, Bld 134 (*)    Total Protein 8.4 (*)    All other components within normal limits  CBC - Abnormal; Notable for the following components:   RBC 5.22 (*)    Hemoglobin 15.3 (*)    All other components within normal limits  URINALYSIS, ROUTINE W REFLEX MICROSCOPIC - Abnormal; Notable for the following components:   APPearance HAZY (*)    Ketones, ur 5 (*)    All other components within normal limits  LIPASE, BLOOD  I-STAT BETA HCG BLOOD, ED ([facility], [facility], [facility] ONLY)    EKG EKG Interpretation  Date/Time:  Saturday March 26 2020 20:15:53 EST Ventricular Rate:  127 PR Interval:  138 QRS Duration: 66 QT Interval:  294 QTC Calculation: 427 R Axis:   19 Text Interpretation: Sinus tachycardia Nonspecific ST and T wave abnormality Abnormal ECG Confirmed by Carmin Muskrat [phone]) on 03/27/2020 9:04:23 PM   Radiology US Renal  Result Date: 03/27/2020 CLINICAL DATA:  Initial evaluation for acute right-sided flank pain. History of prior cholecystectomy. EXAM: RENAL / URINARY TRACT ULTRASOUND COMPLETE COMPARISON:  Prior ultrasound from 07/20/2003. FINDINGS: Right Kidney: Renal measurements: 10.2 x 4.1 x 4.5 cm = volume: 98.1 mL. Renal echogenicity within normal limits. No nephrolithiasis or hydronephrosis. No focal renal mass. Left Kidney: Renal measurements: 9.1 x 4.5 x 4.1 cm = volume: 87.3 mL. Renal echogenicity within normal limits. 8 mm nonobstructive calculus present at the lower pole. No hydronephrosis. 1.8 x 1.5 x  1.9 cm simple cyst present at the lower pole as well. Bladder: Grossly within normal limits allowing for degree of bladder distension. Other: None. IMPRESSION: 1. 8 mm nonobstructive left renal calculus. No hydronephrosis. 2. 1.9 cm simple left renal cyst. 3. Otherwise unremarkable and normal renal ultrasound. No right-sided findings to explain patient's right-sided flank pain identified. Electronically Signed   By: Jeannine Boga M.D.   On: 03/27/2020 01:41     Procedures Procedures  Medications Ordered in ED Medications  metoCLOPramide (REGLAN) injection 10 mg (10 mg Intravenous Given 03/27/20 0030)  dicyclomine (BENTYL) capsule 20 mg (20 mg Oral Given 03/27/20 0030)  sodium chloride 0.9 % bolus 1,500 mL (0 mLs Intravenous Stopped 03/27/20 0603)  LORazepam (ATIVAN) injection 0.5 mg (0.5 mg Intravenous Given 03/27/20 0048)    ED Course  I have reviewed the triage vital signs and the nursing notes.  Pertinent labs & imaging results that were available during my care of the patient were reviewed by me and considered in my medical decision making (see chart for details).  Clinical Course as of 04/13/20 0165  Nancy Fetter Mar 27, 2020  0043 Received call from ultrasound tech as patient was noted to be very agitated, unable to lay on the stretcher.  Was up and pacing in the hallway, but redirectable.  Confirmed with RN that patient did receive a dose of Reglan.  This is likely causing an adverse reaction and the patient's agitation.  Will give IV Ativan.  Ultrasound tech instructed to redirect the patient back to the ED and we will call to have her reimaged when these symptoms are better controlled. [KH]  0211 Intrarenal stone noted on the LEFT but no hydronephrosis on either side to suggest obstructive uropathy.  [VV]  7482 Patient states that she is feeling better. Unfortunately, her IV fluids have been slow to infuse. She is okay waiting in the emergency department until she is hydrated further. RN  informed to put IV fluids on a pump. [KH]    Clinical Course User Index [KH] Beverely Pace   MDM Rules/Calculators/A&P                          50 year old female presents to the emergency department for evaluation of abdominal pain and diarrhea as well as nausea.  Symptoms began after eating shrimp at a restaurant.  She has received IV fluids in the emergency department as well as Bentyl and Reglan.  On repeat assessment, symptoms have improved.  Pain has lessened.  She did undergo renal ultrasound given her right flank pain.  There is no evidence of obstructive uropathy.  Urinalysis negative for UTI.  No hematuria.  Likely residual food borne illness.  Encouraged hydration and outpatient PCP follow up.  Will provide Rx for Bentyl and Zofran.  Return precautions discussed and provided. Patient discharged in stable condition with no unaddressed concerns.   Final Clinical Impression(s) / ED Diagnoses Final diagnoses:  Abdominal pain, unspecified abdominal location  Nausea    Rx / DC Orders ED Discharge Orders         Ordered    ondansetron (ZOFRAN ODT) 4 MG disintegrating tablet  Every 8 hours PRN        03/27/20 0602    dicyclomine (BENTYL) 20 MG tablet  Every 12 hours PRN        03/27/20 0602           Antonietta Breach, PA-C 04/13/20 7078    Little, Wenda Overland, MD 04/13/20 1415

## 2020-04-14 ENCOUNTER — Ambulatory Visit: Payer: Commercial Managed Care - PPO | Admitting: Cardiology

## 2020-04-21 ENCOUNTER — Telehealth: Payer: Commercial Managed Care - PPO | Admitting: Internal Medicine

## 2020-04-26 ENCOUNTER — Telehealth (INDEPENDENT_AMBULATORY_CARE_PROVIDER_SITE_OTHER): Payer: Commercial Managed Care - PPO | Admitting: Family

## 2020-04-26 ENCOUNTER — Encounter: Payer: Self-pay | Admitting: Family

## 2020-04-26 DIAGNOSIS — Z7689 Persons encountering health services in other specified circumstances: Secondary | ICD-10-CM | POA: Diagnosis not present

## 2020-04-26 NOTE — Progress Notes (Signed)
Virtual Visit via Telephone Note  I connected with Kristine Kelly, on 04/26/2020 at 4:07 PM by telephone due to the COVID-19 pandemic and verified that I am speaking with the correct person using two identifiers.  Due to current restrictions/limitations of in-office visits due to the COVID-19 pandemic, this scheduled clinical appointment was converted to a telehealth visit.   Consent: I discussed the limitations, risks, security and privacy concerns of performing an evaluation and management service by telephone and the availability of in person appointments. I also discussed with the patient that there may be a patient responsible charge related to this service. The patient expressed understanding and agreed to proceed.  Location of Patient: Home  Location of Provider: [facility] [place] participating in Telemedicine visit: [place], NP Elmon Else, CMA  History of Present Illness: Kristine Kelly is a 50 year-old female who presents to establish care.   Current issues and/or concerns: Reports shortness of breath has improved some since having Covid in April 2021.   Past Medical History:  Diagnosis Date  . COVID-19   . GERD (gastroesophageal reflux disease)   . Hypertension    not currently on medications   Allergies  Allergen Reactions  . Aspirin Anaphylaxis  . Atenolol Anaphylaxis    Current Outpatient Medications on File Prior to Visit  Medication Sig Dispense Refill  . amLODipine (NORVASC) 5 MG tablet Take 1 tablet (5 mg total) by mouth daily. 30 tablet 1   No current facility-administered medications on file prior to visit.    Observations/Objective: Alert and oriented x 3. Not in acute distress. Physical examination not completed as this is a telemedicine visit.  Assessment and Plan: 1. Encounter to establish care: - Patient presents today to establish care.  - Return for annual physical examination,  labs, and health maintenance. Arrive fasting meaning having had no food and/or nothing to drink for at least 8 hours prior to appointment.  Please take scheduled medications as normal.  Follow Up Instructions: Return for annual physical exam.   Patient was given clear instructions to go to Emergency Department or return to medical center if symptoms don't improve, worsen, or new problems develop.The patient verbalized understanding.  I discussed the assessment and treatment plan with the patient. The patient was provided an opportunity to ask questions and all were answered. The patient agreed with the plan and demonstrated an understanding of the instructions.   The patient was advised to call back or seek an in-person evaluation if the symptoms worsen or if the condition fails to improve as anticipated.   I provided 10 minutes total of non-face-to-face time during this encounter including median intraservice time, reviewing previous notes, labs, imaging, medications, management and patient verbalized understanding.    Camillia Herter, NP  [facility] [place], [place] 04/26/2020, 4:07 PM

## 2020-04-26 NOTE — Progress Notes (Signed)
Establish care

## 2020-05-15 ENCOUNTER — Encounter: Payer: Self-pay | Admitting: Cardiovascular Disease

## 2020-05-15 NOTE — Progress Notes (Signed)
This encounter was created in error - please disregard.

## 2020-05-16 ENCOUNTER — Encounter: Payer: Commercial Managed Care - PPO | Admitting: Cardiovascular Disease

## 2020-05-25 ENCOUNTER — Other Ambulatory Visit: Payer: Self-pay

## 2020-05-25 ENCOUNTER — Encounter: Payer: Self-pay | Admitting: Family

## 2020-05-25 ENCOUNTER — Ambulatory Visit (INDEPENDENT_AMBULATORY_CARE_PROVIDER_SITE_OTHER): Payer: Commercial Managed Care - PPO | Admitting: Family

## 2020-05-25 VITALS — BP 130/91 | HR 96 | Temp 98.1°F | Resp 15 | Ht 66.61 in | Wt 254.0 lb

## 2020-05-25 DIAGNOSIS — Z1329 Encounter for screening for other suspected endocrine disorder: Secondary | ICD-10-CM

## 2020-05-25 DIAGNOSIS — Z13228 Encounter for screening for other metabolic disorders: Secondary | ICD-10-CM | POA: Diagnosis not present

## 2020-05-25 DIAGNOSIS — Z13220 Encounter for screening for lipoid disorders: Secondary | ICD-10-CM

## 2020-05-25 DIAGNOSIS — Z1231 Encounter for screening mammogram for malignant neoplasm of breast: Secondary | ICD-10-CM

## 2020-05-25 DIAGNOSIS — Z0000 Encounter for general adult medical examination without abnormal findings: Secondary | ICD-10-CM

## 2020-05-25 DIAGNOSIS — B359 Dermatophytosis, unspecified: Secondary | ICD-10-CM | POA: Insufficient documentation

## 2020-05-25 DIAGNOSIS — N920 Excessive and frequent menstruation with regular cycle: Secondary | ICD-10-CM | POA: Insufficient documentation

## 2020-05-25 DIAGNOSIS — Z131 Encounter for screening for diabetes mellitus: Secondary | ICD-10-CM

## 2020-05-25 DIAGNOSIS — Z1211 Encounter for screening for malignant neoplasm of colon: Secondary | ICD-10-CM

## 2020-05-25 DIAGNOSIS — Z23 Encounter for immunization: Secondary | ICD-10-CM

## 2020-05-25 DIAGNOSIS — Z130 Encounter for screening for diseases of the blood and blood-forming organs and certain disorders involving the immune mechanism: Secondary | ICD-10-CM | POA: Diagnosis not present

## 2020-05-25 DIAGNOSIS — Z1159 Encounter for screening for other viral diseases: Secondary | ICD-10-CM

## 2020-05-25 DIAGNOSIS — Z124 Encounter for screening for malignant neoplasm of cervix: Secondary | ICD-10-CM

## 2020-05-25 DIAGNOSIS — R319 Hematuria, unspecified: Secondary | ICD-10-CM | POA: Insufficient documentation

## 2020-05-25 NOTE — Progress Notes (Signed)
Patient ID: Kristine Kelly, female    DOB: 1971/01/09  MRN: [id number]  CC: Annual Exam  Subjective: Kristine Kelly is a 50 y.o. female who presents for annual physical exam.   Patient Active Problem List   Diagnosis Date Noted  . Blood in urine 05/25/2020  . Dermatophytosis 05/25/2020  . Menorrhagia 05/25/2020  . Dysmenorrhea 04/28/2018  . Obesity 04/28/2018  . Anxiety 10/04/2016     Current Outpatient Medications on File Prior to Visit  Medication Sig Dispense Refill  . hydrOXYzine (ATARAX/VISTARIL) 25 MG tablet Take 25 mg by mouth every 4 (four) hours as needed for anxiety.    Marland Kitchen amLODipine (NORVASC) 5 MG tablet Take 1 tablet (5 mg total) by mouth daily. (Patient not taking: Reported on 05/25/2020) 30 tablet 1   No current facility-administered medications on file prior to visit.    Allergies  Allergen Reactions  . Aspirin Anaphylaxis  . Atenolol Anaphylaxis    Social History   Socioeconomic History  . Marital status: Single    Spouse name: Not on file  . Number of children: Not on file  . Years of education: Not on file  . Highest education level: Not on file  Occupational History  . Not on file  Tobacco Use  . Smoking status: Never Smoker  . Smokeless tobacco: Never Used  Vaping Use  . Vaping Use: Never used  Substance and Sexual Activity  . Alcohol use: No  . Drug use: No  . Sexual activity: Yes    Birth control/protection: Surgical  Other Topics Concern  . Not on file  Social History Narrative  . Not on file   Social Determinants of Health   Financial Resource Strain: Not on file  Food Insecurity: Not on file  Transportation Needs: Not on file  Physical Activity: Not on file  Stress: Not on file  Social Connections: Not on file  Intimate Partner Violence: Not on file    Family History  Problem Relation Age of Onset  . Asthma Mother     Past Surgical History:  Procedure Laterality Date  . BREAST LUMPECTOMY    . DILATATION AND  CURETTAGE/HYSTEROSCOPY WITH MINERVA N/A 07/30/2018   Procedure: DILATATION AND CURETTAGE/HYSTEROSCOPY WITH MINERVA;  Surgeon: Emily Filbert, MD;  Location: [facility];  Service: Gynecology;  Laterality: N/A;  . TUBAL LIGATION      ROS: Review of Systems Negative except as stated above  PHYSICAL EXAM: BP (!) 130/91 (BP Location: Left Arm, Patient Position: Sitting, Cuff Size: Normal)   Pulse 96   Temp 98.1 F (36.7 C)   Resp 15   Ht 5' 6.61" (1.692 m)   Wt 254 lb (115.2 kg)   SpO2 98%   BMI 40.24 kg/m    Wt Readings from Last 3 Encounters:  05/25/20 254 lb (115.2 kg)  12/18/19 277 lb (125.6 kg)  08/07/18 263 lb 3.2 oz (119.4 kg)   Physical Exam HENT:     Head: Normocephalic and atraumatic.     Right Ear: Tympanic membrane, ear canal and external ear normal.     Left Ear: Tympanic membrane, ear canal and external ear normal.     Nose: Nose normal.     Mouth/Throat:     Mouth: Mucous membranes are moist.     Pharynx: Oropharynx is clear.  Eyes:     Extraocular Movements: Extraocular movements intact.     Conjunctiva/sclera: Conjunctivae normal.     Pupils: Pupils are equal, round,  and reactive to light.  Cardiovascular:     Rate and Rhythm: Normal rate and regular rhythm.     Pulses: Normal pulses.     Heart sounds: Normal heart sounds.  Pulmonary:     Effort: Pulmonary effort is normal.     Breath sounds: Normal breath sounds.  Chest:     Comments: Patient declined examination. Abdominal:     General: Bowel sounds are normal.     Palpations: Abdomen is soft.  Genitourinary:    Comments: Patient declined examination. Musculoskeletal:        General: Normal range of motion.     Cervical back: Normal range of motion and neck supple.  Skin:    General: Skin is warm and dry.     Capillary Refill: Capillary refill takes less than 2 seconds.  Neurological:     General: No focal deficit present.     Mental Status: She is alert and oriented to person,  place, and time.  Psychiatric:        Mood and Affect: Mood normal.        Behavior: Behavior normal.     ASSESSMENT AND PLAN: 1. Annual physical exam: - Counseled on 150 minutes of exercise per week as tolerated, healthy eating (including decreased daily intake of saturated fats, cholesterol, added sugars, sodium), STI prevention, and routine healthcare maintenance.  2. Screening for metabolic disorder: - CMP last obtained 03/26/2020.  3. Screening for deficiency anemia: - CBC last obtained 03/26/2020.  4. Diabetes mellitus screening: - Hemoglobin A1c to screen for pre-diabetes/diabetes. - Hemoglobin A1c  5. Screening cholesterol level: - Lipid panel to screen for high cholesterol.  - Lipid panel  6. Thyroid disorder screen: - TSH to check thyroid function.  - TSH+T4F+T3Free  7. Need for hepatitis C screening test: - Hepatitis C antibody to screen for hepatitis C.  - Hepatitis C Antibody  8. Encounter for screening mammogram for malignant neoplasm of breast: - Referral for breast cancer screening by mammogram.  - MM Digital Screening; Future  9. Cervical cancer screening: - Referral to Gynecology for cervical cancer screening by PAP smear.  - Ambulatory referral to Gynecology  10. Colon cancer screening: - Referral to Gastroenterology for colon cancer screening by colonoscopy. - Ambulatory referral to Gastroenterology  11. Need for Tdap vaccination: - Tdap administered today in clinic. - Tdap vaccine greater than or equal to 7yo IM  Patient was given the opportunity to ask questions.  Patient verbalized understanding of the plan and was able to repeat key elements of the plan. Patient was given clear instructions to go to Emergency Department or return to medical center if symptoms don't improve, worsen, or new problems develop.The patient verbalized understanding.   Orders Placed This Encounter  Procedures  . MM Digital Screening  . Tdap vaccine greater than or  equal to 7yo IM  . Hepatitis C Antibody  . Lipid panel  . TSH+T4F+T3Free  . Hemoglobin A1c  . Ambulatory referral to Gastroenterology  . Ambulatory referral to Gynecology     Requested Prescriptions    No prescriptions requested or ordered in this encounter   Follow-up with primary provider as scheduled.   Camillia Herter, NP

## 2020-05-25 NOTE — Progress Notes (Signed)
Physical  Wants Tdap vaccine

## 2020-05-26 LAB — TSH+T4F+T3FREE
Free T4: 1.18 ng/dL (ref 0.82–1.77)
T3, Free: 3.2 pg/mL (ref 2.0–4.4)
TSH: 2 u[IU]/mL (ref 0.450–4.500)

## 2020-05-26 LAB — LIPID PANEL
Chol/HDL Ratio: 3.6 ratio (ref 0.0–4.4)
Cholesterol, Total: 197 mg/dL (ref 100–199)
HDL: 54 mg/dL (ref >39)
LDL Chol Calc (NIH): 126 mg/dL — ABNORMAL HIGH (ref 0–99)
Triglycerides: 93 mg/dL (ref 0–149)
VLDL Cholesterol Cal: 17 mg/dL (ref 5–40)

## 2020-05-26 LAB — HEMOGLOBIN A1C

## 2020-05-26 LAB — HEPATITIS C ANTIBODY: Hep C Virus Ab: <0.1 s/co ratio (ref 0.0–0.9)

## 2020-05-26 NOTE — Progress Notes (Signed)
Hepatitis C negative.  Thyroid function normal.   It seems the hemoglobin A1c will need to be recollected.   LDL cholesterol, sometimes called "bad cholesterol", higher than expected. High cholesterol may increase risk of heart attack and/or stroke. Consider eating more fruits, vegetables, and lean baked meats such as chicken or fish. Moderate intensity exercise at least 150 minutes as tolerated per week may help as well.   The following is for provider reference only: The 10-year ASCVD risk score is: 2.1%   Values used to calculate the score:     Age: 50 years     Sex: Female     Is Non-Hispanic African American: Yes     Diabetic: No     Tobacco smoker: No     Systolic Blood Pressure: 341 mmHg     Is BP treated: No     HDL Cholesterol: 54 mg/dL     Total Cholesterol: 197 mg/dL

## 2020-05-30 ENCOUNTER — Other Ambulatory Visit: Payer: Self-pay

## 2020-05-30 ENCOUNTER — Other Ambulatory Visit: Payer: Commercial Managed Care - PPO

## 2020-05-30 DIAGNOSIS — Z23 Encounter for immunization: Secondary | ICD-10-CM | POA: Diagnosis not present

## 2020-05-30 DIAGNOSIS — Z131 Encounter for screening for diabetes mellitus: Secondary | ICD-10-CM

## 2020-05-30 NOTE — Progress Notes (Signed)
A1c ordered.

## 2020-05-31 LAB — HEMOGLOBIN A1C
Est. average glucose Bld gHb Est-mCnc: 117 mg/dL
Hgb A1c MFr Bld: 5.7 % — ABNORMAL HIGH (ref 4.8–5.6)

## 2020-05-31 NOTE — Progress Notes (Signed)
Hemoglobin A1c is consistent with pre-diabetes. Practice healthy eating habits of fresh fruit and vegetables, lean baked meats such as chicken, fish, and [place]; limit breads, rice, pastas, and desserts; practice regular aerobic exercise (at least 150 minutes a week as tolerated). Patient encouraged to have rechecked in 6 months.

## 2020-07-06 ENCOUNTER — Encounter: Payer: Commercial Managed Care - PPO | Admitting: Family Medicine

## 2020-08-02 ENCOUNTER — Ambulatory Visit: Payer: Commercial Managed Care - PPO

## 2020-08-07 ENCOUNTER — Ambulatory Visit
Admission: EM | Admit: 2020-08-07 | Discharge: 2020-08-07 | Disposition: A | Discharge status: Home / Self Care | Payer: Commercial Managed Care - PPO | Attending: Emergency Medicine | Admitting: Emergency Medicine

## 2020-08-07 ENCOUNTER — Other Ambulatory Visit: Payer: Self-pay

## 2020-08-07 DIAGNOSIS — N760 Acute vaginitis: Secondary | ICD-10-CM | POA: Insufficient documentation

## 2020-08-07 DIAGNOSIS — B9689 Other specified bacterial agents as the cause of diseases classified elsewhere: Secondary | ICD-10-CM | POA: Diagnosis present

## 2020-08-07 DIAGNOSIS — N939 Abnormal uterine and vaginal bleeding, unspecified: Secondary | ICD-10-CM

## 2020-08-07 LAB — POCT URINALYSIS DIP (MANUAL ENTRY)
Bilirubin, UA: NEGATIVE
Glucose, UA: NEGATIVE mg/dL
Ketones, POC UA: NEGATIVE mg/dL
Leukocytes, UA: NEGATIVE
Nitrite, UA: NEGATIVE
Protein Ur, POC: 30 mg/dL — AB
Spec Grav, UA: 1.025 (ref 1.010–1.025)
Urobilinogen, UA: 1 E.U./dL
pH, UA: 6 (ref 5.0–8.0)

## 2020-08-07 MED ORDER — MEGESTROL ACETATE 20 MG PO TABS: 40.0000 mg | ORAL_TABLET | Freq: Two times a day (BID) | ORAL | 0 refills | Status: AC

## 2020-08-07 MED ORDER — NAPROXEN 500 MG PO TABS: 500.0000 mg | ORAL_TABLET | Freq: Two times a day (BID) | ORAL | 0 refills | Status: DC

## 2020-08-07 NOTE — Discharge Instructions (Addendum)
The Naprosyn and Megace will slow down the bleeding.  Follow-up with your OB/GYN as scheduled on Wednesday.  You can also start taking supplemental iron or multivitamin.

## 2020-08-07 NOTE — ED Triage Notes (Signed)
Five day h/o "heavy" vaginal bleeding that started with her MP.  Two days of abdominal pain at the onset. Pt c/o fatigue, nausea and decreased appetite. Has been taking Pamprin with pain relief.

## 2020-08-07 NOTE — ED Provider Notes (Signed)
HPI  SUBJECTIVE:  Kristine Kelly is a 50 y.o. female who presents with 6 days of vaginal bleeding, generalized weakness.  She states that she was going approximately through 1 pad per hour, but that the bleeding has slowed down.  She states that the blood now comes out with urination.  She is not sure if she has any hematuria.  She had pelvic pain 4 days ago, but this has resolved.  No chest pain, shortness of breath, dizziness, syncope, abdominal, back pain, urinary complaints vaginal odor, vulvar itching.  She is status post ablation for heavy vaginal bleeding.  She has tried Pamprin and Advil which is helped with the cramping.  No aggravating factors.  He is a long-term monogamous relationship with a female who is asymptomatic.  STDs are not a concern today however, would like to be checked.  She has a past medical history of heavy vaginal bleeding status post ablation, bilateral tubal ligation, remote history of BV and yeast, hypertension.  No history of anemia, transfusion, DVT, PE, smoking, fibroids, coagulopathy, STDs.  She denies possibility of being pregnant.  She has an OB/GYN with appointment this Wednesday.   Past Medical History:  Diagnosis Date   COVID-19    GERD (gastroesophageal reflux disease)    Hypertension    not currently on medications    Past Surgical History:  Procedure Laterality Date   BREAST LUMPECTOMY     DILATATION AND CURETTAGE/HYSTEROSCOPY WITH MINERVA N/A 07/30/2018   Procedure: DILATATION AND CURETTAGE/HYSTEROSCOPY WITH MINERVA;  Surgeon: Emily Filbert, MD;  Location: [facility];  Service: Gynecology;  Laterality: N/A;   TUBAL LIGATION      Family History  Problem Relation Age of Onset   Asthma Mother     Social History   Tobacco Use   Smoking status: Never   Smokeless tobacco: Never  Vaping Use   Vaping Use: Never used  Substance Use Topics   Alcohol use: No   Drug use: No    No current facility-administered medications for this  encounter.  Current Outpatient Medications:    megestrol (MEGACE) 20 MG tablet, Take 2 tablets (40 mg total) by mouth 2 (two) times daily for 21 days., Disp: 84 tablet, Rfl: 0   naproxen (NAPROSYN) 500 MG tablet, Take 1 tablet (500 mg total) by mouth 2 (two) times daily for 5 days., Disp: 10 tablet, Rfl: 0   amLODipine (NORVASC) 5 MG tablet, Take 1 tablet (5 mg total) by mouth daily. (Patient not taking: Reported on 05/25/2020), Disp: 30 tablet, Rfl: 1   hydrOXYzine (ATARAX/VISTARIL) 25 MG tablet, Take 25 mg by mouth every 4 (four) hours as needed for anxiety., Disp: , Rfl:   Allergies  Allergen Reactions   Aspirin Anaphylaxis   Atenolol Anaphylaxis     ROS  As noted in HPI.   Physical Exam  BP (!) 156/98 (BP Location: Left Arm)   Pulse (!) 105   Temp 97.7 F (36.5 C) (Oral)   Resp 18   LMP 08/02/2020 (Exact Date)   SpO2 97%   Constitutional: Well developed, well nourished, no acute distress Eyes:  EOMI, conjunctiva normal bilaterally.  Lids pink. HENT: Normocephalic, atraumatic,mucus membranes moist Respiratory: Normal inspiratory effort Cardiovascular: Mild regular tachycardia GI: nondistended, soft, nontender GU: External genitalia normal positive dark blood in vaginal vault no odor.  Normal os. skin: No rash, skin intact Musculoskeletal: no deformities Neurologic: Alert & oriented x 3, no focal neuro deficits Psychiatric: Speech and behavior appropriate  ED Course   Medications - No data to display  Orders Placed This Encounter  Procedures   POCT urinalysis dipstick    Standing Status:   Standing    Number of Occurrences:   1   Results for orders placed or performed during the hospital encounter of 08/07/20  CBC  Result Value Ref Range   WBC 6.8 3.4 - 10.8 x10E3/uL   RBC 4.72 3.77 - 5.28 x10E6/uL   Hemoglobin 13.9 11.1 - 15.9 g/dL   Hematocrit 41.5 34.0 - 46.6 %   MCV 88 79 - 97 fL   MCH 29.4 26.6 - 33.0 pg   MCHC 33.5 31.5 - 35.7 g/dL   RDW 13.1 11.7  - 15.4 %   Platelets 209 150 - 450 x10E3/uL  POCT urinalysis dipstick  Result Value Ref Range   Color, UA straw (A) yellow   Clarity, UA clear clear   Glucose, UA negative negative mg/dL   Bilirubin, UA negative negative   Ketones, POC UA negative negative mg/dL   Spec Grav, UA 1.025 1.010 - 1.025   Blood, UA large (A) negative   pH, UA 6.0 5.0 - 8.0   Protein Ur, POC =30 (A) negative mg/dL   Urobilinogen, UA 1.0 0.2 or 1.0 E.U./dL   Nitrite, UA Negative Negative   Leukocytes, UA Negative Negative  Cervicovaginal ancillary only  Result Value Ref Range   Neisseria Gonorrhea Negative    Chlamydia Negative    Trichomonas Negative    Bacterial Vaginitis (gardnerella) Positive (A)    Comment      Normal Reference Range Bacterial Vaginosis - Negative   Comment Normal Reference Range Trichomonas - Negative    Comment Normal Reference [place] Chlamydia - Negative    Comment      Normal Reference Range Neisseria Gonorrhea - Negative    No results found for this or any previous visit (from the past 24 hour(s)).  No results found.  ED Clinical Impression  1. Abnormal vaginal bleeding   2. Vaginal bleeding   3. BV (bacterial vaginosis)      ED Assessment/Plan  Will check CBC.  He has mild tachycardia, but otherwise appears well perfused.  She is okay with waiting for results.  Not sure if this is hematuria or vaginal bleeding, so we will do a pelvic and will also check for STDs and BV.    Hemoglobin stable at 13.6.  She will need to follow-up with OB/GYN about this today.  She has BV.  will E prescribe Flagyl to pharmacy on record.  We will have staff notify patient.  Positive hematuria, but suspect this is from the vaginal bleeding.  She does not have any evidence of UTI.  Will start on Megace, Naprosyn.  She has tolerated Megace before without any problem.  She has follow-up with OB/GYN on Wednesday.  ER return precautions given   MDM, treatment plan, and plan for follow-up with  patient. Discussed sn/sx that should prompt return to the ED. patient agrees with plan.   Meds ordered this encounter  Medications   naproxen (NAPROSYN) 500 MG tablet    Sig: Take 1 tablet (500 mg total) by mouth 2 (two) times daily for 5 days.    Dispense:  10 tablet    Refill:  0   megestrol (MEGACE) 20 MG tablet    Sig: Take 2 tablets (40 mg total) by mouth 2 (two) times daily for 21 days.    Dispense:  84 tablet  Refill:  0       *This clinic note was created using [employer]. Therefore, there may be occasional mistakes despite careful proofreading.  ?    Melynda Ripple, MD 08/09/20 [phone]

## 2020-08-08 LAB — CBC
Hematocrit: 41.5 % (ref 34.0–46.6)
Hemoglobin: 13.9 g/dL (ref 11.1–15.9)
MCH: 29.4 pg (ref 26.6–33.0)
MCHC: 33.5 g/dL (ref 31.5–35.7)
MCV: 88 fL (ref 79–97)
Platelets: 209 10*3/uL (ref 150–450)
RBC: 4.72 x10E6/uL (ref 3.77–5.28)
RDW: 13.1 % (ref 11.7–15.4)
WBC: 6.8 10*3/uL (ref 3.4–10.8)

## 2020-08-08 LAB — CERVICOVAGINAL ANCILLARY ONLY
Bacterial Vaginitis (gardnerella): POSITIVE — AB
Chlamydia: NEGATIVE
Comment: NEGATIVE
Comment: NEGATIVE
Comment: NEGATIVE
Comment: NORMAL
Neisseria Gonorrhea: NEGATIVE
Trichomonas: NEGATIVE

## 2020-08-09 ENCOUNTER — Telehealth: Payer: Self-pay | Admitting: Emergency Medicine

## 2020-08-09 DIAGNOSIS — B9689 Other specified bacterial agents as the cause of diseases classified elsewhere: Secondary | ICD-10-CM

## 2020-08-09 MED ORDER — METRONIDAZOLE 500 MG PO TABS: 500.0000 mg | ORAL_TABLET | Freq: Two times a day (BID) | ORAL | 0 refills | Status: AC

## 2020-08-09 NOTE — Telephone Encounter (Signed)
Testing positive for BV.  Will prescribed Flagyl to pharmacy on record.  will have staff notify patient.

## 2020-08-10 ENCOUNTER — Ambulatory Visit (INDEPENDENT_AMBULATORY_CARE_PROVIDER_SITE_OTHER): Payer: Commercial Managed Care - PPO | Admitting: Obstetrics and Gynecology

## 2020-08-10 ENCOUNTER — Encounter: Payer: Self-pay | Admitting: Obstetrics and Gynecology

## 2020-08-10 ENCOUNTER — Other Ambulatory Visit: Payer: Self-pay

## 2020-08-10 DIAGNOSIS — N938 Other specified abnormal uterine and vaginal bleeding: Secondary | ICD-10-CM | POA: Insufficient documentation

## 2020-08-10 MED ORDER — MEGESTROL ACETATE 40 MG PO TABS: 40.0000 mg | ORAL_TABLET | Freq: Every day | ORAL | 2 refills | Status: DC

## 2020-08-10 NOTE — Patient Instructions (Signed)
Vaginal Hysterectomy, Care After The following information offers guidance on how to care for yourself after your procedure. Your health care provider may also give you more specific instructions. If you have problems or questions, contact your health careprovider. What can I expect after the procedure? After the procedure, it is common to have: Pain in the lower abdomen and vagina. Vaginal bleeding and discharge for up to 1 week. You will need to use a sanitary pad after this procedure. Difficulty having a bowel movement (constipation). Temporary problems emptying the bladder. Tiredness (fatigue). Poor appetite. Less interest in sex. Feelings of sadness or other emotions. If your ovaries were also removed, it is also common to have symptoms of menopause, such as hot flashes, night sweats, and lack of sleep (insomnia). Follow these instructions at home: Medicines  Take over-the-counter and prescription medicines only as told by your health care provider. Do not take aspirin or NSAIDs, such as ibuprofen. These medicines can cause bleeding. Ask your health care provider if the medicine prescribed to you: Requires you to avoid driving or using machinery. Can cause constipation. You may need to take these actions to prevent or treat constipation: Drink enough fluid to keep your urine pale yellow. Take over-the-counter or prescription medicines. Eat foods that are high in fiber, such as beans, whole grains, and fresh fruits and vegetables. Limit foods that are high in fat and processed sugars, such as fried or sweet foods.  Activity  Rest as told by your health care provider. Return to your normal activities as told by your health care provider. Ask your health care provider what activities are safe for you Avoid sitting for a long time without moving. Get up to take short walks every 1-2 hours. This is important to improve blood flow and breathing. Ask for help if you feel weak or  unsteady. Try to have someone home with you for 1-2 weeks to help you with everyday chores. Do not lift anything that is heavier than 10 lb (4.5 kg), or the limit that you are told, until your health care provider says that it is safe. If you were given a sedative during the procedure, it can affect you for several hours. Do not drive or operate machinery until your health care provider says that it is safe.  Lifestyle Do not use any products that contain nicotine or tobacco. These products include cigarettes, chewing tobacco, and vaping devices, such as e-cigarettes. These can delay healing after surgery. If you need help quitting, ask your health care provider. Do not drink alcohol until your health care provider approves. General instructions Do not douche, use tampons, or have sex for at least 6 weeks, or as told by your health care provider. If you struggle with physical or emotional changes after your procedure, speak with your health care provider or a therapist. The stitches inside your vagina will dissolve over time and do not need to be taken out. Do not take baths, swim, or use a hot tub until your health care provider approves. You may only be allowed to take showers for 2-3 weeks Wear compression stockings as told by your health care provider. These stockings help to prevent blood clots and reduce swelling in your legs. Keep all follow-up visits. This is important. Contact a health care provider if: Your pain medicine is not helping. You have a fever. You have nausea or vomiting that does not go away. You feel dizzy. You have blood, pus, or a bad-smelling discharge from your  vagina more than 1 week after the procedure. You continue to have trouble urinating 3-5 days after the procedure. Get help right away if: You have severe pain in your abdomen or back. You faint. You have heavy vaginal bleeding and blood clots, soaking through a sanitary pad in less than 1 hour. You have  chest pain or shortness of breath. You have pain, swelling, or redness in your leg. These symptoms may represent a serious problem that is an emergency. Do not wait to see if the symptoms will go away. Get medical help right away. Call your local emergency services (911 in the [place] Do not drive yourself to the hospital. Summary After the procedure, it is common to have pain, vaginal bleeding, constipation, temporary problems emptying your bladder, and feelings of sadness or other emotions. Take over-the-counter and prescription medicines only as told by your health care provider. Rest as told by your health care provider. Return to your normal activities as told by your health care provider. Contact a health care provider if your pain medicine is not helping, or you have a fever, dizziness, or trouble urinating several days after the procedure. Get help right away if you have severe pain in your abdomen or back, or if you faint, have heavy bleeding, or have chest pain or shortness of breath. This information is not intended to replace advice given to you by your health care provider. Make sure you discuss any questions you have with your healthcare provider. Document Revised: 09/11/2019 Document Reviewed: 09/11/2019 Elsevier Patient Education  2022 [place]. Vaginal Hysterectomy  A vaginal hysterectomy is a procedure to remove all or part of the uterus through a small incision in the vagina. In this procedure, your health care provider may remove your entire uterus, including the cervix. The cervix is the opening and bottom part of the uterus and is located between the vagina and theuterus. Sometimes, the ovaries and fallopian tubes are also removed. This surgery may be done to treat problems such as: Noncancerous growths in the uterus (uterine fibroids) that cause symptoms. A condition that causes the lining of the uterus to grow in other areas (endometriosis). Problems with pelvic  support. Cancer of the cervix, ovaries, uterus, or tissue that lines the uterus (endometrium). Excessive bleeding in the uterus. When removing your uterus, your health care provider may also remove the ovaries and the fallopian tubes. After this procedure, you will no longer beable to have a baby, and you will no longer have a menstrual period. Tell a health care provider about: Any allergies you have. All medicines you are taking, including vitamins, herbs, eye drops, creams, and over-the-counter medicines. Any problems you or family members have had with anesthetic medicines. Any blood disorders you have. Any surgeries you have had. Any medical conditions you have. Whether you are pregnant or may be pregnant. What are the risks? Generally, this is a safe procedure. However, problems may occur, including: Bleeding. Infection. Blood clots in the legs or lungs. Damage to nearby structures or organs. Pain during sex. Allergic reactions to medicines. What happens before the procedure? Staying hydrated Follow instructions from your health care provider about hydration, which may include: Up to 2 hours before the procedure - you may continue to drink clear liquids, such as water, clear fruit juice, black coffee, and plain tea.  Eating and drinking restrictions Follow instructions from your health care provider about eating and drinking, which may include: 8 hours before the procedure - stop eating heavy meals  or foods, such as meat, fried foods, or fatty foods. 6 hours before the procedure - stop eating light meals or foods, such as toast or cereal. 6 hours before the procedure - stop drinking milk or drinks that contain milk. 2 hours before the procedure - stop drinking clear liquids. Medicines Ask your health care provider about: Changing or stopping your regular medicines. This is especially important if you are taking diabetes medicines or blood thinners. Taking medicines such as  aspirin and ibuprofen. These medicines can thin your blood. Do not take these medicines unless your health care provider tells you to take them. Taking over-the-counter medicines, vitamins, herbs, and supplements. You may be asked to take a medicine to empty your colon (bowel preparation). General instructions If you were asked to do a bowel preparation before the procedure, follow instructions from your health care provider. This procedure can affect the way you feel about yourself. Talk with your health care provider about the physical and emotional changes hysterectomy may cause. Do not use any products that contain nicotine or tobacco for at least 4 weeks before the procedure. These products include cigarettes, e-cigarettes, and chewing tobacco. If you need help quitting, ask your health care provider. Plan to have a responsible adult take you home from the hospital or clinic. Plan to have a responsible adult care for you for the time you are told after you leave the hospital or clinic. This is important. Surgery safety Ask your health care provider: How your surgery site will be marked. What steps will be taken to help prevent infection. These may include: Removing hair at the surgery site. Washing skin with a germ-killing soap. Receiving antibiotic medicine. What happens during the procedure? An IV will be inserted into one of your veins. You will be given one or more of the following: A medicine to help you relax (sedative). A medicine to numb the area (local anesthetic). A medicine to make you fall asleep (general anesthetic). A medicine that is injected into your spine to numb the area below and slightly above the injection site (spinal anesthetic). A medicine that is injected into an area of your body to numb everything below the injection site (regional anesthetic). Your surgeon will make an incision in your vagina. Your surgeon will locate and remove all or part of your uterus.  Part or all of the uterus will be removed through the vagina. Your ovaries and fallopian tubes may be removed at the same time. The incision in your vagina will be closed with stitches (sutures) that dissolve over time. The procedure may vary among health care providers and hospitals. What happens after the procedure? Your blood pressure, heart rate, breathing rate, and blood oxygen level will be monitored until you leave the hospital or clinic. You will be encouraged to walk as soon as possible. You will also use a device or do breathing exercises to keep your lungs clear. You may have to wear compression stockings. These stockings help to prevent blood clots and reduce swelling in your legs. You will be given pain medicine as needed. You will need to wear a sanitary pad for vaginal discharge or bleeding. Summary A vaginal hysterectomy is a procedure to remove all or part of the uterus through the vagina. You may need a vaginal hysterectomy to treat a variety of abnormalities of the uterus. Plan to have a responsible adult take you home from the hospital or clinic. Plan to have a responsible adult care for you for the  time you are told after you leave the hospital or clinic. This is important. This information is not intended to replace advice given to you by your health care provider. Make sure you discuss any questions you have with your healthcare provider. Document Revised: 09/11/2019 Document Reviewed: 09/11/2019 Elsevier Patient Education  [place].

## 2020-08-10 NOTE — Progress Notes (Signed)
Ms Canelo presents with C/O DUB for the last year. H/O endometrial ablation in 7/20 with Dr Hulan Fray. Pt reports still had some bleeding after procedure. However has noted increased monthly bleeding over the last several months. Seen in UC last week d/t 5-7 days of vaginal bleeding. Hgb was stable. Started on Megace and bleeding has improved. She also reports menopausal Sx.  H/O BTL H/O TSVD x 3  (largest 8# 3 oz) H/O EAB  PE AF VSS Lungs clear Heart RRR Abd soft + BS  A/P DUB  Will check GYN U/S. Continue with Megace bid x 21 days total and then decrease to qd. F/U after U/S to discuss results.treatment options and complete pap smear. Pt is interested in hysterectomy. Information provided.

## 2020-08-19 ENCOUNTER — Other Ambulatory Visit: Payer: Self-pay

## 2020-08-19 ENCOUNTER — Ambulatory Visit (HOSPITAL_COMMUNITY)
Admission: RE | Admit: 2020-08-19 | Discharge: 2020-08-19 | Disposition: A | Discharge status: Home / Self Care | Payer: Commercial Managed Care - PPO | Source: Ambulatory Visit | Attending: Obstetrics and Gynecology | Admitting: Obstetrics and Gynecology

## 2020-08-19 DIAGNOSIS — N938 Other specified abnormal uterine and vaginal bleeding: Secondary | ICD-10-CM | POA: Diagnosis present

## 2020-08-20 ENCOUNTER — Encounter: Payer: Self-pay | Admitting: Emergency Medicine

## 2020-08-20 ENCOUNTER — Ambulatory Visit
Admission: EM | Admit: 2020-08-20 | Discharge: 2020-08-20 | Disposition: A | Discharge status: Home / Self Care | Payer: Commercial Managed Care - PPO

## 2020-08-20 DIAGNOSIS — R5383 Other fatigue: Secondary | ICD-10-CM

## 2020-08-20 NOTE — Discharge Instructions (Addendum)
Encourage slowly introducing physical activity into your daily routine.  Recommend protein shakes at least twice a day and increase intake of fruits and vegetables.  Continue daily vitamin.  Continue to hydrate with water. Try melatonin which can help with relaxation and rest at bedtime.  Keep follow-up with OB/GYN to discuss different options for relief of vaginal bleeding and menopausal type symptoms.  Also follow-up with your primary care doctor.

## 2020-08-20 NOTE — ED Triage Notes (Addendum)
Seen recently for similar problems. C/o mood swings, sleep disturbances, hot flashes, and says she's been taking medicine to "slow the vaginal bleeding," denies hormonal tablets. Denies heavy vaginal bleeding at this time. Felt fatigued at work today, with bouts of high and low energy.

## 2020-08-20 NOTE — ED Provider Notes (Signed)
[facility]    CSN: [id number] Arrival date & time: 08/20/20  1044      History   Chief Complaint Chief Complaint  Patient presents with   Fatigue    HPI Kristine Kelly is a 50 y.o. female.   HPI Patient presents today with a concern for fatigue and hot flashes.  Patient reports Today upon arrival to work she developed a sensation of feeling overwhelmingly hot followed by intense fatigue and subsequently obtaining her energy back.  She reports having these sensations along with profound fatigue for several weeks.  She has been seen here for abnormal vaginal bleeding and subsequently seen by OB/GYN and had an ultrasound completed.  She reports that her OB/GYN has discussed the possibility of a hysterectomy to alleviate the vaginal bleeding. She is requesting medication to help resolve fatigue.  She has had a recent CBC which was normal and negative of any low hemoglobin she is also had a normal TSH level.  She endorses a poor appetite and she does not engage in routine exercise.  She endorses overall sensation of stress related to having to work in the presence of fee Past Medical History:  Diagnosis Date   COVID-19    GERD (gastroesophageal reflux disease)    Hypertension    not currently on medications    Patient Active Problem List   Diagnosis Date Noted   DUB (dysfunctional uterine bleeding) 08/10/2020   Dermatophytosis 05/25/2020   Obesity 04/28/2018   Anxiety 10/04/2016    Past Surgical History:  Procedure Laterality Date   BREAST LUMPECTOMY     DILATATION AND CURETTAGE/HYSTEROSCOPY WITH MINERVA N/A 07/30/2018   Procedure: DILATATION AND CURETTAGE/HYSTEROSCOPY WITH MINERVA;  Surgeon: Emily Filbert, MD;  Location: [facility];  Service: Gynecology;  Laterality: N/A;   TUBAL LIGATION      OB History     Gravida  4   Para  3   Term  3   Preterm      AB  1   Living  3      SAB      IAB  1   Ectopic      Multiple      Live  Births  3            Home Medications    Prior to Admission medications   Medication Sig Start Date End Date Taking? Authorizing Provider  megestrol (MEGACE) 40 MG tablet Take 1 tablet (40 mg total) by mouth daily. 08/10/20  Yes Chancy Milroy, MD  amLODipine (NORVASC) 5 MG tablet Take 1 tablet (5 mg total) by mouth daily. Patient not taking: No sig reported 03/23/20   Scot Jun, FNP  megestrol (MEGACE) 20 MG tablet Take 2 tablets (40 mg total) by mouth 2 (two) times daily for 21 days. 08/07/20 08/28/20  Melynda Ripple, MD    Family History Family History  Problem Relation Age of Onset   Asthma Mother     Social History Social History   Tobacco Use   Smoking status: Never   Smokeless tobacco: Never  Vaping Use   Vaping Use: Never used  Substance Use Topics   Alcohol use: No   Drug use: No     Allergies   Aspirin and Atenolol   Review of Systems Review of Systems Pertinent negatives listed in HPI   Physical Exam Triage Vital Signs ED Triage Vitals  Enc Vitals Group     BP 08/20/20 1358 Marland Kitchen)  150/98     Pulse Rate 08/20/20 1358 (!) 106     Resp 08/20/20 1358 18     Temp 08/20/20 1358 98.2 F (36.8 C)     Temp Source 08/20/20 1358 Oral     SpO2 08/20/20 1358 98 %     Weight --      Height --      Head Circumference --      [facility] Flow --      Pain Score 08/20/20 1400 0     Pain Loc --      Pain Edu? --      Excl. in [place]? --    No data found.  Updated Vital Signs BP (!) 150/98 (BP Location: Left Arm)   Pulse (!) 106   Temp 98.2 F (36.8 C) (Oral)   Resp 18   LMP 08/02/2020 (Exact Date)   SpO2 98%   Visual Acuity Right Eye Distance:   Left Eye Distance:   Bilateral Distance:    Right Eye Near:   Left Eye Near:    Bilateral Near:     Physical Exam General appearance: Alert, well developed, well nourished, cooperative  Head: Normocephalic, without obvious abnormality, atraumatic Respiratory: Respirations even and unlabored, normal  respiratory rate Heart: Rate and rhythm normal.  Extremities: No gross deformities Skin: Skin color, texture, turgor normal. No rashes seen  Psych: Appropriate mood and affect. Neurologic: GCS 15 normal coordination normal gait. Mental status: Flat appearance with tearfulness, normal judgment normal cognition UC Treatments / Results  Labs (all labs ordered are listed, but only abnormal results are displayed) Labs Reviewed - No data to display  EKG   Radiology   Procedures Procedures (including critical care time)  Medications Ordered in UC Medications - No data to display  Initial Impression / Assessment and Plan / UC Course  I have reviewed the triage vital signs and the nursing notes.  Pertinent labs & imaging results that were available during my care of the patient were reviewed by me and considered in my medical decision making (see chart for details).    Patient in today with a complaint of nonspecific fatigue and feeling low of energy.  Patient has had recent abnormal vaginal bleeding however has had a normal CBC performed within the last 2 weeks and bleeding has been controlled with Megace.  Suspect symptoms are related to some underlying anxiety and possible menopausal related symptoms causing hot flashes and fatigue.  Advised patient to follow-up with her OB/GYN for recommendations for management of menopausal type symptoms as far as fatigue advised to continue to multivitamin, recommend engagement in physical activity along with increasing protein in her diet as she has not been eating appropriately due to lack of appetite.  Patient is to see her OB/GYN on 09/01/2020.  Advised to keep appointment and to follow-up with her primary care doctor as needed.  Final Clinical Impressions(s) / UC Diagnoses   Final diagnoses:  Fatigue, unspecified type  Low energy     Discharge Instructions      Encourage slowly introducing physical activity into your daily routine.   Recommend protein shakes at least twice a day and increase intake of fruits and vegetables.  Continue daily vitamin.  Continue to hydrate with water. Try melatonin which can help with relaxation and rest at bedtime.  Keep follow-up with OB/GYN to discuss different options for relief of vaginal bleeding and menopausal type symptoms.  Also follow-up with your primary care doctor.  ED Prescriptions   None    PDMP not reviewed this encounter.   Scot Jun, FNP 08/20/20 [phone]

## 2020-08-22 ENCOUNTER — Encounter (HOSPITAL_COMMUNITY): Payer: Self-pay | Admitting: Emergency Medicine

## 2020-08-22 ENCOUNTER — Other Ambulatory Visit: Payer: Self-pay

## 2020-08-22 ENCOUNTER — Emergency Department (HOSPITAL_COMMUNITY): Payer: Commercial Managed Care - PPO

## 2020-08-22 ENCOUNTER — Emergency Department (HOSPITAL_COMMUNITY)
Admission: EM | Admit: 2020-08-22 | Discharge: 2020-08-22 | Disposition: A | Discharge status: Home / Self Care | Payer: Commercial Managed Care - PPO | Attending: Emergency Medicine | Admitting: Emergency Medicine

## 2020-08-22 DIAGNOSIS — Z8616 Personal history of COVID-19: Secondary | ICD-10-CM | POA: Insufficient documentation

## 2020-08-22 DIAGNOSIS — R109 Unspecified abdominal pain: Secondary | ICD-10-CM | POA: Diagnosis present

## 2020-08-22 DIAGNOSIS — R61 Generalized hyperhidrosis: Secondary | ICD-10-CM | POA: Insufficient documentation

## 2020-08-22 DIAGNOSIS — I10 Essential (primary) hypertension: Secondary | ICD-10-CM | POA: Diagnosis not present

## 2020-08-22 DIAGNOSIS — Z8742 Personal history of other diseases of the female genital tract: Secondary | ICD-10-CM | POA: Diagnosis not present

## 2020-08-22 DIAGNOSIS — R110 Nausea: Secondary | ICD-10-CM | POA: Diagnosis not present

## 2020-08-22 DIAGNOSIS — R531 Weakness: Secondary | ICD-10-CM | POA: Diagnosis not present

## 2020-08-22 DIAGNOSIS — Z79899 Other long term (current) drug therapy: Secondary | ICD-10-CM | POA: Insufficient documentation

## 2020-08-22 DIAGNOSIS — K219 Gastro-esophageal reflux disease without esophagitis: Secondary | ICD-10-CM | POA: Diagnosis not present

## 2020-08-22 DIAGNOSIS — R5383 Other fatigue: Secondary | ICD-10-CM | POA: Diagnosis not present

## 2020-08-22 LAB — URINALYSIS, ROUTINE W REFLEX MICROSCOPIC
Bilirubin Urine: NEGATIVE
Glucose, UA: NEGATIVE mg/dL
Ketones, ur: NEGATIVE mg/dL
Nitrite: NEGATIVE
Protein, ur: NEGATIVE mg/dL
Specific Gravity, Urine: 1.003 — ABNORMAL LOW (ref 1.005–1.030)
pH: 7 (ref 5.0–8.0)

## 2020-08-22 LAB — COMPREHENSIVE METABOLIC PANEL
ALT: 24 U/L (ref 0–44)
AST: 28 U/L (ref 15–41)
Albumin: 4.3 g/dL (ref 3.5–5.0)
Alkaline Phosphatase: 45 U/L (ref 38–126)
Anion gap: 9 (ref 5–15)
BUN: 9 mg/dL (ref 6–20)
CO2: 23 mmol/L (ref 22–32)
Calcium: 9.3 mg/dL (ref 8.9–10.3)
Chloride: 105 mmol/L (ref 98–111)
Creatinine, Ser: 0.97 mg/dL (ref 0.44–1.00)
GFR, Estimated: >60 mL/min (ref >60)
Glucose, Bld: 143 mg/dL — ABNORMAL HIGH (ref 70–99)
Potassium: 4 mmol/L (ref 3.5–5.1)
Sodium: 137 mmol/L (ref 135–145)
Total Bilirubin: 1.3 mg/dL — ABNORMAL HIGH (ref 0.3–1.2)
Total Protein: 8.7 g/dL — ABNORMAL HIGH (ref 6.5–8.1)

## 2020-08-22 LAB — CBC WITH DIFFERENTIAL/PLATELET
Abs Immature Granulocytes: 0.01 K/uL (ref 0.00–0.07)
Basophils Absolute: 0 K/uL (ref 0.0–0.1)
Basophils Relative: 0 %
Eosinophils Absolute: 0 K/uL (ref 0.0–0.5)
Eosinophils Relative: 0 %
HCT: 42.6 % (ref 36.0–46.0)
Hemoglobin: 14.2 g/dL (ref 12.0–15.0)
Immature Granulocytes: 0 %
Lymphocytes Relative: 18 %
Lymphs Abs: 1.2 K/uL (ref 0.7–4.0)
MCH: 29.3 pg (ref 26.0–34.0)
MCHC: 33.3 g/dL (ref 30.0–36.0)
MCV: 87.8 fL (ref 80.0–100.0)
Monocytes Absolute: 0.5 K/uL (ref 0.1–1.0)
Monocytes Relative: 7 %
Neutro Abs: 5 K/uL (ref 1.7–7.7)
Neutrophils Relative %: 75 %
Platelets: 231 K/uL (ref 150–400)
RBC: 4.85 MIL/uL (ref 3.87–5.11)
RDW: 13.2 % (ref 11.5–15.5)
WBC: 6.7 K/uL (ref 4.0–10.5)
nRBC: 0 % (ref 0.0–0.2)

## 2020-08-22 LAB — I-STAT BETA HCG BLOOD, ED (MC, WL, AP ONLY): I-stat hCG, quantitative: <5 m[IU]/mL

## 2020-08-22 LAB — LIPASE, BLOOD: Lipase: 30 U/L (ref 11–51)

## 2020-08-22 MED ORDER — HYDROXYZINE HCL 25 MG PO TABS
25.0000 mg | ORAL_TABLET | Freq: Once | ORAL | Status: AC
  Administered 2020-08-22: 25 mg via ORAL
  Filled 2020-08-22: qty 1

## 2020-08-22 MED ORDER — IOHEXOL 350 MG/ML SOLN
100.0000 mL | Freq: Once | INTRAVENOUS | Status: AC | PRN
  Administered 2020-08-22: 80 mL via INTRAVENOUS

## 2020-08-22 MED ORDER — SODIUM CHLORIDE 0.9 % IV BOLUS
1000.0000 mL | Freq: Once | INTRAVENOUS | Status: AC
  Administered 2020-08-22: 1000 mL via INTRAVENOUS

## 2020-08-22 NOTE — ED Provider Notes (Signed)
[facility] DEPT Provider Note   CSN: [id number] Arrival date & time: 08/22/20  0645     History Chief Complaint  Patient presents with   Abdominal Pain   generalized weakness    Kristine Kelly is a 50 y.o. female.  The history is provided by the patient and medical records. No language interpreter was used.  Abdominal Pain  50 year old female significant history of dysfunctional uterine bleeding, hypertension, GERD, anxiety, obesity presenting complaining of abdominal pain.  Patient report for the past 5 days she has had no appetite, endorse generalized weakness and fatigue, having pain to the right side of her abdomen and mid abdomen which she describes as sharp crampy sensation moderate intensity Progressively worse.  Endorses nausea without vomiting.  Reports decreased bowel movement but able to pass gas.  She has been feeling more anxious.  She denies fever chest pain shortness of breath dysuria hematuria.  She has had heavy abnormal uterine bleeding and currently taking megestrol.  As she was seen at urgent care center 2 days ago for her complaint and at that time she had a pelvic ultrasound with some abnormalities.  She is scheduled to follow-up with her OB/GYN in the near future.  She is still concerned about abdominal pain.  She does not have an active cancer and denies alcohol or tobacco use.  She endorsed having night sweats and overall not feeling well.  No specific treatment tried at home.       Past Medical History:  Diagnosis Date   COVID-19    GERD (gastroesophageal reflux disease)    Hypertension    not currently on medications    Patient Active Problem List   Diagnosis Date Noted   DUB (dysfunctional uterine bleeding) 08/10/2020   Dermatophytosis 05/25/2020   Obesity 04/28/2018   Anxiety 10/04/2016    Past Surgical History:  Procedure Laterality Date   BREAST LUMPECTOMY     DILATATION AND CURETTAGE/HYSTEROSCOPY WITH MINERVA  N/A 07/30/2018   Procedure: DILATATION AND CURETTAGE/HYSTEROSCOPY WITH MINERVA;  Surgeon: Emily Filbert, MD;  Location: [facility];  Service: Gynecology;  Laterality: N/A;   TUBAL LIGATION       OB History     Gravida  4   Para  3   Term  3   Preterm      AB  1   Living  3      SAB      IAB  1   Ectopic      Multiple      Live Births  3           Family History  Problem Relation Age of Onset   Asthma Mother     Social History   Tobacco Use   Smoking status: Never   Smokeless tobacco: Never  Vaping Use   Vaping Use: Never used  Substance Use Topics   Alcohol use: No   Drug use: No    Home Medications Prior to Admission medications   Medication Sig Start Date End Date Taking? Authorizing Provider  amLODipine (NORVASC) 5 MG tablet Take 1 tablet (5 mg total) by mouth daily. Patient not taking: No sig reported 03/23/20   Scot Jun, FNP  megestrol (MEGACE) 20 MG tablet Take 2 tablets (40 mg total) by mouth 2 (two) times daily for 21 days. 08/07/20 08/28/20  Melynda Ripple, MD  megestrol (MEGACE) 40 MG tablet Take 1 tablet (40 mg total) by mouth daily. 08/10/20  Chancy Milroy, MD    Allergies    Aspirin and Atenolol  Review of Systems   Review of Systems  Gastrointestinal:  Positive for abdominal pain.  All other systems reviewed and are negative.  Physical Exam Updated Vital Signs BP (!) 130/104   Pulse 94   Temp 98.4 F (36.9 C) (Oral)   Resp 16   Ht 5' 6.6" (1.692 m)   Wt 115.2 kg   LMP 08/02/2020 (Exact Date)   SpO2 99%   BMI 40.26 kg/m   Physical Exam Vitals and nursing note reviewed.  Constitutional:      General: She is not in acute distress.    Appearance: She is well-developed. She is obese.  HENT:     Head: Atraumatic.  Eyes:     Conjunctiva/sclera: Conjunctivae normal.  Cardiovascular:     Rate and Rhythm: Normal rate and regular rhythm.  Pulmonary:     Effort: Pulmonary effort is normal.   Abdominal:     Palpations: Abdomen is soft.     Tenderness: There is abdominal tenderness (diffuse abdominal tenderness without guarding or rebound tenderness.).  Musculoskeletal:     Cervical back: Neck supple.     Comments: 5/5 strength to all 4 extremities  Skin:    Findings: No rash.  Neurological:     Mental Status: She is alert. Mental status is at baseline.  Psychiatric:        Mood and Affect: Mood normal.    ED Results / Procedures / Treatments   Labs (all labs ordered are listed, but only abnormal results are displayed) Labs Reviewed  COMPREHENSIVE METABOLIC PANEL - Abnormal; Notable for the following components:      Result Value   Glucose, Bld 143 (*)    Total Protein 8.7 (*)    Total Bilirubin 1.3 (*)    All other components within normal limits  URINALYSIS, ROUTINE W REFLEX MICROSCOPIC - Abnormal; Notable for the following components:   Color, Urine STRAW (*)    Specific Gravity, Urine 1.003 (*)    Hgb urine dipstick MODERATE (*)    Leukocytes,Ua TRACE (*)    Bacteria, UA RARE (*)    All other components within normal limits  CBC WITH DIFFERENTIAL/PLATELET  LIPASE, BLOOD  I-STAT BETA HCG BLOOD, ED ([facility], [facility], [facility] ONLY)    EKG None  Radiology CT ABDOMEN PELVIS W CONTRAST  Result Date: 08/22/2020 CLINICAL DATA:  Acute nonlocalized abdominal pain. EXAM: CT ABDOMEN AND PELVIS WITH CONTRAST TECHNIQUE: Multidetector CT imaging of the abdomen and pelvis was performed using the standard protocol following bolus administration of intravenous contrast. CONTRAST:  46m OMNIPAQUE IOHEXOL 350 MG/ML SOLN COMPARISON:  Ultrasound August 19, 2020, March 27, 2020 and CT chest December 18, 2019 and lumbar radiograph July 26, 2005 FINDINGS: Lower chest: No acute abnormality. Hepatobiliary: No suspicious hepatic lesion. Gallbladder surgically absent. No biliary ductal dilation. Pancreas: Within normal limits Spleen: Within normal limits Adrenals/Urinary Tract: Bilateral adrenal glands are  unremarkable. No hydronephrosis. 2 cm left interpolar renal cyst. No solid enhancing renal masses. Urinary bladder is unremarkable for degree of distension. Stomach/Bowel: Stomach is unremarkable. No pathologic dilation of small bowel. The appendix and terminal ileum appear normal. No suspicious colonic wall thickening or mass like lesions. No evidence of acute bowel inflammation. Vascular/Lymphatic: No significant vascular findings are present. No enlarged abdominal or pelvic lymph nodes. Reproductive: Unremarkable CT appearance of the uterus, better evaluated on recent pelvic ultrasound August 19, 2020. Other: No abdominopelvic ascites. 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 Large intramuscular hematoma in the right gluteal musculature on image 71/2. IMPRESSION: No acute findings in the abdomen or pelvis. Electronically Signed   By: Dahlia Bailiff MD   On: 08/22/2020 11:49    Procedures Procedures   Medications Ordered in ED Medications  sodium chloride 0.9 % bolus 1,000 mL (1,000 mLs Intravenous Bolus 08/22/20 0806)  hydrOXYzine (ATARAX/VISTARIL) tablet 25 mg (25 mg Oral Given 08/22/20 0944)  iohexol (OMNIPAQUE) 350 MG/ML injection 100 mL (80 mLs Intravenous Contrast Given 08/22/20 1101)    ED Course  I have reviewed the triage vital signs and the nursing notes.  Pertinent labs & imaging results that were available during my care of the patient were reviewed by me and considered in my medical decision making (see chart for details).    MDM Rules/Calculators/A&P                           BP 123/64 (BP Location: Left Arm)   Pulse 64    Temp 98.5 F (36.9 C) (Oral)   Resp 18   Ht 5' 6.6" (1.692 m)   Wt 115.2 kg   LMP 08/02/2020 (Exact Date)   SpO2 100%   BMI 40.26 kg/m   Final Clinical Impression(s) / ED Diagnoses Final diagnoses:  General weakness  Hx of dysfunctional uterine bleeding    Rx / DC Orders ED Discharge Orders     None      9:20 AM Patient here with generalized fatigue weakness and now complaining of abdominal pain and decrease in appetite.  Does have history of dysfunctional uterine bleeding and has been evaluated recently with a pelvic ultrasound 2 days ago which shows signs of uterine fibroid but also an endometrial lesion that would need to be assessed further.  At this can be done when she follow-up with her OB/GYN.  She also recently had TSH checked and it was normal.  Today her labs are mostly reassuring, no evidence of anemia As her hemoglobin is 14.2.  Electrolyte panels are reassuring.  Normal white count.  She does have a fairly tender abdomen on exam and after discussion, will obtain abdominal pelvis CT scan for further evaluation.  I asked for pain medication but patient declined and opted for antianxiety medication.  We will give Vistaril.  12:25 PM Pregnancy test is negative, UA shows moderate hemoglobin and urine dipsticks likely a contaminant from her vaginal bleeding.  No evidence of UTI.  Labs are overall reassuring.  Abdominal pelvis CT scan obtained without any acute finding.  Recommend pt to f/u with GYN for close monitoring and further assessment.  Return precaution given. Work note provided as request.    Domenic Moras, PA-C 08/22/20 1254    Arnaldo Natal, MD 08/22/20 1520

## 2020-08-22 NOTE — Discharge Instructions (Addendum)
Please follow up closely with OBGYN as previously scheduled for further evaluation of your condition.  Return if you have any concerns.

## 2020-08-22 NOTE — ED Triage Notes (Signed)
Pt arrived via POV with c/o generalized weakness, abdominal pain, and fatigue. Pt reports she was having heavy vaginal bleeding and prescribed a medication to stop the bleeding. Reports feeling weak since Wednesday, went to UC and "states there was nothing they could do" Denies heavy bleeding at the time but endorses intermittent nausea with eating and drinking.  Pain rated 8/10.

## 2020-08-23 ENCOUNTER — Ambulatory Visit (INDEPENDENT_AMBULATORY_CARE_PROVIDER_SITE_OTHER): Payer: Commercial Managed Care - PPO | Admitting: Family Medicine

## 2020-08-23 ENCOUNTER — Encounter: Payer: Self-pay | Admitting: Obstetrics and Gynecology

## 2020-08-23 DIAGNOSIS — F419 Anxiety disorder, unspecified: Secondary | ICD-10-CM

## 2020-08-23 DIAGNOSIS — F32A Depression, unspecified: Secondary | ICD-10-CM | POA: Diagnosis not present

## 2020-08-23 MED ORDER — SERTRALINE HCL 50 MG PO TABS: 50.0000 mg | ORAL_TABLET | Freq: Every day | ORAL | 5 refills | Status: DC

## 2020-08-23 NOTE — Progress Notes (Signed)
GYNECOLOGY OFFICE VISIT NOTE  History:   Kristine Kelly is a 50 y.o. [phone] here today for low energy.  Patient has been following with our clinic for several years for AUB Recently saw Dr. 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 [employer] Does not really have much of an appetite Does not engage in hobbies, goes from work to home and still takes care of her adult children Not sleeping well at all Denies feeling depressed  Health Maintenance Due  Topic Date Due   COVID-19 Vaccine (1) Never done   COLONOSCOPY (Pts 45-20yr Insurance coverage will need to be confirmed)  Never done   PAP SMEAR-Modifier  11/16/2019   MAMMOGRAM  Never done   Zoster Vaccines- Shingrix (1 of 2) Never done   INFLUENZA VACCINE  08/22/2020    Past Medical History:  Diagnosis Date   COVID-19    GERD (gastroesophageal reflux disease)    Hypertension    not currently on medications    Past Surgical History:  Procedure Laterality Date   BREAST LUMPECTOMY     DILATATION AND CURETTAGE/HYSTEROSCOPY WITH MINERVA N/A 07/30/2018   Procedure: DILATATION AND CURETTAGE/HYSTEROSCOPY WITH MINERVA;  Surgeon: DEmily Filbert MD;  Location: [facility]  Service: Gynecology;  Laterality: N/A;   TUBAL LIGATION      The following portions of the patient's history were reviewed and updated as appropriate: allergies, current medications, past family history, past medical history, past social history, past surgical history and problem list.   Health Maintenance:   Last pap: No results found for: DIAGPAP, HPV,  HPVHIGH   Last mammogram:  None, scheduled for 09/23/20   Review of Systems:  Pertinent items noted in HPI and remainder of comprehensive ROS otherwise negative.  Physical Exam:  LMP 08/02/2020 (Exact Date)  CONSTITUTIONAL: Well-developed, well-nourished female in no acute distress.  HEENT:  Normocephalic, atraumatic. External right and left ear normal. No scleral icterus.  NECK: Normal range of motion, supple, no masses noted on observation SKIN: No rash noted. Not diaphoretic. No erythema. No pallor. MUSCULOSKELETAL: Normal range of motion. No edema noted. NEUROLOGIC: Alert and oriented to person, place, and time. Normal muscle tone coordination.  RESPIRATORY: Effort normal, no problems with respiration noted PSYCHIATRIC: Anxious and depressed affect. Reportedly normal mood. Normal behavior. Normal judgment and thought content.  Labs and Imaging Results for orders placed or performed during the hospital encounter of 08/22/20 (from the past 168 hour(s))  CBC with Differential   Collection Time: 08/22/20  7:53 AM  Result Value Ref Range   WBC 6.7 4.0 - 10.5 K/uL   RBC 4.85 3.87 - 5.11 MIL/uL   Hemoglobin 14.2 12.0 - 15.0 g/dL   HCT 42.6 36.0 - 46.0 %   MCV 87.8 80.0 - 100.0 fL   MCH 29.3 26.0 - 34.0 pg   MCHC 33.3 30.0 - 36.0 g/dL   RDW 13.2 11.5 - 15.5 %   Platelets 231 150 - 400 K/uL   nRBC 0.0 0.0 - 0.2 %   Neutrophils Relative % 75 %  Neutro Abs 5.0 1.7 - 7.7 K/uL   Lymphocytes Relative 18 %   Lymphs Abs 1.2 0.7 - 4.0 K/uL   Monocytes Relative 7 %   Monocytes Absolute 0.5 0.1 - 1.0 K/uL   Eosinophils Relative 0 %   Eosinophils Absolute 0.0 0.0 - 0.5 K/uL   Basophils Relative 0 %   Basophils Absolute 0.0 0.0 - 0.1 K/uL   Immature Granulocytes 0 %   Abs Immature Granulocytes 0.01 0.00 - 0.07 K/uL  Comprehensive metabolic panel   Collection Time: 08/22/20  7:53 AM  Result Value Ref Range   Sodium 137 135 - 145 mmol/L   Potassium 4.0 3.5 - 5.1 mmol/L   Chloride 105  98 - 111 mmol/L   CO2 23 22 - 32 mmol/L   Glucose, Bld 143 (H) 70 - 99 mg/dL   BUN 9 6 - 20 mg/dL   Creatinine, Ser 0.97 0.44 - 1.00 mg/dL   Calcium 9.3 8.9 - 10.3 mg/dL   Total Protein 8.7 (H) 6.5 - 8.1 g/dL   Albumin 4.3 3.5 - 5.0 g/dL   AST 28 15 - 41 U/L   ALT 24 0 - 44 U/L   Alkaline Phosphatase 45 38 - 126 U/L   Total Bilirubin 1.3 (H) 0.3 - 1.2 mg/dL   GFR, Estimated >60 >60 mL/min   Anion gap 9 5 - 15  Lipase, blood   Collection Time: 08/22/20  7:53 AM  Result Value Ref Range   Lipase 30 11 - 51 U/L  Urinalysis, Routine w reflex microscopic Urine, Clean Catch   Collection Time: 08/22/20  9:03 AM  Result Value Ref Range   Color, Urine STRAW (A) YELLOW   APPearance CLEAR CLEAR   Specific Gravity, Urine 1.003 (L) 1.005 - 1.030   pH 7.0 5.0 - 8.0   Glucose, UA NEGATIVE NEGATIVE mg/dL   Hgb urine dipstick MODERATE (A) NEGATIVE   Bilirubin Urine NEGATIVE NEGATIVE   Ketones, ur NEGATIVE NEGATIVE mg/dL   Protein, ur NEGATIVE NEGATIVE mg/dL   Nitrite NEGATIVE NEGATIVE   Leukocytes,Ua TRACE (A) NEGATIVE   RBC / HPF 0-5 0 - 5 RBC/hpf   WBC, UA 0-5 0 - 5 WBC/hpf   Bacteria, UA RARE (A) NONE SEEN   Mucus PRESENT   I-Stat beta hCG blood, ED   Collection Time: 08/22/20 10:20 AM  Result Value Ref Range   I-stat hCG, quantitative <5.0 <5 mIU/mL   Comment 3           CT ABDOMEN PELVIS W CONTRAST  Result Date: 08/22/2020 CLINICAL DATA:  Acute nonlocalized abdominal pain. EXAM: CT ABDOMEN AND PELVIS WITH CONTRAST TECHNIQUE: Multidetector CT imaging of the abdomen and pelvis was performed using the standard protocol following bolus administration of intravenous contrast. CONTRAST:  21m OMNIPAQUE IOHEXOL 350 MG/ML SOLN COMPARISON:  Ultrasound August 19, 2020, March 27, 2020 and CT chest December 18, 2019 and lumbar radiograph July 26, 2005 FINDINGS: Lower chest: No acute abnormality. Hepatobiliary: No suspicious hepatic lesion. Gallbladder surgically absent. No biliary ductal dilation.  Pancreas: Within normal limits Spleen: Within normal limits Adrenals/Urinary Tract: Bilateral adrenal glands are unremarkable. No hydronephrosis. 2 cm left interpolar renal cyst. No solid enhancing renal masses. Urinary bladder is unremarkable for degree of distension. Stomach/Bowel: Stomach is unremarkable. No pathologic dilation of small bowel. The appendix and terminal ileum appear normal. No suspicious colonic wall thickening or mass like lesions. No evidence of acute bowel inflammation. Vascular/Lymphatic: No significant vascular findings are present. No enlarged abdominal or  pelvic lymph nodes. Reproductive: Unremarkable CT appearance of the uterus, better evaluated on recent pelvic ultrasound August 19, 2020. Other: No abdominopelvic ascites. 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 Large intramuscular hematoma in the right gluteal musculature on image 71/2. IMPRESSION: No acute findings in the abdomen or pelvis. Electronically Signed   By: Dahlia Bailiff MD   On: 08/22/2020 11:49   US PELVIC COMPLETE WITH TRANSVAGINAL  Result Date: 08/19/2020 CLINICAL DATA:  Initial evaluation for dysfunctional uterine bleeding. EXAM: TRANSABDOMINAL AND TRANSVAGINAL ULTRASOUND OF PELVIS TECHNIQUE: Both transabdominal and transvaginal ultrasound examinations of the pelvis were performed. Transabdominal technique was performed for global imaging of the pelvis including uterus, ovaries, adnexal regions, and pelvic cul-de-sac. It was necessary to proceed with endovaginal exam following the transabdominal exam to visualize the uterus, endometrium, and ovaries.  COMPARISON:  Ultrasound from 05/07/2018. FINDINGS: Uterus Measurements: 7.5 x 3.9 x 5.5 cm = volume: 81.9 mL. Uterus is anteverted. 3.4 x 3.0 x 2.2 cm intramural fibroid seen at the left uterine fundus. 2.1 x 2.1 x 2.1 cm subserosal fibroid present at the right uterine fundus. Morphologic changes suggestive of underlying uterine adenomyosis. Few nabothian cysts noted about the cervix. Endometrium Thickness: 8 mm. Endometrial complex somewhat heterogeneous and poorly defined without focal abnormality or definite vascularity. Right ovary Not visualized.  No adnexal mass. Left ovary Not visualized.  No adnexal mass. Other findings No abnormal free fluid. IMPRESSION: 1. Heterogeneous endometrial complex measuring 8 mm in thickness. If bleeding remains unresponsive to hormonal or medical therapy, sonohysterogram should be considered for focal lesion work-up. (Ref: Radiological Reasoning: Algorithmic Workup of Abnormal Vaginal Bleeding with Endovaginal Sonography and Sonohysterography. AJR [id number]). 2. Two discrete fibroids measuring up to 3.4 cm about the uterine fundus with underlying morphological changes suggestive of possible uterine adenomyosis. 3. Nonvisualization of either ovary.  No adnexal mass or free fluid. Electronically Signed   By: Jeannine Boga M.D.   On: 08/19/2020 20:06      Assessment and Plan:   Problem List Items Addressed This Visit       Other   Anxiety and depression - Primary    Reviewed with patient that organic causes of her fatigue do not appear to be contributing given normal CBC w diff / CMP / TSH. Patient appears anxious and depressed on exam and is endorsing multiple depression symptoms. We spent a long time discussing the mind body connection and how low energy can be driven in large part by anxiety/depression. She was not totally convinced about this diagnosis. She is feeling unable to work and I am OK with writing her out of work until her visit with Dr. Rip Harbour  next week as long as she start to engage in treatment for her mental health. She was amenable to this plan, work letter provided, rx sent for zoloft, referral placed for [facility].        Relevant Medications   sertraline (ZOLOFT) 50 MG tablet   Other Relevant Orders   Ambulatory referral to [facility]    Routine preventative health maintenance measures emphasized. Please refer to After Visit Summary for other counseling recommendations.   Return in about 1 week (around 08/30/2020) for discuss hysterectomy.  Total face-to-face time with patient: 20 minutes.  Over 50% of encounter was spent on counseling and coordination of care.   Clarnce Flock, MD/MPH Attending Family Medicine Physician, [facility] for [facility], [place]

## 2020-08-23 NOTE — Assessment & Plan Note (Signed)
Reviewed with patient that organic causes of her fatigue do not appear to be contributing given normal CBC w diff / CMP / TSH. Patient appears anxious and depressed on exam and is endorsing multiple depression symptoms. We spent a long time discussing the mind body connection and how low energy can be driven in large part by anxiety/depression. She was not totally convinced about this diagnosis. She is feeling unable to work and I am OK with writing her out of work until her visit with Dr. Rip Harbour next week as long as she start to engage in treatment for her mental health. She was amenable to this plan, work letter provided, rx sent for zoloft, referral placed for [facility].

## 2020-08-24 NOTE — BH Specialist Note (Signed)
Integrated Behavioral Health Initial In-Person Visit  MRN: [id number] Name: Kristine Kelly  Number of [facility] Clinician visits:: 1/6 Session Start time: 2:30  Session End time: 2:45 Total time: 15 minutes  Types of Service: Individual psychotherapy  Interpretor:No. Interpretor Name and Language: n/a   Warm Hand Off Completed.        Subjective: [name] is a 50 y.o. female accompanied by  n/a Patient was referred by Clayton Lefort, MD for symptoms of depression/anxiety. Patient reports the following symptoms/concerns: Pt states her primary concern today is feeling stressed and anxious over feeling fatigue, poor appetite, hot flashes keeping her up at night, as well as stress and worry over job security with current health issues, and financial stress over medical copays. Pt denies feeling depressed; does endorse increase in feeling stressed.  Duration of problem: Increase over time; Severity of problem: moderate  Objective: Mood: Anxious and Affect: Tearful Risk of harm to self or others: No plan to harm self or others  Life Context: Family and Social: Pt has three grown children; two grandchildren (oldest son lives with her) School/Work: Working full-time Self-Care: - Life Changes: Work/financial stress; perimenopause  Patient and/or Family's Strengths/Protective Factors: Concrete supports in place (healthy food, safe environments, etc.) and Sense of purpose  Goals Addressed: Patient will: Reduce symptoms of: anxiety and stress Increase knowledge and/or ability of: coping skills and stress reduction  Demonstrate ability to: Increase healthy adjustment to current life circumstances  Progress towards Goals: Ongoing  Interventions: Interventions utilized: Mindfulness or Psychologist, educational, Sleep Hygiene, and Psychoeducation and/or Health Education  Standardized Assessments completed: Not Needed  Patient and/or Family Response: Pt agrees  with treatment plan  Patient Centered Plan: Patient is on the following Treatment Plan(s):  IBH  Assessment: Patient currently experiencing Other specified counseling.   Patient may benefit from psychoeducation and brief therapeutic interventions regarding coping with symptoms of anxiety and stress .  Plan: Follow up with behavioral health clinician on : Roselyn Reef will call in two weeks for brief check; Call Roselyn Reef as needed at [phone] Behavioral recommendations:  -Continue taking Zoloft as prescribed -CALM relaxation breathing exercise twice daily (morning; at bedtime with sleep sounds) -Consider community resources on After Visit Summary for help with utilities, as needed Referral(s): Integrated Orthoptist (In Clinic) and [employer] Resources:  [place], [place]

## 2020-09-01 ENCOUNTER — Ambulatory Visit (INDEPENDENT_AMBULATORY_CARE_PROVIDER_SITE_OTHER): Payer: Commercial Managed Care - PPO | Admitting: Obstetrics and Gynecology

## 2020-09-01 ENCOUNTER — Encounter: Payer: Self-pay | Admitting: Obstetrics and Gynecology

## 2020-09-01 ENCOUNTER — Ambulatory Visit: Payer: Commercial Managed Care - PPO | Admitting: Clinical

## 2020-09-01 ENCOUNTER — Other Ambulatory Visit: Payer: Self-pay

## 2020-09-01 VITALS — BP 138/86 | HR 114 | Wt 242.5 lb

## 2020-09-01 DIAGNOSIS — N951 Menopausal and female climacteric states: Secondary | ICD-10-CM

## 2020-09-01 DIAGNOSIS — Z7189 Other specified counseling: Secondary | ICD-10-CM

## 2020-09-01 DIAGNOSIS — N938 Other specified abnormal uterine and vaginal bleeding: Secondary | ICD-10-CM

## 2020-09-01 MED ORDER — PREMPRO 0.625-5 MG PO TABS: 1.0000 | ORAL_TABLET | Freq: Every day | ORAL | 5 refills | Status: DC

## 2020-09-01 NOTE — Patient Instructions (Signed)
Center for [facility] at [facility] [place], [place] [address] [phone] (main office) [phone] ([place] office)    Addis and Websites Here are a few free apps meant to help you to help yourself.  To find, try searching on the internet to see if the app is offered on Apple/Android devices. If your first choice doesn't come up on your device, the good news is that there are many choices! Play around with different apps to see which ones are helpful to you.    Calm This is an app meant to help increase calm feelings. Includes info, strategies, and tools for tracking your feelings.      Calm Harm  This app is meant to help with self-harm. Provides many 5-minute or 15-min coping strategies for doing instead of hurting yourself.       [place] is a problem-solving tool to help deal with emotions and cope with stress you encounter wherever you are.      MindShift This app can help people cope with anxiety. Rather than trying to avoid anxiety, you can make an important shift and face it.      MY3  MY3 features a support system, safety plan and resources with the goal of offering a tool to use in a time of need.       My Life My Voice  This mood journal offers a simple solution for tracking your thoughts, feelings and moods. Animated emoticons can help identify your mood.       Relax Melodies Designed to help with sleep, on this app you can mix sounds and meditations for relaxation.      Smiling Mind Smiling Mind is meditation made easy: it's a simple tool that helps put a smile on your mind.        Stop, Breathe & Think  A friendly, simple guide for people through meditations for mindfulness and compassion.  Stop, Breathe and Think Kids Enter your current feelings and choose a "mission" to help you cope. Offers videos for certain moods instead of just sound recordings.        Team Orange The goal of this tool is to help teens change how they think, act, and react. This app helps you focus on your own good feelings and experiences.      The [place] Box The [place] Box (VHB) contains simple tools to help patients with coping, relaxation, distraction, and positive thinking.    Coping with Panic Attacks   What is a panic attack?  You may have had a panic attack if you experienced four or more of the symptoms listed below coming on abruptly and peaking in about 10 minutes.  Panic Symptoms    Pounding heart   Sweating   Trembling or shaking   Shortness of breath   Feeling of choking   Chest pain   Nausea or abdominal distress     Feeling dizzy, unsteady, lightheaded, or faint   Feelings of unreality or being detached from yourself   Fear of losing control or going crazy   Fear of dying   Numbness or tingling   Chills or hot flashes      Panic attacks are sometimes accompanied by avoidance of certain places or situations. These are often situations that would be difficult to escape from or in which help might not be available. Examples might include crowded shopping malls, public transportation, restaurants, or driving.  Why do panic attacks occur?   Panic attacks are the body's alarm system gone awry. All of [place] have a built-in alarm system, powered by adrenaline, which increases our heart rate, breathing, and blood flow in response to danger. Ordinarily, this 'danger response system' works well. In some people, however, the response is either out of proportion to whatever stress is going on, or may come out of the blue without any stress at all.   For example, if you are walking in the woods and see a bear coming your way, a variety of changes occur in your body to prepare you to either fight the danger or flee from the situation. Your heart rate will increase to get more blood flow around your body, your breathing rate will quicken so that more  oxygen is available, and your muscles will tighten in order to be ready to fight or run. You may feel nauseated as blood flow leaves your stomach area and moves into your limbs. These bodily changes are all essential to helping you survive the dangerous situation. After the danger has passed, your body functions will begin to go back to normal. This is because your body also has a system for "recovering" by bringing your body back down to a normal state when the danger is over.   As you can see, the emergency response system is adaptive when there is, in fact, a "true" or "real" danger (e.g., bear). However, sometimes people find that their emergency response system is triggered in "everyday" situations where there really is no true physical danger (e.g., in a meeting, in the grocery store, while driving in normal traffic, etc.).   What triggers a panic attack?  Sometimes particularly stressful situations can trigger a panic attack. For example, an argument with your spouse or stressors at work can cause a stress response (activating the emergency response system) because you perceive it as threatening or overwhelming, even if there is no direct risk to your survival.  Sometimes panic attacks don't seem to be triggered by anything in particular- they may "come out of the blue". Somehow, the natural "fight or flight" emergency response system has gotten activated when there is no real danger. Why does the body go into "emergency mode" when there is no real danger?   Often, people with panic attacks are frightened or alarmed by the physical sensations of the emergency response system. First, unexpected physical sensations are experienced (tightness in your chest or some shortness of breath). This then leads to feeling fearful or alarmed by these symptoms ("Something's wrong!", "Am I having a heart attack?", "Am I going to faint?") The mind perceives that there is a danger even though no real danger exists.  This, in turn, activates the emergency response system ("fight or flight"), leading to a "full blown" panic attack. In summary, panic attacks occur when we misinterpret physical symptoms as signs of impending death, craziness, loss of control, embarrassment, or fear of fear. Sometimes you may be aware of thoughts of danger that activate the emergency response system (for example, thinking "I'm having a heart attack" when you feel chest pressure or increased heart rate). At other times, however, you may not be aware of such thoughts. After several incidences of being afraid of physical sensations, anxiety and panic can occur in response to the initial sensations without conscious thoughts of danger. Instead, you just feel afraid or alarmed. In other words, the panic or fear may seem to occur "automatically" without you consciously telling  yourself anything.   After having had one or more panic attacks, you may also become more focused on what is going on inside your body. You may scan your body and be more vigilant about noticing any symptoms that might signal the start of a panic attack. This makes it easier for panic attacks to happen again because you pick up on sensations you might otherwise not have noticed, and misinterpret them as something dangerous. A panic attack may then result.      How do I cope with panic attacks?  An important part of overcoming panic attacks involves re-interpreting your body's physical reactions and teaching yourself ways to decrease the physical arousal. This can be done through practicing the cognitive and behavioral interventions below.   Research has found that over half of people who have panic attacks show some signs of hyperventilation or overbreathing. This can produce initial sensations that alarm you and lead to a panic attack. Overbreathing can also develop as part of the panic attack and make the symptoms worse. When people hyperventilate, certain blood vessels  in the body become narrower. In particular, the brain may get slightly less oxygen. This can lead to the symptoms of dizziness, confusion, and lightheadedness that often occur during panic attacks. Other parts of the body may also get a bit less oxygen, which may lead to numbness or tingling in the hands or feet or the sensation of cold, clammy hands. It also may lead the heart to pump harder. Although these symptoms may be frightening and feel unpleasant, it is important to remember that hyperventilating is not dangerous. However, you can help overcome the unpleasantness of overbreathing by practicing Breathing Retraining.   Practice this basic technique three times a day, every day:   Inhale. With your shoulders relaxed, inhale as slowly and deeply as you can while you count to six. If you can, use your diaphragm to fill your lungs with air.   Hold. Keep the air in your lungs as you slowly count to four.   Exhale. Slowly breath out as you count to six.   Repeat. Do the inhale-hold-exhale cycle several times. Each time you do it, exhale for longer counts.  Like any new skill, Breathing Retraining requires practice. Try practicing this skill twice a day for several minutes. Initially, do not try this technique in specific situations or when you become frightened or have a panic attack. Begin by practicing in a quiet environment to build up your skill level so that you can later use it in time of "emergency."   2. Decreasing Avoidance  Regardless of whether you can identify why you began having panic attacks or whether they seemed to come out of the blue, the places where you began having panic attacks often can become triggers themselves. It is not uncommon for individuals to begin to avoid the places where they have had panic attacks. Over time, the individual may begin to avoid more and more places, thereby decreasing their activities and often negatively impacting their quality of life. To break the  cycle of avoidance, it is important to first identify the places or situations that are being avoided, and then to do some "relearning."  To begin this intervention, first create a list of locations or situations that you tend to avoid. Then choose an avoided location or situation that you would like to target first. Now develop an "exposure hierarchy" for this situation or location. An "exposure hierarchy" is a list of actions that  make you feel anxious in this situation. Order these actions from least to most anxiety-producing. It is often helpful to have the first item on your hierarchy involve thinking or imagining part of the feared/avoided situation.   Here is an example of an exposure hierarchy for decreasing avoidance of the grocery store. Note how it is ordered from the least amount of anxiety (at the top) to the most anxiety (at the bottom):   Think about going to the grocery store alone.   Go to the grocery store with a friend or family member.   Go to the grocery store alone to pick up a few small items (5-10 minutes in the store).   Shopping for 10-20 minutes in the store alone.   Doing the shopping for the week by myself (20-30 minutes in the store).   Your homework is to "expose" yourself to the lowest item on your hierarchy and use your breathing relaxation and coping statements (see below) to help you remain in the situation. Practice this several times during the upcoming week. Once you have mastered each item with minimal anxiety, move on to the next higher action on your list.   Cognitive Interventions  Identify your negative self-talk Anxious thoughts can increase anxiety symptoms and panic. The first step in changing anxious thinking is to identify your own negative, alarming self-talk. Some common alarming thoughts:  I'm having a heart attack.            I must be going crazy. I think I'm dying. People will think I'm crazy. I'm going to pass our.  Oh no- here it comes.  I  can't stand this.  I've got to get out of here!  2. Use positive coping statements Changing or disrupting a pattern of anxious thoughts by replacing them with more calming or supportive statements can help to divert a panic attack. Some common helpful coping statements:  This is not an emergency.  I don't like feeling this way, but I can accept it.  I can feel like this and still be okay.  This has happened before, and I was okay. I'll be okay this time, too.  I can be anxious and still deal with this situation.   Housing Resources                    [employer] (serves [place], [place], [place], [place], [place], [place], [place], [place], [place], [place], [place], [place], and [place] counties) [address], [place], [place] [address] [phone] [email or web address]  **Rental assistance, Home Rehabilitation,Weatherization Assistance Program, Forensic psychologist, Housing Voucher Program  [employer] for [employer] and [employer] Studies: Chief Financial Officer Resources to residents of [place], [place], and [place] Make sure you have your documents ready, including:  (Household income verification: 2 months pay stubs, unemployment/social security award letter, statement of no income for all household members over 28) Photo ID for all household members over 18 Utility Bill/Rent Ledger/Lease: must show past due amount for utilities/rent, or the rental agreement if rent is current 2. Start your application online or by paper (in [place] or [place]) at:     [email or web address]  3. Once you have completed the online application, you will get an email confirmation message from the county. Expect to hear back by phone or email at least 6-10 weeks from submitting your application.  4. While you wait:  Call [phone] to check in on your application Let your  landlord  know that you've applied. Your landlord will be asked to submit documents (W-9) during this application process. Payments will be made directly to the landlord/property [place] or utility assistance for [employer], [place], and [place] at [email or web address] Questions? Call or email Renee at [phone] or [email or web address]   Eviction Mediation Program: The HOPE Program Https://[email or web address]://mills-[email or web address]/ HOPE Progam serves low-income renters in [place] counties, defined as less than or equal to 80% of the area median income for the county where the renter lives. In the following 12 counties, you should apply to your local rent and utility assistance program INSTEAD OF the HOPE Program: [place], [place], [place], [place], [place], [place], [place], [place], [place], [place], [place]  If you live outside of [place], contact [place] call center at [phone] to talk to a Program Representative Monday-Friday, 8am-5pm Note that Native American tribes also received federal funding for rent and utility assistance programs. Recognized members of the following tribes will be served by programs managed by tribal governments, including: [place], [place], [place], [place] of [place] and [place], [place], [phone] [email or web address] .[employer], [place], [phone] [email or web address] .[employer] [place] [address], [place], [place] [address] [phone] www.gha-[email or web address].[facility] [address] Lin [place], [place] [address] [phone] [email or web address] **Programs include: Engineer, building services and Housing Counseling, Healthy Doctor, general practice, Homeless Prevention and [place] [address], [place], [place], [place] [address] [phone] www.https://www.farmer-[email or web address].[email or web address] **housing applications/recertification; tax payment relief/exemption under specific qualifications  [facility] [address] [place], [place] [address] [email or web address] **transitional housing for women in recovery who have minor children or are pregnant  [address], [place], [place] [address] ArtistMovie.se  **emergency shelter and support services for families facing homelessness  [employer] [address] [place], [place] [address] [phone] www.youthfocus.org **transitional housing to pregnant women; emergency housing for youth who have run away, are experiencing a family crisis, are victims of abuse or neglect, or are homeless  [facility] [address] [place], [place] [address] [phone] ircgso.org **Drop-in center for people experiencing homelessness; overnight warming center when temperature is 25 degrees or below  [employer] [place], [place], [place] [address] [phone] https://[email or web address] **help with affordable housing to people experiencing homelessness or unemployment due to incarceration  [facility] [address], [place], [place] [address] [phone] www.[email or web address]  **emergency and transitional housing, rent/mortgage assistance, utility assistance  [facility] [address] [place], [place] [address] [phone] www.[email or web address] **emergency and transitional housing  [employer] for [employer] [place], [place], [place] [address] [phone] Www.[email or web address], [place], [address] [address] [phone] [email or web address]://[email or web address] **[place] and [facility]  [employer] [address] [place] [address], [place], [place] [address] [phone] [email or web address] **home buyer education courses, foreclosure prevention  [place], [place], [place] [address] [phone] WirelessNovelties.no **Environmental Exposure Assessment (investigation of homes where either children or pregnant women with a confirmed elevated blood lead level reside)  [facility] [address] [place]rita [place], [place] [address] [phone] http://www.[email or web address] **Home Expense Assistance/Repairs Program; offers home accessibility updates, such as ramps or bars in the bathroom  [employer]  [place], [place], [place] [address] [phone] [email or web address]://www.self-[email or web address]/locations/[email or web address]-branch **Offers credit-building and banking services to people unable to use traditional banking

## 2020-09-01 NOTE — Progress Notes (Signed)
Ms Kristine Kelly presents for f/u for her DUB. U/S revealed uterine fibroid, vol 82 ml H/O BTL and endometrial ablation H/O TSVD x 3 Megace is not helping. Has some degree of vaginal bleeding daily  Since last seen she has become very tried and fatigued. Unable to work because of these Sx. Saw Dr Genene Churn who placed her on Zoloft and referred to [facility]. Pt does not think she is depressed  She does report several menopausal Sx. CPE in April normal and normal labs.  PE AF  VSS Lungs clear Heart RRR Abd soft + BS  A/P  DUB         Uterine fibroids         Menopausal Sx TVH reviewed with pt. R/B/Post op care discussed. Hysterectomy papers signed today. Information provided. Will schedule.  I think the pts fatigue is related to her menopausal Sx. Tx options reviewed. Pt not taking and does not want to take Zoloft. HRT reviewed. R/B discussed. Will start Prempro. Will schedule TVH. F/U with post op appt.

## 2020-09-01 NOTE — Patient Instructions (Addendum)
Vaginal Hysterectomy, Care After The following information offers guidance on how to care for yourself after your procedure. Your health care provider may also give you more specific instructions. If you have problems or questions, contact your health careprovider. What can I expect after the procedure? After the procedure, it is common to have: Pain in the lower abdomen and vagina. Vaginal bleeding and discharge for up to 1 week. You will need to use a sanitary pad after this procedure. Difficulty having a bowel movement (constipation). Temporary problems emptying the bladder. Tiredness (fatigue). Poor appetite. Less interest in sex. Feelings of sadness or other emotions. If your ovaries were also removed, it is also common to have symptoms of menopause, such as hot flashes, night sweats, and lack of sleep (insomnia). Follow these instructions at home: Medicines  Take over-the-counter and prescription medicines only as told by your health care provider. Do not take aspirin or NSAIDs, such as ibuprofen. These medicines can cause bleeding. Ask your health care provider if the medicine prescribed to you: Requires you to avoid driving or using machinery. Can cause constipation. You may need to take these actions to prevent or treat constipation: Drink enough fluid to keep your urine pale yellow. Take over-the-counter or prescription medicines. Eat foods that are high in fiber, such as beans, whole grains, and fresh fruits and vegetables. Limit foods that are high in fat and processed sugars, such as fried or sweet foods.  Activity  Rest as told by your health care provider. Return to your normal activities as told by your health care provider. Ask your health care provider what activities are safe for you Avoid sitting for a long time without moving. Get up to take short walks every 1-2 hours. This is important to improve blood flow and breathing. Ask for help if you feel weak or  unsteady. Try to have someone home with you for 1-2 weeks to help you with everyday chores. Do not lift anything that is heavier than 10 lb (4.5 kg), or the limit that you are told, until your health care provider says that it is safe. If you were given a sedative during the procedure, it can affect you for several hours. Do not drive or operate machinery until your health care provider says that it is safe.  Lifestyle Do not use any products that contain nicotine or tobacco. These products include cigarettes, chewing tobacco, and vaping devices, such as e-cigarettes. These can delay healing after surgery. If you need help quitting, ask your health care provider. Do not drink alcohol until your health care provider approves. General instructions Do not douche, use tampons, or have sex for at least 6 weeks, or as told by your health care provider. If you struggle with physical or emotional changes after your procedure, speak with your health care provider or a therapist. The stitches inside your vagina will dissolve over time and do not need to be taken out. Do not take baths, swim, or use a hot tub until your health care provider approves. You may only be allowed to take showers for 2-3 weeks Wear compression stockings as told by your health care provider. These stockings help to prevent blood clots and reduce swelling in your legs. Keep all follow-up visits. This is important. Contact a health care provider if: Your pain medicine is not helping. You have a fever. You have nausea or vomiting that does not go away. You feel dizzy. You have blood, pus, or a bad-smelling discharge from your  vagina more than 1 week after the procedure. You continue to have trouble urinating 3-5 days after the procedure. Get help right away if: You have severe pain in your abdomen or back. You faint. You have heavy vaginal bleeding and blood clots, soaking through a sanitary pad in less than 1 hour. You have  chest pain or shortness of breath. You have pain, swelling, or redness in your leg. These symptoms may represent a serious problem that is an emergency. Do not wait to see if the symptoms will go away. Get medical help right away. Call your local emergency services (911 in the [place] Do not drive yourself to the hospital. Summary After the procedure, it is common to have pain, vaginal bleeding, constipation, temporary problems emptying your bladder, and feelings of sadness or other emotions. Take over-the-counter and prescription medicines only as told by your health care provider. Rest as told by your health care provider. Return to your normal activities as told by your health care provider. Contact a health care provider if your pain medicine is not helping, or you have a fever, dizziness, or trouble urinating several days after the procedure. Get help right away if you have severe pain in your abdomen or back, or if you faint, have heavy bleeding, or have chest pain or shortness of breath. This information is not intended to replace advice given to you by your health care provider. Make sure you discuss any questions you have with your healthcare provider. Document Revised: 09/11/2019 Document Reviewed: 09/11/2019 Elsevier Patient Education  2022 [place]. Vaginal Hysterectomy  A vaginal hysterectomy is a procedure to remove all or part of the uterus through a small incision in the vagina. In this procedure, your health care provider may remove your entire uterus, including the cervix. The cervix is the opening and bottom part of the uterus and is located between the vagina and theuterus. Sometimes, the ovaries and fallopian tubes are also removed. This surgery may be done to treat problems such as: Noncancerous growths in the uterus (uterine fibroids) that cause symptoms. A condition that causes the lining of the uterus to grow in other areas (endometriosis). Problems with pelvic  support. Cancer of the cervix, ovaries, uterus, or tissue that lines the uterus (endometrium). Excessive bleeding in the uterus. When removing your uterus, your health care provider may also remove the ovaries and the fallopian tubes. After this procedure, you will no longer beable to have a baby, and you will no longer have a menstrual period. Tell a health care provider about: Any allergies you have. All medicines you are taking, including vitamins, herbs, eye drops, creams, and over-the-counter medicines. Any problems you or family members have had with anesthetic medicines. Any blood disorders you have. Any surgeries you have had. Any medical conditions you have. Whether you are pregnant or may be pregnant. What are the risks? Generally, this is a safe procedure. However, problems may occur, including: Bleeding. Infection. Blood clots in the legs or lungs. Damage to nearby structures or organs. Pain during sex. Allergic reactions to medicines. What happens before the procedure? Staying hydrated Follow instructions from your health care provider about hydration, which may include: Up to 2 hours before the procedure - you may continue to drink clear liquids, such as water, clear fruit juice, black coffee, and plain tea.  Eating and drinking restrictions Follow instructions from your health care provider about eating and drinking, which may include: 8 hours before the procedure - stop eating heavy meals  or foods, such as meat, fried foods, or fatty foods. 6 hours before the procedure - stop eating light meals or foods, such as toast or cereal. 6 hours before the procedure - stop drinking milk or drinks that contain milk. 2 hours before the procedure - stop drinking clear liquids. Medicines Ask your health care provider about: Changing or stopping your regular medicines. This is especially important if you are taking diabetes medicines or blood thinners. Taking medicines such as  aspirin and ibuprofen. These medicines can thin your blood. Do not take these medicines unless your health care provider tells you to take them. Taking over-the-counter medicines, vitamins, herbs, and supplements. You may be asked to take a medicine to empty your colon (bowel preparation). General instructions If you were asked to do a bowel preparation before the procedure, follow instructions from your health care provider. This procedure can affect the way you feel about yourself. Talk with your health care provider about the physical and emotional changes hysterectomy may cause. Do not use any products that contain nicotine or tobacco for at least 4 weeks before the procedure. These products include cigarettes, e-cigarettes, and chewing tobacco. If you need help quitting, ask your health care provider. Plan to have a responsible adult take you home from the hospital or clinic. Plan to have a responsible adult care for you for the time you are told after you leave the hospital or clinic. This is important. Surgery safety Ask your health care provider: How your surgery site will be marked. What steps will be taken to help prevent infection. These may include: Removing hair at the surgery site. Washing skin with a germ-killing soap. Receiving antibiotic medicine. What happens during the procedure? An IV will be inserted into one of your veins. You will be given one or more of the following: A medicine to help you relax (sedative). A medicine to numb the area (local anesthetic). A medicine to make you fall asleep (general anesthetic). A medicine that is injected into your spine to numb the area below and slightly above the injection site (spinal anesthetic). A medicine that is injected into an area of your body to numb everything below the injection site (regional anesthetic). Your surgeon will make an incision in your vagina. Your surgeon will locate and remove all or part of your uterus.  Part or all of the uterus will be removed through the vagina. Your ovaries and fallopian tubes may be removed at the same time. The incision in your vagina will be closed with stitches (sutures) that dissolve over time. The procedure may vary among health care providers and hospitals. What happens after the procedure? Your blood pressure, heart rate, breathing rate, and blood oxygen level will be monitored until you leave the hospital or clinic. You will be encouraged to walk as soon as possible. You will also use a device or do breathing exercises to keep your lungs clear. You may have to wear compression stockings. These stockings help to prevent blood clots and reduce swelling in your legs. You will be given pain medicine as needed. You will need to wear a sanitary pad for vaginal discharge or bleeding. Summary A vaginal hysterectomy is a procedure to remove all or part of the uterus through the vagina. You may need a vaginal hysterectomy to treat a variety of abnormalities of the uterus. Plan to have a responsible adult take you home from the hospital or clinic. Plan to have a responsible adult care for you for the  time you are told after you leave the hospital or clinic. This is important. This information is not intended to replace advice given to you by your health care provider. Make sure you discuss any questions you have with your healthcare provider. Document Revised: 09/11/2019 Document Reviewed: 09/11/2019 Elsevier Patient Education  2022 [place]

## 2020-09-02 ENCOUNTER — Encounter: Payer: Self-pay | Admitting: *Deleted

## 2020-09-02 ENCOUNTER — Telehealth: Payer: Self-pay | Admitting: *Deleted

## 2020-09-02 NOTE — Telephone Encounter (Signed)
Call to patient. Surgery date options available and agreed to 10-11-20. Scheduled for 9-20 at 0930 ( arrive 0730) at [facility].  Surgery instructions reviewed and advised will receive call from pre-op nurse and letter in mail.My Chart.  Encounter closed.

## 2020-09-07 ENCOUNTER — Encounter: Payer: Self-pay | Admitting: Family

## 2020-09-07 ENCOUNTER — Ambulatory Visit (INDEPENDENT_AMBULATORY_CARE_PROVIDER_SITE_OTHER): Payer: Commercial Managed Care - PPO

## 2020-09-07 ENCOUNTER — Ambulatory Visit (INDEPENDENT_AMBULATORY_CARE_PROVIDER_SITE_OTHER): Payer: Commercial Managed Care - PPO | Admitting: Family

## 2020-09-07 ENCOUNTER — Other Ambulatory Visit: Payer: Self-pay

## 2020-09-07 VITALS — BP 144/94 | HR 105 | Temp 98.3°F | Resp 15 | Ht 66.61 in | Wt 239.0 lb

## 2020-09-07 DIAGNOSIS — R21 Rash and other nonspecific skin eruption: Secondary | ICD-10-CM | POA: Diagnosis not present

## 2020-09-07 DIAGNOSIS — R0602 Shortness of breath: Secondary | ICD-10-CM | POA: Diagnosis not present

## 2020-09-07 DIAGNOSIS — R5383 Other fatigue: Secondary | ICD-10-CM

## 2020-09-07 NOTE — Progress Notes (Signed)
Pt presents for fatigue reports she has been out of work for past week due to weakness Hives present in left axilla symptoms include redness and itchy

## 2020-09-07 NOTE — Progress Notes (Signed)
Patient ID: [name], female    DOB: 1970/11/25  MRN: [id number]  CC: Fatigue  Subjective: Kristine Kelly is a 50 y.o. female who presents for fatigue.   FATIGUE: Visit 09/01/2020 at [facility] per MD note: Kristine Kelly presents for f/u for her DUB. U/S revealed uterine fibroid, vol 82 ml H/O BTL and endometrial ablation H/O TSVD x 3 Megace is not helping. Has some degree of vaginal bleeding daily Since last seen she has become very tried and fatigued. Unable to work because of these Sx. Saw Dr Genene Churn who placed her on Zoloft and referred to [facility]. Pt does not think she is depressed She does report several menopausal Sx. CPE in April normal and normal labs.   09/07/2020: Still feeling tired of generalized body. Out of work for the last week. Recently returned to work. Still having frequent intermittent vaginal bleeding, hysterectomy scheduled for September 2022.   Patient denies depression. Does have a history of anxiety however this is not new onset. Denies thoughts of self-harm, suicidal ideations, and homicidal ideations.   Does believe that she snores while sleeping.   Endorses intermittent shortness of breath and loss of appetite. Using inhaler which helps some. Recalls this is similar to how she felt when she had Covid in 2020.  2. RASH: Present near left armpit and left chest. Also, developing on right chest. Denies changing detergents, soaps, and perfumes. Endorses itching.   Patient Active Problem List   Diagnosis Date Noted   Menopausal symptoms 09/01/2020   DUB (dysfunctional uterine bleeding) 08/10/2020   Dermatophytosis 05/25/2020   Obesity 04/28/2018   Anxiety and depression 10/04/2016     Current Outpatient Medications on File Prior to Visit  Medication Sig Dispense Refill   amLODipine (NORVASC) 5 MG tablet Take 1 tablet (5 mg total) by mouth daily. (Patient not taking: No sig reported) 30 tablet 1    estrogen, conjugated,-medroxyprogesterone (PREMPRO) 0.625-5 MG tablet Take 1 tablet by mouth daily. 30 tablet 5   No current facility-administered medications on file prior to visit.    Allergies  Allergen Reactions   Aspirin Anaphylaxis   Atenolol Anaphylaxis    Social History   Socioeconomic History   Marital status: Single    Spouse name: Not on file   Number of children: Not on file   Years of education: Not on file   Highest education level: Not on file  Occupational History   Not on file  Tobacco Use   Smoking status: Never   Smokeless tobacco: Never  Vaping Use   Vaping Use: Never used  Substance and Sexual Activity   Alcohol use: No   Drug use: No   Sexual activity: Yes    Birth control/protection: Surgical  Other Topics Concern   Not on file  Social History Narrative   Not on file   Social Determinants of Health   Financial Resource Strain: Not on file  Food Insecurity: Not on file  Transportation Needs: Not on file  Physical Activity: Not on file  Stress: Not on file  Social Connections: Not on file  Intimate Partner Violence: Not on file    Family History  Problem Relation Age of Onset   Asthma Mother     Past Surgical History:  Procedure Laterality Date   BREAST LUMPECTOMY     DILATATION AND CURETTAGE/HYSTEROSCOPY WITH MINERVA N/A 07/30/2018   Procedure: DILATATION AND CURETTAGE/HYSTEROSCOPY WITH MINERVA;  Surgeon: Clovia Cuff  C, MD;  Location: [place];  Service: Gynecology;  Laterality: N/A;   TUBAL LIGATION      ROS: Review of Systems Negative except as stated above  PHYSICAL EXAM: BP (!) 144/94 (BP Location: Left Arm, Patient Position: Sitting, Cuff Size: Large)   Pulse (!) 105   Temp 98.3 F (36.8 C)   Resp 15   Ht 5' 6.61" (1.692 m)   Wt 239 lb (108.4 kg)   SpO2 97%   BMI 37.87 kg/m   Physical Exam HENT:     Head: Normocephalic and atraumatic.  Eyes:     Extraocular Movements: Extraocular movements intact.      Conjunctiva/sclera: Conjunctivae normal.     Pupils: Pupils are equal, round, and reactive to light.  Cardiovascular:     Rate and Rhythm: Tachycardia present.  Pulmonary:     Effort: Pulmonary effort is normal.     Breath sounds: Wheezing present.     Comments: Bilateral upper lung fields with wheezing. Musculoskeletal:     Cervical back: Normal range of motion and neck supple.  Skin:    Findings: Rash present. Rash is papular.     Comments: Three spots located near left axilla and bilateral chest. No evidence of drainage.   Neurological:     Mental Status: She is alert.    ASSESSMENT AND PLAN: 1. Fatigue, unspecified type: - Possibly related to dysfunctional uterine bleeding. Patient scheduled for hysterectomy 10/11/2020. Keep all appointments with Gynecology as scheduled.  - Patient denies depression. Does have a history of anxiety however this is not new onset. Denies thoughts of self-harm, suicidal ideations, and homicidal ideations.  - Differentials include chronic fatigue syndrome, sleep apnea, and long Covid. - Patient provided with work note as we await referral to Pulmonology (see #2) and hysterectomy which is already scheduled.  2. Shortness of breath: - Patient present today in office without symptoms of cardiopulmonary distress.  - Patient with history of Covid 2020. Patient has concerns of possible long Covid. - Diagnostic chest x-ray for further evaluation.  - Patient will return for rapid Covid test in office.  - Referral to Pulmonology for further evaluation and management.  - Patient was given clear instructions to go to Emergency Department or return to medical center if symptoms don't improve, worsen, or new problems develop.The patient verbalized understanding. - Follow-up with primary provider as scheduled. - DG Chest 2 View; Future - Ambulatory referral to Pulmonology  3. Rash and nonspecific skin eruption: - Try course of over-the-counter  Diphenhydramine or Cetirizine.  - Follow-up with primary provider as scheduled.     Patient was given the opportunity to ask questions.  Patient verbalized understanding of the plan and was able to repeat key elements of the plan. Patient was given clear instructions to go to Emergency Department or return to medical center if symptoms don't improve, worsen, or new problems develop.The patient verbalized understanding.   Orders Placed This Encounter  Procedures   DG Chest 2 View   Follow-up with primary provider as scheduled.   Camillia Herter, NP

## 2020-09-08 NOTE — Progress Notes (Signed)
Heart size normal. Lungs clear.

## 2020-09-23 ENCOUNTER — Ambulatory Visit
Admission: RE | Admit: 2020-09-23 | Discharge: 2020-09-23 | Disposition: A | Discharge status: Home / Self Care | Payer: Commercial Managed Care - PPO | Source: Ambulatory Visit | Attending: Family | Admitting: Family

## 2020-09-23 ENCOUNTER — Other Ambulatory Visit: Payer: Self-pay

## 2020-09-23 DIAGNOSIS — Z1231 Encounter for screening mammogram for malignant neoplasm of breast: Secondary | ICD-10-CM

## 2020-09-27 NOTE — Progress Notes (Signed)
[facility] should contact patient soon to schedule follow-up mammogram and ultrasound of bilateral breasts. Patient encouraged to contact their office if has not heard from them soon. Phone #: [phone]

## 2020-09-27 NOTE — Progress Notes (Signed)
Right breast appearing asymmetrical. Left breast appearing with possible masses. [facility] should contact patient soon to schedule follow-up mammogram and ultrasound of bilateral breasts. Patient encouraged to contact their office if has not heard from them soon. Phone #: [phone]

## 2020-09-28 ENCOUNTER — Other Ambulatory Visit: Payer: Self-pay | Admitting: Family

## 2020-09-28 DIAGNOSIS — R928 Other abnormal and inconclusive findings on diagnostic imaging of breast: Secondary | ICD-10-CM

## 2020-10-04 ENCOUNTER — Other Ambulatory Visit: Payer: Self-pay

## 2020-10-04 ENCOUNTER — Encounter (HOSPITAL_BASED_OUTPATIENT_CLINIC_OR_DEPARTMENT_OTHER): Payer: Self-pay | Admitting: Obstetrics and Gynecology

## 2020-10-04 DIAGNOSIS — F40240 Claustrophobia: Secondary | ICD-10-CM

## 2020-10-04 DIAGNOSIS — Z973 Presence of spectacles and contact lenses: Secondary | ICD-10-CM

## 2020-10-04 DIAGNOSIS — Z01818 Encounter for other preprocedural examination: Secondary | ICD-10-CM | POA: Diagnosis not present

## 2020-10-04 HISTORY — DX: Claustrophobia: F40.240

## 2020-10-04 HISTORY — DX: Presence of spectacles and contact lenses: Z97.3

## 2020-10-04 NOTE — Progress Notes (Signed)
YOU ARE SCHEDULED FOR A COVID TEST ON  10-07-2020   . THIS TEST MUST BE DONE BEFORE SURGERY. GO TO  [facility] @ [place]   PHONE NUMBER [phone].   TURN LEFT AT THE SHIPPING AND RECEIVING SIGN AND LOOK FOR SMALL BLUE POP UP TENT UNDER BUILDING OVERHANG AT BACK OF BUILDING AND REMAIN IN YOUR CAR, THIS IS A DRIVE UP TEST.  AFTER YOUR COVID TEST , PLEASE WEAR A MASK OUT IN PUBLIC AND SOCIAL DISTANCE AND F[place] YOUR HANDS FREQUENTLY. PLEASE ASK ALL YOUR CLOSE HOUSEHOLD CONTACT TO WEAR MASK OUT IN PUBLIC AND SOCIAL DISTANCE AND Orin HANDS FREQUENTLY ALSO.      Your procedure is scheduled on 10-11-2020  Report to [facility].   Call this number if you have problems the morning of surgery  :[phone].   OUR ADDRESS IS [place].  WE ARE LOCATED IN [facility].  PLEASE BRING YOUR INSURANCE CARD AND PHOTO ID DAY OF SURGERY.  ONLY ONE PERSON ALLOWED IN FACILITY WAITING AREA.                                     REMEMBER:  DO NOT EAT FOOD, CANDY GUM OR MINTS  AFTER MIDNIGHT . YOU MAY HAVE CLEAR LIQUIDS FROM MIDNIGHT UNTIL 900 AM. NO CLEAR LIQUIDS AFTER  900 AM DAY OF SURGERY.   YOU MAY  BRUSH YOUR TEETH MORNING OF SURGERY AND RINSE YOUR MOUTH OUT, NO CHEWING GUM CANDY OR MINTS.    CLEAR LIQUID DIET   Foods Allowed                                                                     Foods Excluded  Coffee and tea, regular and decaf                             liquids that you cannot  Plain Jell-O any favor except red or purple                                           see through such as: Fruit ices (not with fruit pulp)                                     milk, soups, orange juice  Iced Popsicles                                    All solid food Carbonated beverages, regular and diet                                    Cranberry, grape and apple juices Sports drinks like Gatorade Lightly  seasoned clear  broth or consume(fat free) Sugar  Sample Menu Breakfast                                Lunch                                     Supper Cranberry juice                    Beef broth                            Chicken broth Jell-O                                     Grape juice                           Apple juice Coffee or tea                        Jell-O                                      Popsicle                                                Coffee or tea                        Coffee or tea  _____________________________________________________________________     TAKE THESE MEDICATIONS MORNING OF SURGERY WITH A SIP OF WATER:  TAKE YOUR PRILOSEC IF YOU HAVE ANY AT HOME TO HELP REDUCE STOMACH ACID AFTER SURGERY.  ONE VISITOR IS ALLOWED IN WAITING ROOM ONLY DAY OF SURGERY.  NO VISITOR MAY SPEND THE NIGHT.  VISITOR ARE ALLOWED TO STAY UNTIL 800 PM.                                    DO NOT WEAR JEWERLY, MAKE UP. DO NOT WEAR LOTIONS, POWDERS, PERFUMES OR NAIL POLISH. DO NOT SHAVE FOR 48 HOURS PRIOR TO DAY OF SURGERY. MEN MAY SHAVE FACE AND NECK. CONTACTS, GLASSES, OR DENTURES MAY NOT BE WORN TO SURGERY.                                    [facility] IS NOT RESPONSIBLE  FOR ANY BELONGINGS.                                                                    Marland Kitchen           [facility] - Preparing for Surgery Before surgery, you can play an important role.  Because skin is not sterile, your skin needs to be as free of germs as possible.  You can reduce the number of germs on your skin by washing with CHG (chlorahexidine gluconate) soap before surgery.  CHG is an antiseptic cleaner which kills germs and bonds with the skin to continue killing germs even after washing. Please DO NOT use if you have an allergy to CHG or antibacterial soaps.  If your skin becomes reddened/irritated stop using the CHG and inform your nurse when you arrive at Short Stay. Do not shave (including legs  and underarms) for at least 48 hours prior to the first CHG shower.  You may shave your face/neck. Please follow these instructions carefully:  1.  Shower with CHG Soap the night before surgery and the  morning of Surgery.  2.  If you choose to wash your hair, wash your hair first as usual with your  normal  shampoo.  3.  After you shampoo, rinse your hair and body thoroughly to remove the  shampoo.                            4.  Use CHG as you would any other liquid soap.  You can apply chg directly  to the skin and wash                      Gently with a scrungie or clean washcloth.  5.  Apply the CHG Soap to your body ONLY FROM THE NECK DOWN.   Do not use on face/ open                           Wound or open sores. Avoid contact with eyes, ears mouth and genitals (private parts).                       Wash face,  Genitals (private parts) with your normal soap.             6.  Wash thoroughly, paying special attention to the area where your surgery  will be performed.  7.  Thoroughly rinse your body with warm water from the neck down.  8.  DO NOT shower/wash with your normal soap after using and rinsing off  the CHG Soap.                9.  Pat yourself dry with a clean towel.            10.  Wear clean pajamas.            11.  Place clean sheets on your bed the night of your first shower and do not  sleep with pets. Day of Surgery : Do not apply any lotions/deodorants the morning of surgery.  Please wear clean clothes to the hospital/surgery center.  FAILURE TO FOLLOW THESE INSTRUCTIONS MAY RESULT IN THE CANCELLATION OF YOUR SURGERY PATIENT SIGNATURE_________________________________  NURSE SIGNATURE__________________________________  ________________________________________________________________________                                                        QUESTIONS Hansel Feinstein PRE  OP NURSE PHONE [phone].

## 2020-10-04 NOTE — Progress Notes (Signed)
Spoke w/ via phone for pre-op interview---PT Lab needs dos----  urine preg             Lab results------has lab appt 10-07-2020 for cbc bmp t & s and ekg (ekg 03-26-2020 epic abnormal will repeat) COVID test -----10-07-2020 overnight stay Arrive at -------1000 am 10-11-2020 NPO after MN NO Solid Food.  Clear liquids from MN until---900 am Med rec completed Medications to take morning of surgery -----prilosec Diabetic medication -----n/a Patient instructed no nail polish to be worn day of surgery Patient instructed to bring photo id and insurance card day of surgery Patient aware to have Driver (ride ) / caregiver   daughtermartha Kelly  for 24 hours after surgery  Patient Special Instructions -----pt given overnight stay instructions Pre-Op special Istructions -----none Patient verbalized understanding of instructions that were given at this phone interview. Patient denies shortness of breath, chest pain, fever, cough at this phone interview.

## 2020-10-05 ENCOUNTER — Other Ambulatory Visit (HOSPITAL_COMMUNITY)
Admission: RE | Admit: 2020-10-05 | Discharge: 2020-10-05 | Disposition: A | Discharge status: Home / Self Care | Payer: Commercial Managed Care - PPO | Source: Ambulatory Visit | Attending: Nurse Practitioner | Admitting: Nurse Practitioner

## 2020-10-05 ENCOUNTER — Ambulatory Visit: Payer: Commercial Managed Care - PPO | Admitting: Nurse Practitioner

## 2020-10-05 ENCOUNTER — Other Ambulatory Visit: Payer: Self-pay

## 2020-10-05 ENCOUNTER — Encounter: Payer: Self-pay | Admitting: Nurse Practitioner

## 2020-10-05 VITALS — BP 119/78 | HR 83 | Temp 97.9°F | Resp 20 | Wt 239.2 lb

## 2020-10-05 DIAGNOSIS — N898 Other specified noninflammatory disorders of vagina: Secondary | ICD-10-CM | POA: Insufficient documentation

## 2020-10-05 MED ORDER — FLUCONAZOLE 150 MG PO TABS: 150.0000 mg | ORAL_TABLET | Freq: Once | ORAL | 0 refills | Status: AC

## 2020-10-05 NOTE — Patient Instructions (Signed)
Vaginal irritation:  Swab sent today  Will order diflucan   Vaginal Dryness:  May try Replens moisturizer if needed    Follow up:  Follow up with OB/GYN as scheduled

## 2020-10-05 NOTE — Assessment & Plan Note (Signed)
Vaginal irritation:  Swab sent today  Will order diflucan   Vaginal Dryness:  May try Replens moisturizer if needed    Follow up:  Follow up with OB/GYN as scheduled

## 2020-10-05 NOTE — Progress Notes (Signed)
$'@Patient'j$  ID: Kristine Kelly, female    DOB: 01-28-1970, 50 y.o.   MRN: [id number]  Chief Complaint  Patient presents with   Vaginal Itching    Referring provider: Camillia Herter, NP  HPI  50 y.o. female complains of cloudy vaginal discharge for 2 day(s). Denies abnormal vaginal bleeding or significant pelvic pain or fever. No UTI symptoms. Denies history of known exposure to STD.  Note: Patient has hysterectomy scheduled for next week     Allergies  Allergen Reactions   Aspirin Other (See Comments)    HEADACHE, DIZZINESS   Atenolol Other (See Comments)    HEADACHES DIZZINESS    Immunization History  Administered Date(s) Administered   Tdap 05/30/2020    Past Medical History:  Diagnosis Date   Anxiety    NO RECENT ANXIETY ATTACKS PER PT ON 10-04-2020   Arthritis    OA LEFT KNEE   Claustrophobia 10/04/2020   COVID-19 2020   COUGH SOB X 6 WEEKS ALL SYMPTOMS RESOLVED   GERD (gastroesophageal reflux disease)    Headache    Hypertension    not currently on medications   Wears glasses 10/04/2020    Tobacco History: Social History   Tobacco Use  Smoking Status Never  Smokeless Tobacco Never   Counseling given: Yes   Outpatient Encounter Medications as of 10/05/2020  Medication Sig   estrogen, conjugated,-medroxyprogesterone (PREMPRO) 0.625-5 MG tablet Take 1 tablet by mouth daily. (Patient taking differently: Take 1 tablet by mouth every evening.)   fluconazole (DIFLUCAN) 150 MG tablet Take 1 tablet (150 mg total) by mouth once for 1 dose.   omeprazole (PRILOSEC) 20 MG capsule Take 20 mg by mouth as needed.   Multiple Vitamins-Minerals (MULTIVITAMIN WITH MINERALS) tablet Take 1 tablet by mouth daily.   [DISCONTINUED] amLODipine (NORVASC) 5 MG tablet Take 1 tablet (5 mg total) by mouth daily. (Patient not taking: No sig reported)   No facility-administered encounter medications on file as of 10/05/2020.     Review of Systems  Review of Systems   Constitutional: Negative.   HENT: Negative.    Cardiovascular: Negative.   Gastrointestinal: Negative.   Genitourinary:  Positive for vaginal discharge and vaginal pain. Negative for vaginal bleeding.  Allergic/Immunologic: Negative.   Neurological: Negative.   Psychiatric/Behavioral: Negative.        Physical Exam  BP 119/78 (BP Location: Left Arm, Patient Position: Sitting, Cuff Size: Large)   Pulse 83   Temp 97.9 F (36.6 C)   Resp 20   Wt 239 lb 3.2 oz (108.5 kg)   SpO2 98%   BMI 37.46 kg/m   Wt Readings from Last 5 Encounters:  10/05/20 239 lb 3.2 oz (108.5 kg)  09/07/20 239 lb (108.4 kg)  09/01/20 242 lb 8 oz (110 kg)  08/22/20 254 lb (115.2 kg)  08/10/20 254 lb (115.2 kg)     Physical Exam Vitals and nursing note reviewed.  Constitutional:      General: She is not in acute distress.    Appearance: She is well-developed.  Cardiovascular:     Rate and Rhythm: Normal rate and regular rhythm.  Pulmonary:     Effort: Pulmonary effort is normal.     Breath sounds: Normal breath sounds.  Neurological:     Mental Status: She is alert and oriented to person, place, and time.      Assessment & Plan:   Vaginal discharge Vaginal irritation:  Swab sent today  Will order diflucan   Vaginal  Dryness:  May try Replens moisturizer if needed    Follow up:  Follow up with OB/GYN as scheduled     Fenton Foy, NP 10/05/2020

## 2020-10-05 NOTE — Progress Notes (Signed)
Pt presents for vaginal dryness, pt reports symptoms of itching, and burning in vaginal area

## 2020-10-06 LAB — CERVICOVAGINAL ANCILLARY ONLY

## 2020-10-07 ENCOUNTER — Encounter (HOSPITAL_COMMUNITY)
Admission: RE | Admit: 2020-10-07 | Discharge: 2020-10-07 | Disposition: A | Discharge status: Home / Self Care | Payer: Commercial Managed Care - PPO | Source: Ambulatory Visit | Attending: Obstetrics and Gynecology | Admitting: Obstetrics and Gynecology

## 2020-10-07 ENCOUNTER — Other Ambulatory Visit: Payer: Self-pay

## 2020-10-07 ENCOUNTER — Other Ambulatory Visit: Payer: Self-pay | Admitting: Obstetrics and Gynecology

## 2020-10-07 DIAGNOSIS — Z01818 Encounter for other preprocedural examination: Secondary | ICD-10-CM | POA: Insufficient documentation

## 2020-10-07 LAB — CBC
HCT: 40 % (ref 36.0–46.0)
Hemoglobin: 13.1 g/dL (ref 12.0–15.0)
MCH: 29.4 pg (ref 26.0–34.0)
MCHC: 32.8 g/dL (ref 30.0–36.0)
MCV: 89.7 fL (ref 80.0–100.0)
Platelets: 218 10*3/uL (ref 150–400)
RBC: 4.46 MIL/uL (ref 3.87–5.11)
RDW: 13.4 % (ref 11.5–15.5)
WBC: 7.6 10*3/uL (ref 4.0–10.5)
nRBC: 0 % (ref 0.0–0.2)

## 2020-10-07 LAB — BASIC METABOLIC PANEL
Anion gap: 6 (ref 5–15)
BUN: 12 mg/dL (ref 6–20)
CO2: 23 mmol/L (ref 22–32)
Calcium: 9.4 mg/dL (ref 8.9–10.3)
Chloride: 111 mmol/L (ref 98–111)
Creatinine, Ser: 0.74 mg/dL (ref 0.44–1.00)
GFR, Estimated: >60 mL/min (ref >60)
Glucose, Bld: 77 mg/dL (ref 70–99)
Potassium: 4.1 mmol/L (ref 3.5–5.1)
Sodium: 140 mmol/L (ref 135–145)

## 2020-10-08 LAB — SARS CORONAVIRUS 2 (TAT 6-24 HRS): SARS Coronavirus 2: NEGATIVE

## 2020-10-11 ENCOUNTER — Ambulatory Visit (HOSPITAL_BASED_OUTPATIENT_CLINIC_OR_DEPARTMENT_OTHER): Payer: Commercial Managed Care - PPO | Admitting: Certified Registered Nurse Anesthetist

## 2020-10-11 ENCOUNTER — Encounter (HOSPITAL_BASED_OUTPATIENT_CLINIC_OR_DEPARTMENT_OTHER)
Admission: RE | Disposition: A | Discharge status: Home / Self Care | Payer: Self-pay | Source: Home / Self Care | Attending: Obstetrics and Gynecology

## 2020-10-11 ENCOUNTER — Encounter (HOSPITAL_BASED_OUTPATIENT_CLINIC_OR_DEPARTMENT_OTHER): Payer: Self-pay | Admitting: Obstetrics and Gynecology

## 2020-10-11 ENCOUNTER — Observation Stay (HOSPITAL_BASED_OUTPATIENT_CLINIC_OR_DEPARTMENT_OTHER)
Admission: RE | Admit: 2020-10-11 | Discharge: 2020-10-12 | Disposition: A | Discharge status: Home / Self Care | Payer: Commercial Managed Care - PPO | Attending: Obstetrics and Gynecology | Admitting: Obstetrics and Gynecology

## 2020-10-11 DIAGNOSIS — N938 Other specified abnormal uterine and vaginal bleeding: Secondary | ICD-10-CM | POA: Diagnosis present

## 2020-10-11 DIAGNOSIS — Z8616 Personal history of COVID-19: Secondary | ICD-10-CM | POA: Insufficient documentation

## 2020-10-11 DIAGNOSIS — D259 Leiomyoma of uterus, unspecified: Secondary | ICD-10-CM | POA: Diagnosis not present

## 2020-10-11 DIAGNOSIS — I10 Essential (primary) hypertension: Secondary | ICD-10-CM | POA: Diagnosis not present

## 2020-10-11 DIAGNOSIS — N800 Endometriosis of uterus: Secondary | ICD-10-CM | POA: Insufficient documentation

## 2020-10-11 DIAGNOSIS — N840 Polyp of corpus uteri: Secondary | ICD-10-CM | POA: Insufficient documentation

## 2020-10-11 HISTORY — DX: Anxiety disorder, unspecified: F41.9

## 2020-10-11 HISTORY — DX: Unspecified osteoarthritis, unspecified site: M19.90

## 2020-10-11 HISTORY — DX: Headache, unspecified: R51.9

## 2020-10-11 HISTORY — PX: VAGINAL HYSTERECTOMY: SHX2639

## 2020-10-11 LAB — BASIC METABOLIC PANEL
Anion gap: 7 (ref 5–15)
BUN: 8 mg/dL (ref 6–20)
CO2: 21 mmol/L — ABNORMAL LOW (ref 22–32)
Calcium: 8.7 mg/dL — ABNORMAL LOW (ref 8.9–10.3)
Chloride: 106 mmol/L (ref 98–111)
Creatinine, Ser: 0.68 mg/dL (ref 0.44–1.00)
GFR, Estimated: >60 mL/min (ref >60)
Glucose, Bld: 135 mg/dL — ABNORMAL HIGH (ref 70–99)
Potassium: 4 mmol/L (ref 3.5–5.1)
Sodium: 134 mmol/L — ABNORMAL LOW (ref 135–145)

## 2020-10-11 LAB — CBC
HCT: 35.3 % — ABNORMAL LOW (ref 36.0–46.0)
Hemoglobin: 11.9 g/dL — ABNORMAL LOW (ref 12.0–15.0)
MCH: 29.5 pg (ref 26.0–34.0)
MCHC: 33.7 g/dL (ref 30.0–36.0)
MCV: 87.6 fL (ref 80.0–100.0)
Platelets: 198 10*3/uL (ref 150–400)
RBC: 4.03 MIL/uL (ref 3.87–5.11)
RDW: 13.2 % (ref 11.5–15.5)
WBC: 11.6 10*3/uL — ABNORMAL HIGH (ref 4.0–10.5)
nRBC: 0 % (ref 0.0–0.2)

## 2020-10-11 LAB — TYPE AND SCREEN
ABO/RH(D): B POS
Antibody Screen: NEGATIVE

## 2020-10-11 LAB — POCT PREGNANCY, URINE: Preg Test, Ur: NEGATIVE

## 2020-10-11 LAB — ABO/RH: ABO/RH(D): B POS

## 2020-10-11 SURGERY — HYSTERECTOMY, VAGINAL
Anesthesia: General | Site: Vagina

## 2020-10-11 MED ORDER — SODIUM CHLORIDE 0.9 % IV SOLN
2.0000 g | INTRAVENOUS | Status: AC
  Administered 2020-10-11: 2 g via INTRAVENOUS

## 2020-10-11 MED ORDER — PROPOFOL 10 MG/ML IV BOLUS
INTRAVENOUS | Status: DC | PRN
Start: 2020-10-11 — End: 2020-10-11
  Administered 2020-10-11: 120 mg via INTRAVENOUS

## 2020-10-11 MED ORDER — KETOROLAC TROMETHAMINE 30 MG/ML IJ SOLN
INTRAMUSCULAR | Status: AC
  Filled 2020-10-11: qty 1

## 2020-10-11 MED ORDER — KETOROLAC TROMETHAMINE 30 MG/ML IJ SOLN
30.0000 mg | Freq: Four times a day (QID) | INTRAMUSCULAR | Status: DC
Start: 2020-10-11 — End: 2020-10-12
  Administered 2020-10-11 – 2020-10-12 (×3): 30 mg via INTRAVENOUS

## 2020-10-11 MED ORDER — ACETAMINOPHEN 500 MG PO TABS
1000.0000 mg | ORAL_TABLET | ORAL | Status: AC
  Administered 2020-10-11: 1000 mg via ORAL

## 2020-10-11 MED ORDER — LORAZEPAM 2 MG/ML IJ SOLN
1.0000 mg | Freq: Once | INTRAMUSCULAR | Status: DC
  Filled 2020-10-11: qty 0.5

## 2020-10-11 MED ORDER — ONDANSETRON HCL 4 MG PO TABS: 4.0000 mg | ORAL_TABLET | Freq: Four times a day (QID) | ORAL | Status: DC | PRN

## 2020-10-11 MED ORDER — EPHEDRINE 5 MG/ML INJ
INTRAVENOUS | Status: AC
  Filled 2020-10-11: qty 5

## 2020-10-11 MED ORDER — DEXAMETHASONE SODIUM PHOSPHATE 4 MG/ML IJ SOLN
INTRAMUSCULAR | Status: DC | PRN
  Administered 2020-10-11: 10 mg via INTRAVENOUS

## 2020-10-11 MED ORDER — ROCURONIUM BROMIDE 100 MG/10ML IV SOLN
INTRAVENOUS | Status: DC | PRN
  Administered 2020-10-11: 10 mg via INTRAVENOUS
  Administered 2020-10-11: 60 mg via INTRAVENOUS

## 2020-10-11 MED ORDER — HYDROMORPHONE HCL 1 MG/ML IJ SOLN
1.0000 mg | INTRAMUSCULAR | Status: DC | PRN
  Administered 2020-10-11 (×2): 0.5 mg via INTRAVENOUS

## 2020-10-11 MED ORDER — OXYCODONE-ACETAMINOPHEN 5-325 MG PO TABS: 2.0000 | ORAL_TABLET | ORAL | Status: DC | PRN

## 2020-10-11 MED ORDER — ZOLPIDEM TARTRATE 5 MG PO TABS: 5.0000 mg | ORAL_TABLET | Freq: Every evening | ORAL | Status: DC | PRN

## 2020-10-11 MED ORDER — HYDROMORPHONE HCL 1 MG/ML IJ SOLN
INTRAMUSCULAR | Status: AC
  Filled 2020-10-11: qty 1

## 2020-10-11 MED ORDER — MIDAZOLAM HCL 2 MG/2ML IJ SOLN
0.5000 mg | Freq: Once | INTRAMUSCULAR | Status: DC | PRN
Start: 2020-10-11 — End: 2020-10-12

## 2020-10-11 MED ORDER — KETOROLAC TROMETHAMINE 15 MG/ML IJ SOLN: 15.0000 mg | INTRAMUSCULAR | Status: AC

## 2020-10-11 MED ORDER — SENNA 8.6 MG PO TABS
ORAL_TABLET | ORAL | Status: AC
  Filled 2020-10-11: qty 1

## 2020-10-11 MED ORDER — LACTATED RINGERS IV SOLN: INTRAVENOUS | Status: DC

## 2020-10-11 MED ORDER — FENTANYL CITRATE (PF) 250 MCG/5ML IJ SOLN
INTRAMUSCULAR | Status: AC
  Filled 2020-10-11: qty 5

## 2020-10-11 MED ORDER — SOD CITRATE-CITRIC ACID 500-334 MG/5ML PO SOLN: 30.0000 mL | ORAL | Status: AC

## 2020-10-11 MED ORDER — LIDOCAINE HCL (PF) 2 % IJ SOLN
INTRAMUSCULAR | Status: AC
  Filled 2020-10-11: qty 5

## 2020-10-11 MED ORDER — DEXAMETHASONE SODIUM PHOSPHATE 10 MG/ML IJ SOLN
INTRAMUSCULAR | Status: AC
  Filled 2020-10-11: qty 1

## 2020-10-11 MED ORDER — MIDAZOLAM HCL 5 MG/5ML IJ SOLN
INTRAMUSCULAR | Status: DC | PRN
  Administered 2020-10-11: 2 mg via INTRAVENOUS

## 2020-10-11 MED ORDER — OXYCODONE HCL 5 MG/5ML PO SOLN: 5.0000 mg | Freq: Once | ORAL | Status: DC | PRN

## 2020-10-11 MED ORDER — SIMETHICONE 80 MG PO CHEW: 80.0000 mg | CHEWABLE_TABLET | Freq: Four times a day (QID) | ORAL | Status: DC | PRN

## 2020-10-11 MED ORDER — KETOROLAC TROMETHAMINE 30 MG/ML IJ SOLN
INTRAMUSCULAR | Status: DC | PRN
  Administered 2020-10-11: 30 mg via INTRAVENOUS

## 2020-10-11 MED ORDER — SUGAMMADEX SODIUM 200 MG/2ML IV SOLN
INTRAVENOUS | Status: DC | PRN
  Administered 2020-10-11: 230 mg via INTRAVENOUS

## 2020-10-11 MED ORDER — SCOPOLAMINE 1 MG/3DAYS TD PT72: 1.0000 | MEDICATED_PATCH | TRANSDERMAL | Status: DC

## 2020-10-11 MED ORDER — MEPERIDINE HCL 25 MG/ML IJ SOLN
6.2500 mg | INTRAMUSCULAR | Status: DC | PRN
Start: 2020-10-11 — End: 2020-10-12

## 2020-10-11 MED ORDER — 0.9 % SODIUM CHLORIDE (POUR BTL) OPTIME
TOPICAL | Status: DC | PRN
  Administered 2020-10-11: 500 mL

## 2020-10-11 MED ORDER — ONDANSETRON HCL 4 MG/2ML IJ SOLN
INTRAMUSCULAR | Status: DC | PRN
  Administered 2020-10-11: 4 mg via INTRAVENOUS

## 2020-10-11 MED ORDER — HYDROMORPHONE HCL 1 MG/ML IJ SOLN
0.2500 mg | INTRAMUSCULAR | Status: DC | PRN
  Administered 2020-10-11 (×4): 0.25 mg via INTRAVENOUS

## 2020-10-11 MED ORDER — SODIUM CHLORIDE 0.9 % IV SOLN
INTRAVENOUS | Status: AC
  Filled 2020-10-11: qty 2

## 2020-10-11 MED ORDER — LIDOCAINE HCL (CARDIAC) PF 100 MG/5ML IV SOSY
PREFILLED_SYRINGE | INTRAVENOUS | Status: DC | PRN
  Administered 2020-10-11: 40 mg via INTRAVENOUS

## 2020-10-11 MED ORDER — IBUPROFEN 800 MG PO TABS: 800.0000 mg | ORAL_TABLET | Freq: Three times a day (TID) | ORAL | Status: DC

## 2020-10-11 MED ORDER — OXYCODONE HCL 5 MG PO TABS: 5.0000 mg | ORAL_TABLET | Freq: Once | ORAL | Status: DC | PRN

## 2020-10-11 MED ORDER — POVIDONE-IODINE 10 % EX SWAB: 2.0000 "application " | Freq: Once | CUTANEOUS | Status: DC

## 2020-10-11 MED ORDER — SENNA 8.6 MG PO TABS
1.0000 | ORAL_TABLET | Freq: Two times a day (BID) | ORAL | Status: DC
  Administered 2020-10-11: 8.6 mg via ORAL

## 2020-10-11 MED ORDER — ONDANSETRON HCL 4 MG/2ML IJ SOLN
INTRAMUSCULAR | Status: AC
  Filled 2020-10-11: qty 2

## 2020-10-11 MED ORDER — OXYCODONE-ACETAMINOPHEN 5-325 MG PO TABS: 1.0000 | ORAL_TABLET | ORAL | Status: DC | PRN

## 2020-10-11 MED ORDER — PROMETHAZINE HCL 25 MG/ML IJ SOLN: 6.2500 mg | INTRAMUSCULAR | Status: DC | PRN

## 2020-10-11 MED ORDER — ACETAMINOPHEN 500 MG PO TABS
ORAL_TABLET | ORAL | Status: AC
  Filled 2020-10-11: qty 2

## 2020-10-11 MED ORDER — FENTANYL CITRATE (PF) 100 MCG/2ML IJ SOLN
INTRAMUSCULAR | Status: DC | PRN
  Administered 2020-10-11 (×3): 50 ug via INTRAVENOUS
  Administered 2020-10-11: 100 ug via INTRAVENOUS

## 2020-10-11 MED ORDER — MIDAZOLAM HCL 2 MG/2ML IJ SOLN
INTRAMUSCULAR | Status: AC
  Filled 2020-10-11: qty 2

## 2020-10-11 MED ORDER — SODIUM CHLORIDE 0.9 % IV BOLUS
500.0000 mL | Freq: Once | INTRAVENOUS | Status: AC
  Administered 2020-10-11: 500 mL via INTRAVENOUS

## 2020-10-11 MED ORDER — EPHEDRINE SULFATE 50 MG/ML IJ SOLN
INTRAMUSCULAR | Status: DC | PRN
  Administered 2020-10-11: 10 mg via INTRAVENOUS

## 2020-10-11 MED ORDER — HYDROXYZINE HCL 25 MG PO TABS
25.0000 mg | ORAL_TABLET | Freq: Four times a day (QID) | ORAL | Status: DC | PRN
  Administered 2020-10-11 (×2): 25 mg via ORAL
  Filled 2020-10-11 (×2): qty 1

## 2020-10-11 MED ORDER — ROCURONIUM BROMIDE 10 MG/ML (PF) SYRINGE
PREFILLED_SYRINGE | INTRAVENOUS | Status: AC
  Filled 2020-10-11: qty 10

## 2020-10-11 MED ORDER — ONDANSETRON HCL 4 MG/2ML IJ SOLN: 4.0000 mg | Freq: Four times a day (QID) | INTRAMUSCULAR | Status: DC | PRN

## 2020-10-11 MED ORDER — PROPOFOL 10 MG/ML IV BOLUS
INTRAVENOUS | Status: AC
  Filled 2020-10-11: qty 40

## 2020-10-11 SURGICAL SUPPLY — 24 items
BLADE SURG 10 STRL SS (BLADE) ×2 IMPLANT
GAUZE 4X4 16PLY ~~LOC~~+RFID DBL (SPONGE) ×4 IMPLANT
GLOVE SURG ENC MOIS LTX SZ6.5 (GLOVE) ×1 IMPLANT
GLOVE SURG ENC MOIS LTX SZ7.5 (GLOVE) ×2 IMPLANT
GLOVE SURG ENC MOIS LTX SZ8 (GLOVE) ×2 IMPLANT
GLOVE SURG POLYISO LF SZ6 (GLOVE) ×1 IMPLANT
GLOVE SURG UNDER POLY LF SZ6.5 (GLOVE) ×3 IMPLANT
GLOVE SURG UNDER POLY LF SZ7 (GLOVE) ×7 IMPLANT
GOWN STRL REUS W/TWL LRG LVL3 (GOWN DISPOSABLE) ×6 IMPLANT
GOWN STRL REUS W/TWL XL LVL3 (GOWN DISPOSABLE) ×3 IMPLANT
HIBICLENS CHG 4% 4OZ (MISCELLANEOUS) ×2 IMPLANT
KIT TURNOVER CYSTO (KITS) ×2 IMPLANT
MANIFOLD NEPTUNE II (INSTRUMENTS) ×1 IMPLANT
NS IRRIG 500ML POUR BTL (IV SOLUTION) ×1 IMPLANT
PACK VAGINAL WOMENS (CUSTOM PROCEDURE TRAY) ×2 IMPLANT
PAD OB MATERNITY 4.3X12.25 (PERSONAL CARE ITEMS) ×2 IMPLANT
SUT VIC AB 2-0 CT1 18 (SUTURE) ×2 IMPLANT
SUT VIC AB 2-0 CT1 27 (SUTURE) ×2
SUT VIC AB 2-0 CT1 TAPERPNT 27 (SUTURE) ×1 IMPLANT
SUT VIC AB 3-0 CT1 36 (SUTURE) ×2 IMPLANT
SUT VIC AB PLUS 45CM 1-MO-4 (SUTURE) ×5 IMPLANT
SUT VICRYL 1 TIES 12X18 (SUTURE) ×2 IMPLANT
TOWEL OR 17X26 10 PK STRL BLUE (TOWEL DISPOSABLE) ×3 IMPLANT
TRAY FOLEY W/BAG SLVR 14FR LF (SET/KITS/TRAYS/PACK) ×2 IMPLANT

## 2020-10-11 NOTE — Progress Notes (Signed)
10/11/2020 1600 Upon arrival to [facility], pt. Became extremely anxious. Pulling at tubes and lines stating she "had to get out of here". Pt. Assisted safely to standing position with RN. Pt. Stated that she has struggled with panic attacks in the past but had hoped she would not have one while in the hospital. States her last panic attack was in March of this year. She was tearful, hands trembling and obviously very upset. RN assisted patient in ambulating in hallway and verbally de-escalating behavior until patient felt that she was able to sit on side of bed. Pt. Continued to be tearful and outwardly apologetic to staff and very anxious. Pt. Stated to RN that she had taken something "a little stronger than a benadryl" in the past but did not want any strong sedative. Dr. Rip Harbour called and verbal orders received for Vistaril 25 mg PO Q6 prn for anxiety and also a one time order for Ativan 1mg  IV if needed and panic attack continued. Orders enacted and pt. Updated on plan of care. Family at bedside for emotional support for patient.  Kristine Kelly, Arville Lime

## 2020-10-11 NOTE — Op Note (Signed)
[facility] PROCEDURE DATE: 10/11/2020  PREOPERATIVE DIAGNOSIS:  Symptomatic fibroids, DUB POSTOPERATIVE DIAGNOSIS:  Symptomatic fibroids, DUB SURGEON:   Chancy Milroy M.D., Cherlynn June ASSISTANTAmmie Ferrier, M.D. An experienced assistant was required given the standard of surgical care given the complexity of the case.  This assistant was needed for exposure, dissection, suctioning, retraction, and overall assistance with the procedure   Procedure: Total vaginal hysterectomy with right salpingectomy ANESTHESIA:  General endotracheal.  INDICATIONS: The patient is a 50 y.o. [id number] with history of symptomatic uterine fibroids/menorrhagia. The patient made a decision to undergo definite surgical treatment. On the preoperative visit, the risks, benefits, indications, and alternatives of the procedure were reviewed with the patient.  On the day of surgery, the risks of surgery were again discussed with the patient including but not limited to: bleeding which may require transfusion or reoperation; infection which may require antibiotics; injury to bowel, bladder, ureters or other surrounding organs; need for additional procedures; thromboembolic phenomenon, incisional problems and other postoperative/anesthesia complications. Written informed consent was obtained.    OPERATIVE FINDINGS: A 8 week size uterus with normal tubes and ovaries bilaterally.  ESTIMATED BLOOD LOSS: 50 ml FLUIDS:  As recorded URINE OUTPUT: As recorded SPECIMENS:  Uterus, cervix and tube sent to pathology COMPLICATIONS:  None immediate.  DESCRIPTION OF PROCEDURE:  The patient received intravenous antibiotics and had sequential compression devices applied to her lower extremities while in the preoperative area.  She was then taken to the operating room where general anesthesia was administered and was found to be adequate.  She was placed in the dorsal lithotomy position, and was prepped and draped in a sterile manner.  The  patients bladder was drained with a red rubber catheter. After an adequate timeout was performed, attention was turned to her pelvis.  A weighted speculum was then placed in the vagina, and the posterior lip of the cervix was grasped with tenaculum.   The posterior cul de sac was then entered sharply. Long bill speculum was placed. The anterior lip the cervix was grasped with tenaculum.   The cervix was then circumferentially incised, and the bladder was dissected off the pubocervical fascia without complication. Ant entry was gained anteriorly. Ziplen clamps were then used to clamp the uterosacral ligaments on either side.  They were then cut and sutured ligated with 0 Vicryl. Of note, all sutures used in this case were 0 Vicryl unless otherwise noted.   The cardinal ligaments were then clamped, cut and ligated.  The uterine vessels and broad ligaments were then serially clamped with Zeplin clamps, cut, and suture ligated on both sides.  The final pedicle involving the IP was clamped, cut and tied with a free tie and then with a 0 vicryl in a Bonney fashion. The specimen was then pasted off for pathology. The right tube was identified and grasped with a Babcock, clamped, cut and tied with a free tie.    All pedicles from the uterosacral ligament to the cornua were examined, hemostasis was confirmed. The peritoneum was closed in a purse string fashion with a 2/0 Vicryl.  The vaginal cuff was closed with 3/0 Vicryl.    All instruments were then removed from the pelvis. The patient tolerated the procedure well.  All instruments, needles, and sponge counts were correct x 2. The patient was taken to the recovery room in stable condition.

## 2020-10-11 NOTE — Progress Notes (Signed)
Dr. Rip Harbour called with urine output 250 cc in 8 hrs updateted on VS. Orders received

## 2020-10-11 NOTE — Anesthesia Preprocedure Evaluation (Addendum)
Anesthesia Evaluation  Patient identified by MRN, date of birth, ID band Patient awake    Reviewed: Allergy & Precautions, NPO status , Patient's Chart, lab work & pertinent test results  History of Anesthesia Complications Negative for: history of anesthetic complications  Airway Mallampati: II  TM Distance: >3 FB Neck ROM: Full    Dental  (+) Dental Advisory Given   Pulmonary neg pulmonary ROS,  10/07/2020 SARS coronavirus NEG   breath sounds clear to auscultation       Cardiovascular hypertension (no longer on medication),  Rhythm:Regular Rate:Normal     Neuro/Psych  Headaches, Anxiety Depression    GI/Hepatic Neg liver ROS, GERD  Medicated and Controlled,  Endo/Other  Morbid obesity  Renal/GU      Musculoskeletal  (+) Arthritis ,   Abdominal (+) + obese,   Peds  Hematology negative hematology ROS (+)   Anesthesia Other Findings   Reproductive/Obstetrics                            Anesthesia Physical Anesthesia Plan  ASA: 3  Anesthesia Plan: General   Post-op Pain Management:    Induction:   PONV Risk Score and Plan: 3 and Ondansetron, Dexamethasone and Scopolamine patch - Pre-op  Airway Management Planned: Oral ETT  Additional Equipment: None  Intra-op Plan:   Post-operative Plan: Extubation in OR  Informed Consent: I have reviewed the patients History and Physical, chart, labs and discussed the procedure including the risks, benefits and alternatives for the proposed anesthesia with the patient or authorized representative who has indicated his/her understanding and acceptance.     Dental advisory given  Plan Discussed with: CRNA and Surgeon  Anesthesia Plan Comments:        Anesthesia Quick Evaluation

## 2020-10-11 NOTE — Anesthesia Procedure Notes (Signed)
Procedure Name: Intubation Date/Time: 10/11/2020 12:10 PM Performed by: Justice Rocher, CRNA Pre-anesthesia Checklist: Patient identified, Emergency Drugs available, Suction available, Patient being monitored and Timeout performed Patient Re-evaluated:Patient Re-evaluated prior to induction Oxygen Delivery Method: Circle system utilized Preoxygenation: Pre-oxygenation with 100% oxygen Induction Type: IV induction Ventilation: Mask ventilation without difficulty Laryngoscope Size: Mac and 3 Grade View: Grade II Tube type: Oral Tube size: 7.0 mm Number of attempts: 1 Airway Equipment and Method: Stylet and Oral airway Placement Confirmation: ETT inserted through vocal cords under direct vision, positive ETCO2, breath sounds checked- equal and bilateral and CO2 detector Secured at: 22 cm Tube secured with: Tape Dental Injury: Teeth and Oropharynx as per pre-operative assessment

## 2020-10-11 NOTE — Anesthesia Postprocedure Evaluation (Signed)
Anesthesia Post Note  Patient: Kristine Kelly  Procedure(s) Performed: TOTAL HYSTERECTOMY VAGINAL with RIGHT SALPINGECTOMY (Vagina )     Patient location during evaluation: PACU Anesthesia Type: General Level of consciousness: awake and alert, patient cooperative and oriented Pain management: pain level controlled Vital Signs Assessment: post-procedure vital signs reviewed and stable Respiratory status: spontaneous breathing, nonlabored ventilation and respiratory function stable Cardiovascular status: blood pressure returned to baseline and stable Postop Assessment: no apparent nausea or vomiting Anesthetic complications: no   No notable events documented.  Last Vitals:  Vitals:   10/11/20 1345 10/11/20 1401  BP: 128/85 122/80  Pulse: 80 (!) 59  Resp: 20 20  Temp:    SpO2: 98% 98%    Last Pain:  Vitals:   10/11/20 1401  PainSc: 3                  Alyx Gee,E. Shadavia Dampier

## 2020-10-11 NOTE — Transfer of Care (Signed)
Immediate Anesthesia Transfer of Care Note  Patient: Kristine Kelly  Procedure(s) Performed: Procedure(s): TOTAL HYSTERECTOMY VAGINAL with RIGHT SALPINGECTOMY  Patient Location: PACU  Anesthesia Type: General  Level of Consciousness: awake, sedated, patient cooperative and responds to stimulation  Airway & Oxygen Therapy: Patient Spontanous Breathing and Patient connected to [place] 02 and soft FM   Post-op Assessment: Report given to PACU RN, Post -op Vital signs reviewed and stable and Patient moving all extremities  Post vital signs: Reviewed and stable  Complications: No apparent anesthesia complications

## 2020-10-11 NOTE — H&P (Signed)
Kristine Kelly is an 50 y.o. female with DUB and uterine fibroids, refractory to medical therapy. She desires definite therapy.  GYN U/S, vol 80 ml, small uterine fibroids. H/O TSVD x 3    Menstrual History: Menarche age: 49 Patient's last menstrual period was 09/22/2020.    Past Medical History:  Diagnosis Date   Anxiety    NO RECENT ANXIETY ATTACKS PER PT ON 10-04-2020   Arthritis    OA LEFT KNEE   Claustrophobia 10/04/2020   COVID-19 2020   COUGH SOB X 6 WEEKS ALL SYMPTOMS RESOLVED   GERD (gastroesophageal reflux disease)    Headache    Hypertension    not currently on medications   Wears glasses 10/04/2020    Past Surgical History:  Procedure Laterality Date   BREAST LUMPECTOMY Right 1991   BENIGN   CHOLECYSTECTOMY  2006   LAPAROSCOPIC   DILATATION AND CURETTAGE/HYSTEROSCOPY WITH MINERVA N/A 07/30/2018   Procedure: DILATATION AND CURETTAGE/HYSTEROSCOPY WITH MINERVA;  Surgeon: Emily Filbert, MD;  Location: [facility];  Service: Gynecology;  Laterality: N/A;   TUBAL LIGATION     21 YRS AGO PER PT ON 10-04-2020    Family History  Problem Relation Age of Onset   Asthma Mother     Social History:  reports that she has never smoked. She has never used smokeless tobacco. She reports that she does not drink alcohol and does not use drugs.  Allergies:  Allergies  Allergen Reactions   Aspirin Other (See Comments)    HEADACHE, DIZZINESS   Atenolol Other (See Comments)    HEADACHES DIZZINESS    Medications Prior to Admission  Medication Sig Dispense Refill Last Dose   estrogen, conjugated,-medroxyprogesterone (PREMPRO) 0.625-5 MG tablet Take 1 tablet by mouth daily. (Patient taking differently: Take 1 tablet by mouth every evening.) 30 tablet 5 Past Month   Multiple Vitamins-Minerals (MULTIVITAMIN WITH MINERALS) tablet Take 1 tablet by mouth daily.   Past Month   omeprazole (PRILOSEC) 20 MG capsule Take 20 mg by mouth as needed.   Past Month    Review  of Systems  Constitutional: Negative.   Respiratory: Negative.    Cardiovascular: Negative.   Gastrointestinal: Negative.   Genitourinary:  Positive for menstrual problem.   Blood pressure (!) 159/95, pulse (!) 115, temperature 98.1 F (36.7 C), resp. rate 17, height 5\' 7"  (1.702 m), weight 109.8 kg, last menstrual period 09/22/2020, SpO2 100 %. Physical Exam Constitutional:      Appearance: Normal appearance.  Cardiovascular:     Rate and Rhythm: Normal rate and regular rhythm.  Pulmonary:     Effort: Pulmonary effort is normal.     Breath sounds: Normal breath sounds.  Abdominal:     General: Bowel sounds are normal.     Palpations: Abdomen is soft.  Genitourinary:    Comments: Nl EGBUS, uterus small < 8 weeks, mobile, non tender, no masses or tenderness Neurological:     Mental Status: She is alert.    Results for orders placed or performed during the hospital encounter of 10/11/20 (from the past 24 hour(s))  Pregnancy, urine POC     Status: None   Collection Time: 10/11/20 10:21 AM  Result Value Ref Range   Preg Test, Ur NEGATIVE NEGATIVE  ABO/Rh     Status: None   Collection Time: 10/11/20 10:40 AM  Result Value Ref Range   ABO/RH(D)      B POS Performed at [facility] [place]  [address], [place], [place] [address]     No results found.  Assessment/Plan: DUB Uterine Fibroids  Pt desires definite therapy. R/B/post op care reviewed with pt. Pt has verbalized understanding and desires to proceed.   Chancy Milroy 10/11/2020, 11:31 AM

## 2020-10-12 ENCOUNTER — Encounter (HOSPITAL_BASED_OUTPATIENT_CLINIC_OR_DEPARTMENT_OTHER): Payer: Self-pay | Admitting: Obstetrics and Gynecology

## 2020-10-12 DIAGNOSIS — D259 Leiomyoma of uterus, unspecified: Secondary | ICD-10-CM | POA: Diagnosis not present

## 2020-10-12 MED ORDER — KETOROLAC TROMETHAMINE 30 MG/ML IJ SOLN
INTRAMUSCULAR | Status: AC
  Filled 2020-10-12: qty 1

## 2020-10-12 MED ORDER — OXYCODONE-ACETAMINOPHEN 5-325 MG PO TABS: 1.0000 | ORAL_TABLET | ORAL | 0 refills | Status: DC | PRN

## 2020-10-12 MED ORDER — HYDROXYZINE HCL 25 MG PO TABS: 25.0000 mg | ORAL_TABLET | Freq: Four times a day (QID) | ORAL | 0 refills | Status: AC | PRN

## 2020-10-12 MED ORDER — IBUPROFEN 800 MG PO TABS: 800.0000 mg | ORAL_TABLET | Freq: Three times a day (TID) | ORAL | 0 refills | Status: AC

## 2020-10-12 NOTE — Discharge Summary (Signed)
Physician Discharge Summary  Patient ID: Kristine Kelly MRN: [id number] DOB/AGE: 1970/07/02 50 y.o.  Admit date: 10/11/2020 Discharge date: 10/12/2020  Admission Diagnoses: DUB and uterine fibroids  Discharge Diagnoses:  Active Problems:   DUB (dysfunctional uterine bleeding)   Discharged Condition: good  Hospital Course: Kristine Kelly was admitted with above Dx. See underwent TVH with right salpingectomy without problems. See OP note for additional information. Post op course was unremarkable. She progressed to ambulating, voiding, tolerating diet and good oral pain control. Felt amendable for discharge home. Discharge instructions, medications and follow up reviewed with pt. Pt verbalized understanding and was discharged home  Consults: None  Significant Diagnostic Studies: labs  Treatments: surgery: TVH with RS  Discharge Exam: Blood pressure 138/85, pulse 79, temperature 98.8 F (37.1 C), resp. rate 16, height 5\' 7"  (1.702 m), weight 109.8 kg, last menstrual period 09/22/2020, SpO2 100 %. Lungs clear Heart RRR Abd soft + BS GU no bleeding Ext non tender  Disposition: Discharge disposition: 01-Home or Self Care       Discharge Instructions     Call MD for:  difficulty breathing, headache or visual disturbances   Complete by: As directed    Call MD for:  extreme fatigue   Complete by: As directed    Call MD for:  hives   Complete by: As directed    Call MD for:  persistant dizziness or light-headedness   Complete by: As directed    Call MD for:  persistant nausea and vomiting   Complete by: As directed    Call MD for:  redness, tenderness, or signs of infection (pain, swelling, redness, odor or green/yellow discharge around incision site)   Complete by: As directed    Call MD for:  severe uncontrolled pain   Complete by: As directed    Call MD for:  temperature >100.4   Complete by: As directed    Diet - low sodium heart healthy   Complete by: As directed     Increase activity slowly   Complete by: As directed    Sexual Activity Restrictions   Complete by: As directed    Pelvic rest x 4 weeks      Allergies as of 10/12/2020       Reactions   Aspirin Other (See Comments)   HEADACHE, DIZZINESS   Atenolol Other (See Comments)   HEADACHES DIZZINESS        Medication List     STOP taking these medications    Prempro 0.625-5 MG tablet Generic drug: estrogen (conjugated)-medroxyprogesterone       TAKE these medications    hydrOXYzine 25 MG tablet Commonly known as: ATARAX/VISTARIL Take 1 tablet (25 mg total) by mouth every 6 (six) hours as needed for anxiety.   ibuprofen 800 MG tablet Commonly known as: ADVIL Take 1 tablet (800 mg total) by mouth 3 (three) times daily.   multivitamin with minerals tablet Take 1 tablet by mouth daily.   omeprazole 20 MG capsule Commonly known as: PRILOSEC Take 20 mg by mouth as needed.   oxyCODONE-acetaminophen 5-325 MG tablet Commonly known as: PERCOCET/ROXICET Take 1 tablet by mouth every 4 (four) hours as needed for moderate pain.        Follow-up [place] [facility] at [facility] Follow up in 4 week(s).   Specialty: Obstetrics and Gynecology Why: Post op appt with Dr. Gardiner Fanti Contact information: [place] [id number]  [phone]                Signed: Chancy Milroy 10/12/2020, 8:22 AM

## 2020-10-13 LAB — SURGICAL PATHOLOGY

## 2020-10-22 ENCOUNTER — Other Ambulatory Visit: Payer: Self-pay

## 2020-10-24 ENCOUNTER — Other Ambulatory Visit: Payer: Self-pay

## 2020-10-24 ENCOUNTER — Ambulatory Visit
Admission: RE | Admit: 2020-10-24 | Discharge: 2020-10-24 | Disposition: A | Discharge status: Home / Self Care | Payer: Commercial Managed Care - PPO | Source: Ambulatory Visit | Attending: Family | Admitting: Family

## 2020-10-24 DIAGNOSIS — R928 Other abnormal and inconclusive findings on diagnostic imaging of breast: Secondary | ICD-10-CM

## 2020-10-27 ENCOUNTER — Other Ambulatory Visit: Payer: Self-pay

## 2020-10-27 DIAGNOSIS — N898 Other specified noninflammatory disorders of vagina: Secondary | ICD-10-CM

## 2020-10-27 MED ORDER — FLUCONAZOLE 150 MG PO TABS: 150.0000 mg | ORAL_TABLET | Freq: Once | ORAL | 0 refills | Status: AC

## 2020-11-05 ENCOUNTER — Encounter: Payer: Self-pay | Admitting: Radiology

## 2020-11-21 ENCOUNTER — Ambulatory Visit (INDEPENDENT_AMBULATORY_CARE_PROVIDER_SITE_OTHER): Payer: Commercial Managed Care - PPO | Admitting: Obstetrics and Gynecology

## 2020-11-21 ENCOUNTER — Encounter: Payer: Self-pay | Admitting: Obstetrics and Gynecology

## 2020-11-21 ENCOUNTER — Other Ambulatory Visit: Payer: Self-pay

## 2020-11-21 VITALS — BP 139/102 | HR 105 | Ht 67.0 in | Wt 247.2 lb

## 2020-11-21 DIAGNOSIS — Z9889 Other specified postprocedural states: Secondary | ICD-10-CM

## 2020-11-21 DIAGNOSIS — Z90710 Acquired absence of both cervix and uterus: Secondary | ICD-10-CM

## 2020-11-21 DIAGNOSIS — N898 Other specified noninflammatory disorders of vagina: Secondary | ICD-10-CM

## 2020-11-21 MED ORDER — METRONIDAZOLE 0.75 % VA GEL: 1.0000 | Freq: Every day | VAGINAL | 1 refills | Status: AC

## 2020-11-21 NOTE — Progress Notes (Signed)
Pt states is having a little pain w/. Clear discharge.

## 2020-11-21 NOTE — Patient Instructions (Signed)
Health Maintenance, Female Adopting a healthy lifestyle and getting preventive care are important in promoting health and wellness. Ask your health care provider about: The right schedule for you to have regular tests and exams. Things you can do on your own to prevent diseases and keep yourself healthy. What should I know about diet, weight, and exercise? Eat a healthy diet  Eat a diet that includes plenty of vegetables, fruits, low-fat dairy products, and lean protein. Do not eat a lot of foods that are high in solid fats, added sugars, or sodium. Maintain a healthy weight Body mass index (BMI) is used to identify weight problems. It estimates body fat based on height and weight. Your health care provider can help determine your BMI and help you achieve or maintain a healthy weight. Get regular exercise Get regular exercise. This is one of the most important things you can do for your health. Most adults should: Exercise for at least 150 minutes each week. The exercise should increase your heart rate and make you sweat (moderate-intensity exercise). Do strengthening exercises at least twice a week. This is in addition to the moderate-intensity exercise. Spend less time sitting. Even light physical activity can be beneficial. Watch cholesterol and blood lipids Have your blood tested for lipids and cholesterol at 50 years of age, then have this test every 5 years. Have your cholesterol levels checked more often if: Your lipid or cholesterol levels are high. You are older than 50 years of age. You are at high risk for heart disease. What should I know about cancer screening? Depending on your health history and family history, you may need to have cancer screening at various ages. This may include screening for: Breast cancer. Cervical cancer. Colorectal cancer. Skin cancer. Lung cancer. What should I know about heart disease, diabetes, and high blood pressure? Blood pressure and heart  disease High blood pressure causes heart disease and increases the risk of stroke. This is more likely to develop in people who have high blood pressure readings, are of African descent, or are overweight. Have your blood pressure checked: Every 3-5 years if you are 18-39 years of age. Every year if you are 40 years old or older. Diabetes Have regular diabetes screenings. This checks your fasting blood sugar level. Have the screening done: Once every three years after age 40 if you are at a normal weight and have a low risk for diabetes. More often and at a younger age if you are overweight or have a high risk for diabetes. What should I know about preventing infection? Hepatitis B If you have a higher risk for hepatitis B, you should be screened for this virus. Talk with your health care provider to find out if you are at risk for hepatitis B infection. Hepatitis C Testing is recommended for: Everyone born from 1945 through 1965. Anyone with known risk factors for hepatitis C. Sexually transmitted infections (STIs) Get screened for STIs, including gonorrhea and chlamydia, if: You are sexually active and are younger than 50 years of age. You are older than 50 years of age and your health care provider tells you that you are at risk for this type of infection. Your sexual activity has changed since you were last screened, and you are at increased risk for chlamydia or gonorrhea. Ask your health care provider if you are at risk. Ask your health care provider about whether you are at high risk for HIV. Your health care provider may recommend a prescription medicine   to help prevent HIV infection. If you choose to take medicine to prevent HIV, you should first get tested for HIV. You should then be tested every 3 months for as long as you are taking the medicine. Pregnancy If you are about to stop having your period (premenopausal) and you may become pregnant, seek counseling before you get  pregnant. Take 400 to 800 micrograms (mcg) of folic acid every day if you become pregnant. Ask for birth control (contraception) if you want to prevent pregnancy. Osteoporosis and menopause Osteoporosis is a disease in which the bones lose minerals and strength with aging. This can result in bone fractures. If you are 65 years old or older, or if you are at risk for osteoporosis and fractures, ask your health care provider if you should: Be screened for bone loss. Take a calcium or vitamin D supplement to lower your risk of fractures. Be given hormone replacement therapy (HRT) to treat symptoms of menopause. Follow these instructions at home: Lifestyle Do not use any products that contain nicotine or tobacco, such as cigarettes, e-cigarettes, and chewing tobacco. If you need help quitting, ask your health care provider. Do not use street drugs. Do not share needles. Ask your health care provider for help if you need support or information about quitting drugs. Alcohol use Do not drink alcohol if: Your health care provider tells you not to drink. You are pregnant, may be pregnant, or are planning to become pregnant. If you drink alcohol: Limit how much you use to 0-1 drink a day. Limit intake if you are breastfeeding. Be aware of how much alcohol is in your drink. In the [place] one drink equals one 12 oz bottle of beer (355 mL), one 5 oz glass of wine (148 mL), or one 1 oz glass of hard liquor (44 mL). General instructions Schedule regular health, dental, and eye exams. Stay current with your vaccines. Tell your health care provider if: You often feel depressed. You have ever been abused or do not feel safe at home. Summary Adopting a healthy lifestyle and getting preventive care are important in promoting health and wellness. Follow your health care provider's instructions about healthy diet, exercising, and getting tested or screened for diseases. Follow your health care provider's  instructions on monitoring your cholesterol and blood pressure. This information is not intended to replace advice given to you by your health care provider. Make sure you discuss any questions you have with your health care provider. Document Revised: 03/18/2020 Document Reviewed: 01/01/2018 Elsevier Patient Education  2022 Elsevier Inc.  

## 2020-11-21 NOTE — Progress Notes (Signed)
Kristine Kelly presents for post op visit S/P TVH with right salpingectomy Pathology reviewed with pt Denies any bowel or bladder dysfunction. Tolerating diet Some cramps at times. Some vaginal discharge.  PE AF VSS Lungs clear Heart RRR Abd soft + BS GU nl EGBUS, some yellow discharge, cuff healing well, no masses or tenderness  A/P Post op visit        Vaginal discharge  Overall doing well. Increase ADL's as tolerates. Metrogel for discharge. Return to work provided. F/U in 1 yr or PRN

## 2021-09-29 NOTE — Progress Notes (Unsigned)
Patient ID: Kristine Kelly, female    DOB: December 21, 1970  MRN: [id number]  CC: Blood Pressure Check  Subjective: Kristine Kelly is a 51 y.o. female who presents for blood pressure check.   Her concerns today include: ***  Patient Active Problem List   Diagnosis Date Noted   Post-operative state 11/21/2020   H/O vaginal hysterectomy 11/21/2020   Vaginal discharge 10/05/2020   Menopausal symptoms 09/01/2020   Dermatophytosis 05/25/2020   Obesity 04/28/2018   Anxiety and depression 10/04/2016     Current Outpatient Medications on File Prior to Visit  Medication Sig Dispense Refill   hydrOXYzine (ATARAX/VISTARIL) 25 MG tablet Take 1 tablet (25 mg total) by mouth every 6 (six) hours as needed for anxiety. 30 tablet 0   ibuprofen (ADVIL) 800 MG tablet Take 1 tablet (800 mg total) by mouth 3 (three) times daily. 30 tablet 0   metroNIDAZOLE (METROGEL) 0.75 % vaginal gel Place 1 Applicatorful vaginally at bedtime. Apply one applicatorful to vagina at bedtime for 5 days 70 g 1   omeprazole (PRILOSEC) 20 MG capsule Take 20 mg by mouth as needed.     No current facility-administered medications on file prior to visit.    Allergies  Allergen Reactions   Aspirin Other (See Comments)    HEADACHE, DIZZINESS   Atenolol Other (See Comments)    HEADACHES DIZZINESS    Social History   Socioeconomic History   Marital status: Single    Spouse name: Not on file   Number of children: Not on file   Years of education: Not on file   Highest education level: Not on file  Occupational History   Not on file  Tobacco Use   Smoking status: Never   Smokeless tobacco: Never  Vaping Use   Vaping Use: Never used  Substance and Sexual Activity   Alcohol use: No   Drug use: No   Sexual activity: Yes    Birth control/protection: Surgical  Other Topics Concern   Not on file  Social History Narrative   Not on file   Social Determinants of Health   Financial Resource Strain: Not on file  Food  Insecurity: No Food Insecurity (04/28/2018)   Hunger Vital Sign    Worried About Running Out of Food in the Last Year: Never true    Ran Out of Food in the Last Year: Never true  Transportation Needs: No Transportation Needs (04/28/2018)   PRAPARE - Administrator, Civil Service (Medical): No    Lack of Transportation (Non-Medical): No  Physical Activity: Not on file  Stress: Not on file  Social Connections: Not on file  Intimate Partner Violence: Not on file    Family History  Problem Relation Age of Onset   Asthma Mother     Past Surgical History:  Procedure Laterality Date   BREAST LUMPECTOMY Right 1991   BENIGN   CHOLECYSTECTOMY  2006   LAPAROSCOPIC   DILATATION AND CURETTAGE/HYSTEROSCOPY WITH MINERVA N/A 07/30/2018   Procedure: DILATATION AND CURETTAGE/HYSTEROSCOPY WITH MINERVA;  Surgeon: Allie Bossier, MD;  Location: [facility];  Service: Gynecology;  Laterality: N/A;   TUBAL LIGATION     21 YRS AGO PER PT ON 10-04-2020   VAGINAL HYSTERECTOMY  10/11/2020   Procedure: TOTAL HYSTERECTOMY VAGINAL with RIGHT SALPINGECTOMY;  Surgeon: Hermina Staggers, MD;  Location: [facility];  Service: Gynecology;;    ROS: Review of Systems Negative except as stated above  PHYSICAL  EXAM: LMP 09/22/2020   Physical Exam  {female adult master:310786} {female adult master:310785}     Latest Ref Rng & Units 10/11/2020   11:14 PM 10/07/2020    3:02 PM 08/22/2020    7:53 AM  CMP  Glucose 70 - 99 mg/dL 161  77  096   BUN 6 - 20 mg/dL 8  12  9    Creatinine 0.44 - 1.00 mg/dL 0.45  4.09  8.11   Sodium 135 - 145 mmol/L 134  140  137   Potassium 3.5 - 5.1 mmol/L 4.0  4.1  4.0   Chloride 98 - 111 mmol/L 106  111  105   CO2 22 - 32 mmol/L 21  23  23    Calcium 8.9 - 10.3 mg/dL 8.7  9.4  9.3   Total Protein 6.5 - 8.1 g/dL   8.7   Total Bilirubin 0.3 - 1.2 mg/dL   1.3   Alkaline Phos 38 - 126 U/L   45   AST 15 - 41 U/L   28   ALT 0 - 44 U/L   24    Lipid  Panel     Component Value Date/Time   CHOL 197 05/25/2020 1648   TRIG 93 05/25/2020 1648   HDL 54 05/25/2020 1648   CHOLHDL 3.6 05/25/2020 1648   CHOLHDL 4.2 Ratio 10/13/2008 2046   VLDL 25 10/13/2008 2046   LDLCALC 126 (H) 05/25/2020 1648    CBC    Component Value Date/Time   WBC 11.6 (H) 10/11/2020 2314   RBC 4.03 10/11/2020 2314   HGB 11.9 (L) 10/11/2020 2314   HGB 13.9 08/07/2020 0917   HCT 35.3 (L) 10/11/2020 2314   HCT 41.5 08/07/2020 0917   PLT 198 10/11/2020 2314   PLT 209 08/07/2020 0917   MCV 87.6 10/11/2020 2314   MCV 88 08/07/2020 0917   MCH 29.5 10/11/2020 2314   MCHC 33.7 10/11/2020 2314   RDW 13.2 10/11/2020 2314   RDW 13.1 08/07/2020 0917   LYMPHSABS 1.2 08/22/2020 0753   LYMPHSABS 2.3 05/10/2019 1628   MONOABS 0.5 08/22/2020 0753   EOSABS 0.0 08/22/2020 0753   EOSABS 0.0 05/10/2019 1628   BASOSABS 0.0 08/22/2020 0753   BASOSABS 0.0 05/10/2019 1628    ASSESSMENT AND PLAN:  There are no diagnoses linked to this encounter.   Patient was given the opportunity to ask questions.  Patient verbalized understanding of the plan and was able to repeat key elements of the plan. Patient was given clear instructions to go to Emergency Department or return to medical center if symptoms don't improve, worsen, or new problems develop.The patient verbalized understanding.   No orders of the defined types were placed in this encounter.    Requested Prescriptions    No prescriptions requested or ordered in this encounter    No follow-ups on file.  Rema Fendt, NP

## 2021-10-02 ENCOUNTER — Encounter: Payer: Commercial Managed Care - PPO | Admitting: Family

## 2021-10-04 NOTE — Progress Notes (Signed)
Patient ID: Kristine Kelly, female    DOB: [birth year]  MRN: [id number]  CC: Blood Pressure Check  Subjective: Daisie Nickelson is a 51 y.o. female who presents for blood pressure check.   Her concerns today include:  - Reports 2 weeks ago while at dentist for a procedure her blood pressure was 160's/100's. As a result she had to cancel dental procedure and was told to follow-up with primary care. She does have a history of high blood pressure but reports has never been prescribed medication for treatment because she does not like taking medications.  - Having intermittent bilateral leg pains. Over-the-counter Ibuprofen and Tylenol help. Denies red flag symptoms. Thinks leg pain related to working two jobs daily which total 14 hours. Also, has lower back pain (wearing back brace to helps) and hip pain. Reports back pain started after hysterectomy in September 2022.   Patient Active Problem List   Diagnosis Date Noted   Post-operative state 11/21/2020   H/O vaginal hysterectomy 11/21/2020   Vaginal discharge 10/05/2020   Menopausal symptoms 09/01/2020   Dermatophytosis 05/25/2020   Obesity 04/28/2018   Anxiety and depression 10/04/2016     Current Outpatient Medications on File Prior to Visit  Medication Sig Dispense Refill   hydrOXYzine (ATARAX/VISTARIL) 25 MG tablet Take 1 tablet (25 mg total) by mouth every 6 (six) hours as needed for anxiety. 30 tablet 0   ibuprofen (ADVIL) 800 MG tablet Take 1 tablet (800 mg total) by mouth 3 (three) times daily. 30 tablet 0   metroNIDAZOLE (METROGEL) 0.75 % vaginal gel Place 1 Applicatorful vaginally at bedtime. Apply one applicatorful to vagina at bedtime for 5 days 70 g 1   omeprazole (PRILOSEC) 20 MG capsule Take 20 mg by mouth as needed.     No current facility-administered medications on file prior to visit.    Allergies  Allergen Reactions   Aspirin Other (See Comments)    HEADACHE, DIZZINESS   Atenolol Other (See Comments)    HEADACHES  DIZZINESS    Social History   Socioeconomic History   Marital status: Single    Spouse name: Not on file   Number of children: Not on file   Years of education: Not on file   Highest education level: Not on file  Occupational History   Not on file  Tobacco Use   Smoking status: Never   Smokeless tobacco: Never  Vaping Use   Vaping Use: Never used  Substance and Sexual Activity   Alcohol use: No   Drug use: No   Sexual activity: Yes    Birth control/protection: Surgical  Other Topics Concern   Not on file  Social History Narrative   Not on file   Social Determinants of Health   Financial Resource Strain: Not on file  Food Insecurity: No Food Insecurity (04/28/2018)   Hunger Vital Sign    Worried About Running Out of Food in the Last Year: Never true    Ran Out of Food in the Last Year: Never true  Transportation Needs: No Transportation Needs (04/28/2018)   PRAPARE - Hydrologist (Medical): No    Lack of Transportation (Non-Medical): No  Physical Activity: Not on file  Stress: Not on file  Social Connections: Not on file  Intimate Partner Violence: Not on file    Family History  Problem Relation Age of Onset   Asthma Mother     Past Surgical History:  Procedure Laterality Date  BREAST LUMPECTOMY Right 1991   BENIGN   CHOLECYSTECTOMY  2006   LAPAROSCOPIC   DILATATION AND CURETTAGE/HYSTEROSCOPY WITH MINERVA N/A 07/30/2018   Procedure: DILATATION AND CURETTAGE/HYSTEROSCOPY WITH MINERVA;  Surgeon: Emily Filbert, MD;  Location: [place];  Service: Gynecology;  Laterality: N/A;   TUBAL LIGATION     21 YRS AGO PER PT ON 10-04-2020   VAGINAL HYSTERECTOMY  10/11/2020   Procedure: TOTAL HYSTERECTOMY VAGINAL with RIGHT SALPINGECTOMY;  Surgeon: Chancy Milroy, MD;  Location: [place];  Service: Gynecology;;    ROS: Review of Systems Negative except as stated above  PHYSICAL EXAM: BP 128/84 (BP Location:  Left Arm, Patient Position: Sitting, Cuff Size: Large)   Pulse 76   Temp 98.3 F (36.8 C)   Resp 16   Ht 5' 7.01" (1.702 m)   LMP 09/22/2020   SpO2 96%   BMI 38.71 kg/m   Physical Exam HENT:     Head: Normocephalic and atraumatic.  Eyes:     Extraocular Movements: Extraocular movements intact.     Conjunctiva/sclera: Conjunctivae normal.     Pupils: Pupils are equal, round, and reactive to light.  Cardiovascular:     Rate and Rhythm: Normal rate and regular rhythm.     Pulses: Normal pulses.     Heart sounds: Normal heart sounds.  Pulmonary:     Effort: Pulmonary effort is normal.     Breath sounds: Normal breath sounds.  Musculoskeletal:     Cervical back: Normal range of motion and neck supple.     Right knee: Normal.     Left knee: Normal.     Right lower leg: Normal.     Left lower leg: Normal.     Right ankle: Normal.     Left ankle: Normal.  Neurological:     General: No focal deficit present.     Mental Status: She is alert and oriented to person, place, and time.  Psychiatric:        Mood and Affect: Mood normal.        Behavior: Behavior normal.     ASSESSMENT AND PLAN: 1. Primary hypertension - Blood pressure today at goal. No history of pharmacological management per patient preference.  - Counseled on blood pressure goal of less than 130/80, low-sodium, DASH diet, and 150 minutes of moderate intensity exercise per week as tolerated.  - Update BMP. - Follow-up with primary provider in 3 months or sooner if needed.  - Basic Metabolic Panel  2. Prediabetes - Update lab. - Hemoglobin A1c  3. Screening cholesterol level - Update lab.  - Lipid Panel  4. Chronic pain of both lower extremities - No red flag symptoms.  - Continue over-the-counter analgesics. Discussed to not overuse.   - Discussed conservative measures for treatment including but not limited to compression socks, rest, and elevation of lower extremities. She is aware to follow-up with  me as scheduled.    Patient was given the opportunity to ask questions.  Patient verbalized understanding of the plan and was able to repeat key elements of the plan. Patient was given clear instructions to go to Emergency Department or return to medical center if symptoms don't improve, worsen, or new problems develop.The patient verbalized understanding.   Orders Placed This Encounter  Procedures   Basic Metabolic Panel   Hemoglobin A1c   Lipid Panel    Return in about 3 months (around 01/17/2022) for Follow-Up or next available chronic care mgmt.  Camillia Herter, NP

## 2021-10-18 ENCOUNTER — Ambulatory Visit: Payer: Commercial Managed Care - PPO | Admitting: Family

## 2021-10-18 VITALS — BP 128/84 | HR 76 | Temp 98.3°F | Resp 16 | Ht 67.01 in

## 2021-10-18 DIAGNOSIS — Z8679 Personal history of other diseases of the circulatory system: Secondary | ICD-10-CM

## 2021-10-18 DIAGNOSIS — M79604 Pain in right leg: Secondary | ICD-10-CM | POA: Diagnosis not present

## 2021-10-18 DIAGNOSIS — Z13220 Encounter for screening for lipoid disorders: Secondary | ICD-10-CM

## 2021-10-18 DIAGNOSIS — G8929 Other chronic pain: Secondary | ICD-10-CM

## 2021-10-18 DIAGNOSIS — Z0130 Encounter for examination of blood pressure without abnormal findings: Secondary | ICD-10-CM

## 2021-10-18 DIAGNOSIS — M79605 Pain in left leg: Secondary | ICD-10-CM

## 2021-10-18 DIAGNOSIS — R7303 Prediabetes: Secondary | ICD-10-CM

## 2021-10-18 DIAGNOSIS — I10 Essential (primary) hypertension: Secondary | ICD-10-CM | POA: Diagnosis not present

## 2021-10-18 NOTE — Progress Notes (Signed)
.  Pt presents for hypertension f/u   -experiencing heavy legs feeling and pain=7

## 2021-10-19 LAB — LIPID PANEL
Chol/HDL Ratio: 3.9 ratio (ref 0.0–4.4)
Cholesterol, Total: 202 mg/dL — ABNORMAL HIGH (ref 100–199)
HDL: 52 mg/dL (ref >39)
LDL Chol Calc (NIH): 123 mg/dL — ABNORMAL HIGH (ref 0–99)
Triglycerides: 155 mg/dL — ABNORMAL HIGH (ref 0–149)
VLDL Cholesterol Cal: 27 mg/dL (ref 5–40)

## 2021-10-19 LAB — BASIC METABOLIC PANEL
BUN/Creatinine Ratio: 14 (ref 9–23)
BUN: 13 mg/dL (ref 6–24)
CO2: 20 mmol/L (ref 20–29)
Calcium: 9.4 mg/dL (ref 8.7–10.2)
Chloride: 106 mmol/L (ref 96–106)
Creatinine, Ser: 0.92 mg/dL (ref 0.57–1.00)
Glucose: 79 mg/dL (ref 70–99)
Potassium: 4.1 mmol/L (ref 3.5–5.2)
Sodium: 142 mmol/L (ref 134–144)
eGFR: 75 mL/min/{1.73_m2} (ref >59)

## 2021-10-19 LAB — HEMOGLOBIN A1C
Est. average glucose Bld gHb Est-mCnc: 114 mg/dL
Hgb A1c MFr Bld: 5.6 % (ref 4.8–5.6)

## 2021-11-02 ENCOUNTER — Encounter: Payer: Self-pay | Admitting: Family

## 2021-11-04 ENCOUNTER — Other Ambulatory Visit: Payer: Self-pay | Admitting: Family

## 2021-11-04 DIAGNOSIS — B3731 Acute candidiasis of vulva and vagina: Secondary | ICD-10-CM

## 2021-11-04 MED ORDER — FLUCONAZOLE 150 MG PO TABS: 150.0000 mg | ORAL_TABLET | ORAL | 0 refills | Status: AC

## 2021-11-08 ENCOUNTER — Telehealth: Payer: Self-pay

## 2021-11-08 NOTE — Telephone Encounter (Signed)
Att to contact pt to verify if influenza vaccine had been completed no ans no lvm

## 2022-01-04 NOTE — Progress Notes (Signed)
Patient ID: Kristine Kelly, female    DOB: 1970-11-29  MRN: [id number]  CC: Hip Pain  Subjective: Kristine Kelly is a 51 y.o. female who presents for hip pain.   Her concerns today include:  Left hip and left knee pain began suddenly 3 weeks ago. Since then pain progressing. Noticed right hip pain began recently and thinks related to using the right lower extremity more during walking to relieve pain from the left hip/left knee. Left knee feels like it is going to give out. History of left knee arthritis. Hearing popping sounds of the right hip when walking. Denies recent trauma/injury. Using left knee brace and ice packs to help. Concerned for joint pain. Reports left elbow pain. I discussed with patient suspect this is from overuse. She is working a full-time and a part-time job standing for long amounts of time and using upper extremities repetitively. Reports hysterectomy one year ago. Currently hot flashes comes and goes. Tolerable for now. States Gynecology did not recommend follow-up since hysterectomy.    Patient Active Problem List   Diagnosis Date Noted   Post-operative state 11/21/2020   H/O vaginal hysterectomy 11/21/2020   Vaginal discharge 10/05/2020   Menopausal symptoms 09/01/2020   Dermatophytosis 05/25/2020   Obesity 04/28/2018   Anxiety and depression 10/04/2016     Current Outpatient Medications on File Prior to Visit  Medication Sig Dispense Refill   hydrOXYzine (ATARAX/VISTARIL) 25 MG tablet Take 1 tablet (25 mg total) by mouth every 6 (six) hours as needed for anxiety. 30 tablet 0   ibuprofen (ADVIL) 800 MG tablet Take 1 tablet (800 mg total) by mouth 3 (three) times daily. 30 tablet 0   metroNIDAZOLE (METROGEL) 0.75 % vaginal gel Place 1 Applicatorful vaginally at bedtime. Apply one applicatorful to vagina at bedtime for 5 days 70 g 1   omeprazole (PRILOSEC) 20 MG capsule Take 20 mg by mouth as needed.     No current facility-administered medications on file  prior to visit.    Allergies  Allergen Reactions   Aspirin Other (See Comments)    HEADACHE, DIZZINESS   Atenolol Other (See Comments)    HEADACHES DIZZINESS    Social History   Socioeconomic History   Marital status: Single    Spouse name: Not on file   Number of children: Not on file   Years of education: Not on file   Highest education level: Not on file  Occupational History   Not on file  Tobacco Use   Smoking status: Never   Smokeless tobacco: Never  Vaping Use   Vaping Use: Never used  Substance and Sexual Activity   Alcohol use: No   Drug use: No   Sexual activity: Yes    Birth control/protection: Surgical  Other Topics Concern   Not on file  Social History Narrative   Not on file   Social Determinants of Health   Financial Resource Strain: Not on file  Food Insecurity: No Food Insecurity (04/28/2018)   Hunger Vital Sign    Worried About Running Out of Food in the Last Year: Never true    Ran Out of Food in the Last Year: Never true  Transportation Needs: No Transportation Needs (04/28/2018)   PRAPARE - Hydrologist (Medical): No    Lack of Transportation (Non-Medical): No  Physical Activity: Not on file  Stress: Not on file  Social Connections: Not on file  Intimate Partner Violence: Not on file  Family History  Problem Relation Age of Onset   Asthma Mother     Past Surgical History:  Procedure Laterality Date   BREAST LUMPECTOMY Right 1991   BENIGN   CHOLECYSTECTOMY  2006   LAPAROSCOPIC   DILATATION AND CURETTAGE/HYSTEROSCOPY WITH MINERVA N/A 07/30/2018   Procedure: DILATATION AND CURETTAGE/HYSTEROSCOPY WITH MINERVA;  Surgeon: Emily Filbert, MD;  Location: [facility];  Service: Gynecology;  Laterality: N/A;   TUBAL LIGATION     21 YRS AGO PER PT ON 10-04-2020   VAGINAL HYSTERECTOMY  10/11/2020   Procedure: TOTAL HYSTERECTOMY VAGINAL with RIGHT SALPINGECTOMY;  Surgeon: Chancy Milroy, MD;  Location:  [facility];  Service: Gynecology;;    ROS: Review of Systems Negative except as stated above  PHYSICAL EXAM: BP 122/85 (BP Location: Left Arm, Patient Position: Sitting, Cuff Size: Large)   Pulse 74   Temp 98.3 F (36.8 C)   Resp 16   Ht 5' 7.01" (1.702 m)   Wt 250 lb (113.4 kg)   LMP 09/22/2020   SpO2 95%   BMI 39.15 kg/m   Physical Exam HENT:     Head: Normocephalic and atraumatic.  Eyes:     Extraocular Movements: Extraocular movements intact.     Conjunctiva/sclera: Conjunctivae normal.     Pupils: Pupils are equal, round, and reactive to light.  Cardiovascular:     Rate and Rhythm: Normal rate and regular rhythm.     Pulses: Normal pulses.     Heart sounds: Normal heart sounds.  Pulmonary:     Effort: Pulmonary effort is normal.     Breath sounds: Normal breath sounds.  Musculoskeletal:     Right upper arm: Normal.     Left upper arm: Normal.     Right elbow: Normal.     Left elbow: Tenderness present.     Right forearm: Normal.     Left forearm: Normal.     Right wrist: Normal.     Left wrist: Normal.     Right hand: Normal.     Left hand: Normal.     Cervical back: Normal, normal range of motion and neck supple.     Thoracic back: Normal.     Lumbar back: Normal.     Right hip: Tenderness present.     Left hip: Tenderness present.     Right upper leg: Normal.     Left upper leg: Normal.     Right knee: Normal.     Left knee: Tenderness present.     Right lower leg: Normal.     Left lower leg: Normal.     Right ankle: Normal.     Left ankle: Normal.     Right foot: Normal.     Left foot: Normal.  Neurological:     General: No focal deficit present.     Mental Status: She is alert and oriented to person, place, and time.  Psychiatric:        Mood and Affect: Mood normal.        Behavior: Behavior normal.    ASSESSMENT AND PLAN: 1. Left hip pain 2. Left knee pain, unspecified chronicity 3. Left elbow pain 4. Right hip  pain 5. History of arthritis - Triamcinolone acetonide injection administered in office.  - Acetaminophen as prescribed.  - Referral to Orthopedic Surgery for further evaluation/management.  - triamcinolone acetonide (KENALOG-40) injection 40 mg - Ambulatory referral to Orthopedic Surgery - acetaminophen (TYLENOL) 500 MG tablet; Take  1 tablet (500 mg total) by mouth every 6 (six) hours as needed.  Dispense: 30 tablet; Refill: 1  6. History of total hysterectomy 7. Hot flashes - Patient declined pharmacological management.  - Follow-up as needed.    Patient was given the opportunity to ask questions.  Patient verbalized understanding of the plan and was able to repeat key elements of the plan. Patient was given clear instructions to go to Emergency Department or return to medical center if symptoms don't improve, worsen, or new problems develop.The patient verbalized understanding.   Orders Placed This Encounter  Procedures   Ambulatory referral to Orthopedic Surgery     Requested Prescriptions   Signed Prescriptions Disp Refills   acetaminophen (TYLENOL) 500 MG tablet 30 tablet 1    Sig: Take 1 tablet (500 mg total) by mouth every 6 (six) hours as needed.    Follow-up with primary provider as scheduled.   Camillia Herter, NP

## 2022-01-05 ENCOUNTER — Ambulatory Visit: Payer: Commercial Managed Care - PPO | Admitting: Family

## 2022-01-05 VITALS — BP 122/85 | HR 74 | Temp 98.3°F | Resp 16 | Ht 67.01 in | Wt 250.0 lb

## 2022-01-05 DIAGNOSIS — M25522 Pain in left elbow: Secondary | ICD-10-CM

## 2022-01-05 DIAGNOSIS — M25552 Pain in left hip: Secondary | ICD-10-CM

## 2022-01-05 DIAGNOSIS — M25562 Pain in left knee: Secondary | ICD-10-CM | POA: Diagnosis not present

## 2022-01-05 DIAGNOSIS — Z90710 Acquired absence of both cervix and uterus: Secondary | ICD-10-CM

## 2022-01-05 DIAGNOSIS — M25551 Pain in right hip: Secondary | ICD-10-CM

## 2022-01-05 DIAGNOSIS — R232 Flushing: Secondary | ICD-10-CM

## 2022-01-05 DIAGNOSIS — Z8739 Personal history of other diseases of the musculoskeletal system and connective tissue: Secondary | ICD-10-CM

## 2022-01-05 MED ORDER — ACETAMINOPHEN 500 MG PO TABS: 500.0000 mg | ORAL_TABLET | Freq: Four times a day (QID) | ORAL | 1 refills | Status: DC | PRN

## 2022-01-05 MED ORDER — TRIAMCINOLONE ACETONIDE 40 MG/ML IJ SUSP
40.0000 mg | Freq: Once | INTRAMUSCULAR | Status: AC
  Administered 2022-01-05: 40 mg via INTRAMUSCULAR

## 2022-01-05 NOTE — Progress Notes (Signed)
Pt presents for knee and hip pain  -left knee and hip -right hip  -started about 3 weeks ago, she believes she is having joint pain

## 2022-01-19 ENCOUNTER — Encounter: Payer: Self-pay | Admitting: Physician Assistant

## 2022-01-19 ENCOUNTER — Ambulatory Visit (INDEPENDENT_AMBULATORY_CARE_PROVIDER_SITE_OTHER): Payer: Commercial Managed Care - PPO

## 2022-01-19 ENCOUNTER — Ambulatory Visit (INDEPENDENT_AMBULATORY_CARE_PROVIDER_SITE_OTHER): Payer: Commercial Managed Care - PPO | Admitting: Physician Assistant

## 2022-01-19 DIAGNOSIS — M25551 Pain in right hip: Secondary | ICD-10-CM

## 2022-01-19 DIAGNOSIS — M25562 Pain in left knee: Secondary | ICD-10-CM

## 2022-01-19 MED ORDER — MELOXICAM 15 MG PO TABS: 15.0000 mg | ORAL_TABLET | Freq: Every day | ORAL | 0 refills | Status: DC

## 2022-01-19 NOTE — Progress Notes (Signed)
Office Visit Note   Patient: Kristine Kelly           Date of Birth: 07-20-1970           MRN: [id number] Visit Date: 01/19/2022              Requested by: Camillia Herter, NP [place],  [place] [id number] PCP: Camillia Herter, NP  No chief complaint on file.     HPI: Ms. Venables is a pleasant 51 year old woman with a 1 month history of left lateral knee pain and right hip pain.  She denies any particular injury she said she was simply getting out of bed and began experiencing pain and catching on the lateral side of her left knee.  She thinks this alter her gait and started causing her hip to hurt.  Also having some low back pain but denies any radicular findings.  Her left knee is her main focus.  She has tried treating this with anti-inflammatories and Goody's powder.  She has tolerated these drugs in the past without difficulty though she does say in the remote past she has a history of allergy to aspirin.  She denies significant problems with this knee in the past though she has been active in sports and recalls being in an accident that did cause some pain in the lateral side of this knee.  She does wear a copper embedded knee sleeve which seems to help a little bit  Assessment & Plan: Visit Diagnoses:  1. Acute pain of left knee   2. Pain in right hip     Plan: I she does have some arthritis in the left knee.  Given her mechanical symptoms could also have a meniscus tear.  She is I have offered her different options including injection MRI she like to try an anti-inflammatory.  I have asked that she discontinue all the other anti-inflammatories including the powder Naprosyn Aleve and ibuprofen.  Will try her on a 2-week course of meloxicam.  Also discussed obtaining Voltaren gel and placing this on her knee twice a day.  If she continues to have mechanical symptoms I consider an MRI could also revisit trying an injection  Follow-Up Instructions: Return in about  2 weeks (around 02/02/2022).   Ortho Exam  Patient is alert, oriented, no adenopathy, well-dressed, normal affect, normal respiratory effort. Right hip she has some tenderness in the groin but fairly fluid motion.  She her strength is 5 out of 5 on her right lower extremity no paresthesias she has good flexion and extension of her lower extremities and ankles knees and hips.  In the left knee she has no effusion no redness no erythema no swelling.  She does have some mild swelling on the lateral joint line.  She has pain with terminal extension and flexion reproduced in this area.  Mild patellar grinding good varus valgus stability  Imaging: XR KNEE 3 VIEW LEFT  Result Date: 01/19/2022 Three-view radiographs of her left knee were obtained today.  She does have some slight joint space narrowing medially and under the patellofemoral joint with some sclerotic changes.  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  She does have some sclerotic changes.  No acute fractures are noted  No images are attached to the encounter.  Labs: Lab Results  Component Value Date   HGBA1C 5.6 10/18/2021   HGBA1C 5.7 (H) 05/30/2020   HGBA1C CANCELED 05/25/2020     Lab Results  Component Value Date   ALBUMIN 4.3 08/22/2020   ALBUMIN 4.0 03/26/2020   ALBUMIN 4.1 12/18/2019    No results found for: "MG" No results found for: "VD25OH"  No results found for: "PREALBUMIN"    Latest Ref Rng & Units 10/11/2020   11:14 PM 10/07/2020    3:02 PM 08/22/2020    7:53 AM  CBC EXTENDED  WBC 4.0 - 10.5 K/uL 11.6  7.6  6.7   RBC 3.87 - 5.11 MIL/uL 4.03  4.46  4.85   Hemoglobin 12.0 - 15.0 g/dL 11.9  13.1  14.2   HCT 36.0 - 46.0 % 35.3  40.0  42.6   Platelets 150 - 400 K/uL 198  218  231   NEUT# 1.7 - 7.7 K/uL   5.0   Lymph# 0.7 - 4.0 K/uL   1.2      There is no  height or weight on file to calculate BMI.  Orders:  Orders Placed This Encounter  Procedures  . XR HIP UNILAT W OR W/O PELVIS 2-3 VIEWS RIGHT  . XR KNEE 3 VIEW LEFT   Meds ordered this encounter  Medications  . meloxicam (MOBIC) 15 MG tablet    Sig: Take 1 tablet (15 mg total) by mouth daily.    Dispense:  30 tablet    Refill:  0     Procedures: No procedures performed  Clinical Data: No additional findings.  ROS:  All other systems negative, except as noted in the HPI. Review of Systems  All other systems reviewed and are negative.  Objective: Vital Signs: LMP 09/22/2020   Specialty Comments:  No specialty comments available.  PMFS History: Patient Active Problem List   Diagnosis Date Noted  . Post-operative state 11/21/2020  . H/O vaginal hysterectomy 11/21/2020  . Vaginal discharge 10/05/2020  . Menopausal symptoms 09/01/2020  . Dermatophytosis 05/25/2020  . Obesity 04/28/2018  . Anxiety and depression 10/04/2016   Past Medical History:  Diagnosis Date  . Anxiety    NO RECENT ANXIETY ATTACKS PER PT ON 10-04-2020  . Arthritis    OA LEFT KNEE  . Claustrophobia 10/04/2020  . COVID-19 2020   COUGH SOB X 6 WEEKS ALL SYMPTOMS RESOLVED  . GERD (gastroesophageal reflux disease)   . Headache   . Hypertension    not currently on medications  . Wears glasses 10/04/2020    Family History  Problem Relation Age of Onset  . Asthma Mother     Past Surgical History:  Procedure Laterality Date  . BREAST LUMPECTOMY Right 1991   BENIGN  . CHOLECYSTECTOMY  2006   LAPAROSCOPIC  . DILATATION AND CURETTAGE/HYSTEROSCOPY WITH MINERVA N/A 07/30/2018   Procedure: DILATATION AND CURETTAGE/HYSTEROSCOPY WITH MINERVA;  Surgeon: Emily Filbert, MD;  Location: [facility];  Service: Gynecology;  Laterality: N/A;  . TUBAL LIGATION     21 YRS AGO PER PT ON 10-04-2020  . VAGINAL HYSTERECTOMY  10/11/2020   Procedure: TOTAL HYSTERECTOMY VAGINAL with RIGHT  SALPINGECTOMY;  Surgeon: Chancy Milroy, MD;  Location: [facility];  Service: Gynecology;;   Social History   Occupational History  . Not on file  Tobacco Use  . Smoking status: Never  . Smokeless tobacco: Never  Vaping Use  . Vaping Use: Never used  Substance and Sexual Activity  .  Alcohol use: No  . Drug use: No  . Sexual activity: Yes    Birth control/protection: Surgical

## 2022-01-29 ENCOUNTER — Ambulatory Visit: Payer: Commercial Managed Care - PPO | Admitting: Physician Assistant

## 2022-04-02 IMAGING — CT CT ABD-PELV W/ CM
2 of 5 series · 16 of 46 positions shown, 18 images · IV contrast (omnipaque)
Comparison: Ultrasound August 19, 2020,March 27, 2020 and CT chest
December 18, 2019 and lumbar radiograph July 26, 2005

CLINICAL DATA: Acute nonlocalized abdominal pain.

EXAM:
CT ABDOMEN AND PELVIS WITH CONTRAST
TECHNIQUE: Multidetector CT imaging of the abdomen and pelvis was performed
using the standard protocol following bolus administration of
intravenous contrast.
CONTRAST:  80mL OMNIPAQUE IOHEXOL 350 MG/ML SOLN

[Series 2: axial st · axial · 0.87mm/px · z∈[+983,+1413]mm · 13 of 98 slices shown, 15 images]
[im 6/98  soft-tissue]
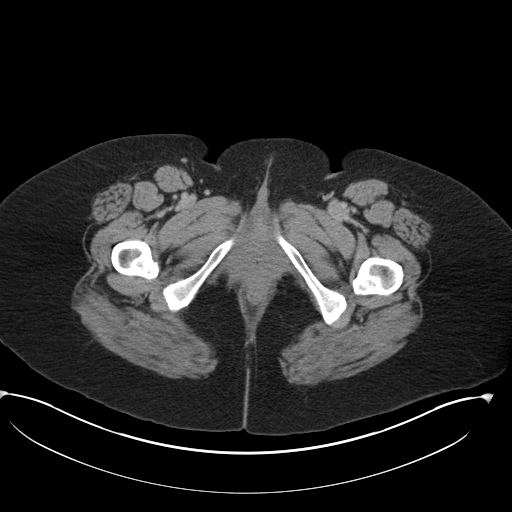
[im 6/98  bone]
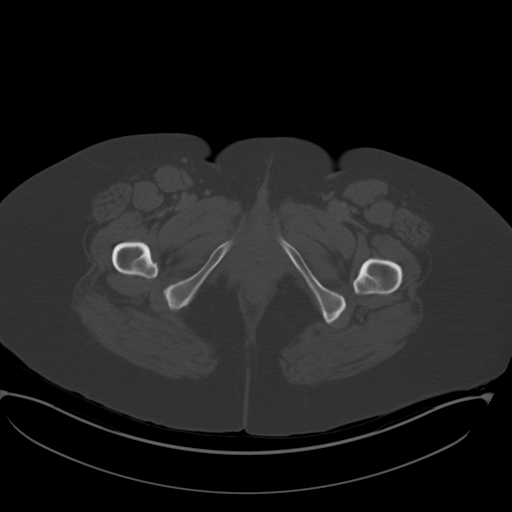
[im 12/98  soft-tissue]
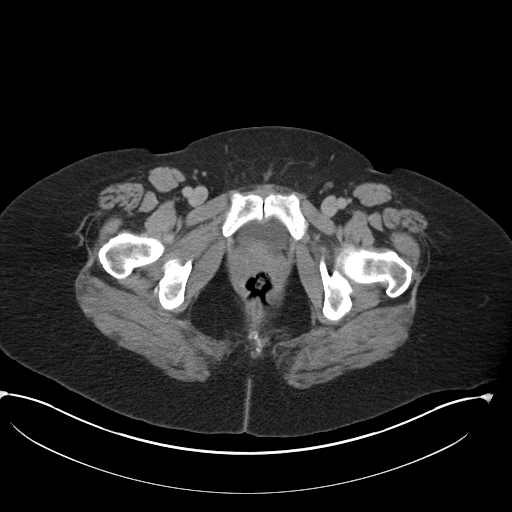
[im 23/98  soft-tissue]
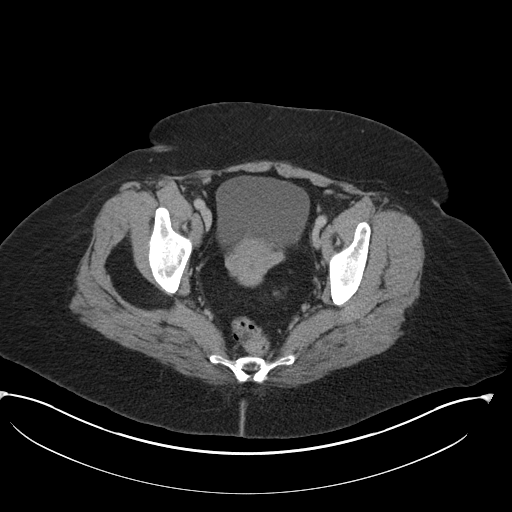
[im 29/98  soft-tissue]
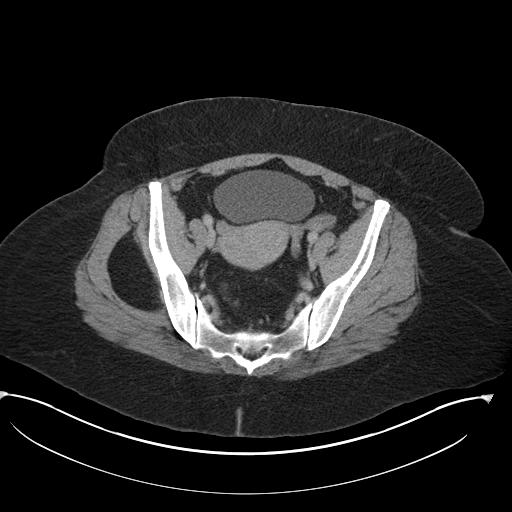
[im 35/98  soft-tissue]
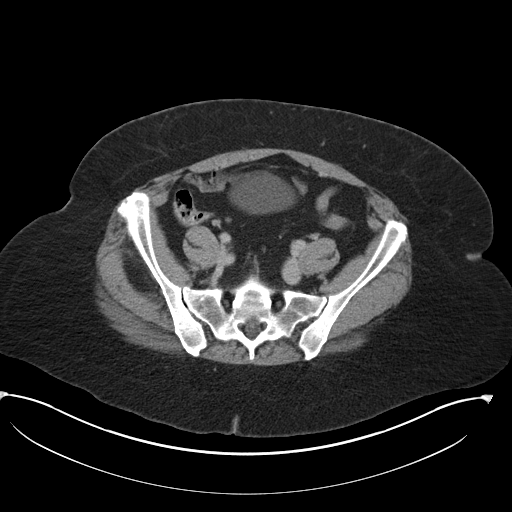
[im 40/98  soft-tissue]
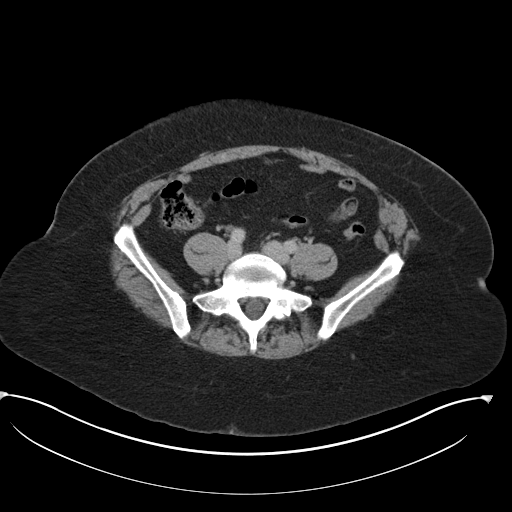
[im 52/98  soft-tissue]
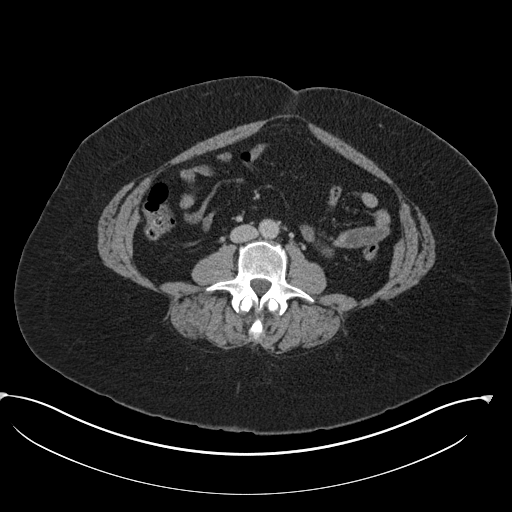
[im 58/98  soft-tissue]
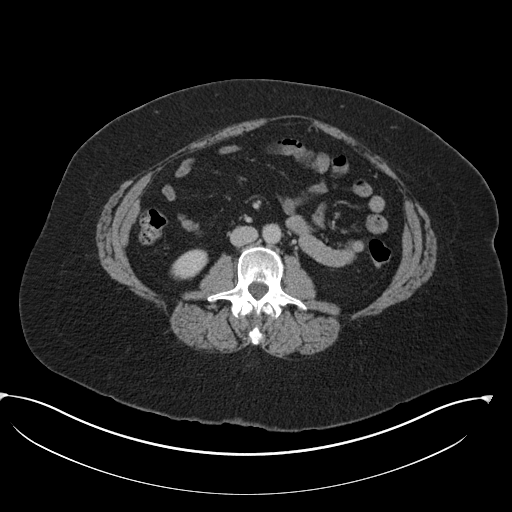
[im 63/98  soft-tissue]
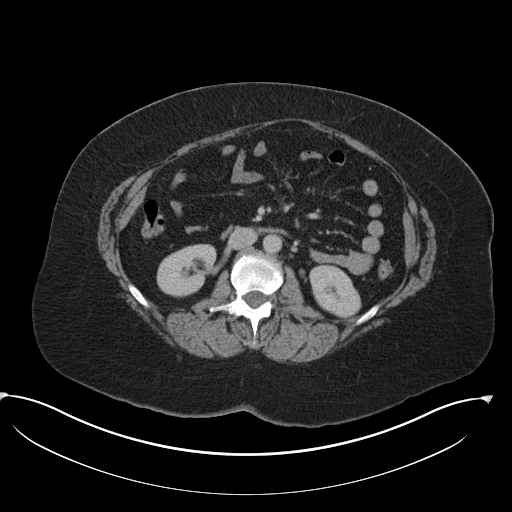
[im 63/98  bone]
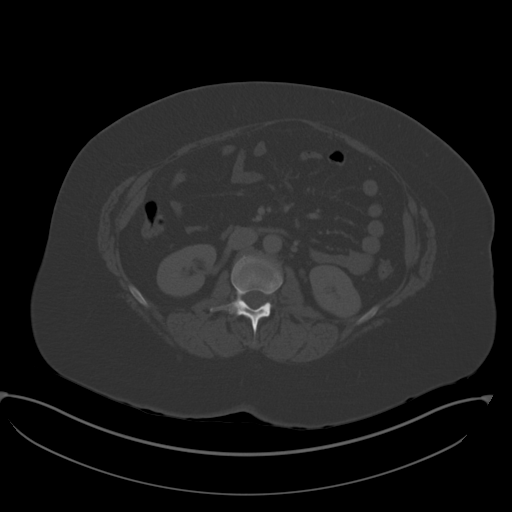
[im 69/98  soft-tissue]
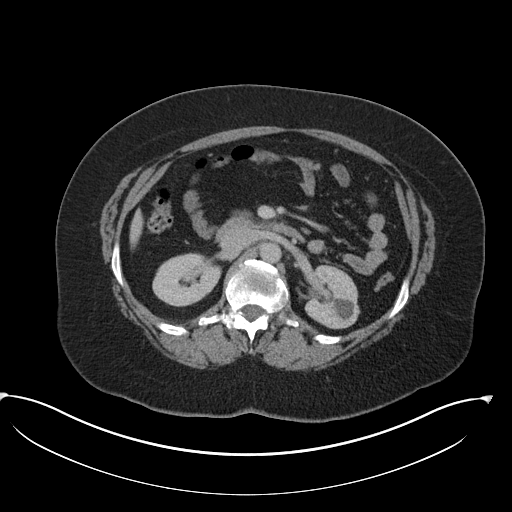
[im 75/98  soft-tissue]
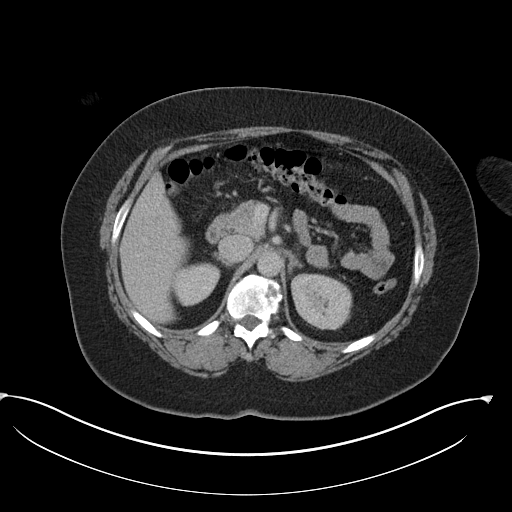
[im 86/98  soft-tissue]
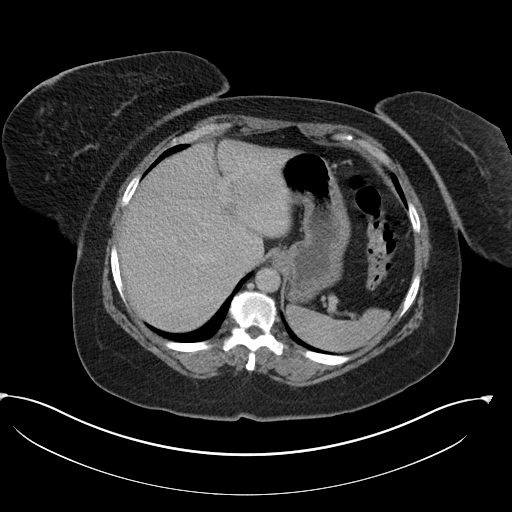
[im 92/98  soft-tissue]
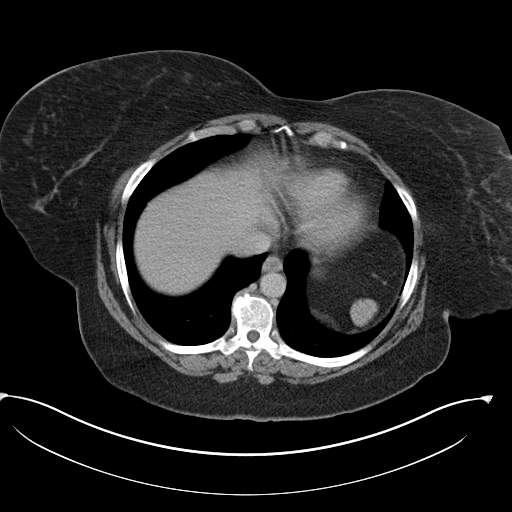

[Series 5: coronal st · coronal · 0.77mm/px · 3 of 151 slices shown]
[im 51/151  soft-tissue]
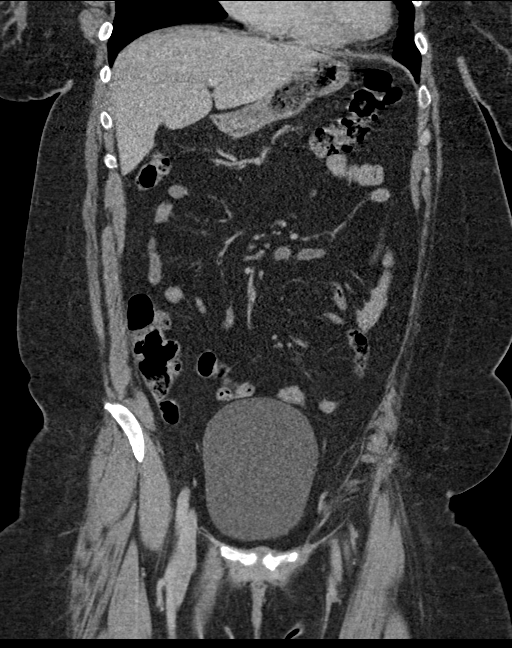
[im 67/151  soft-tissue]
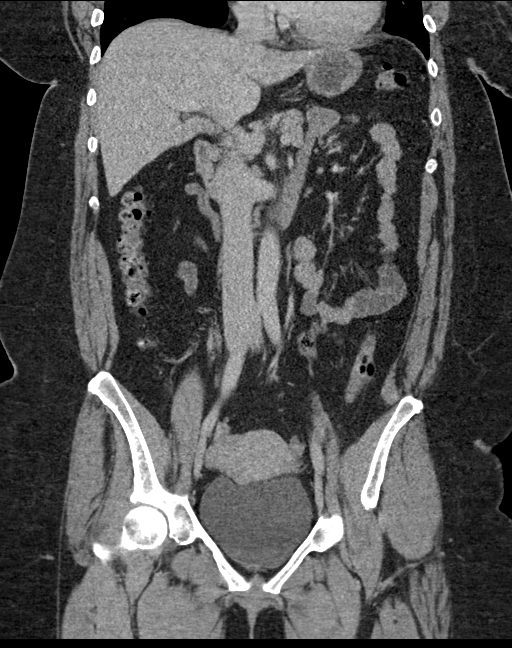
[im 84/151  soft-tissue]
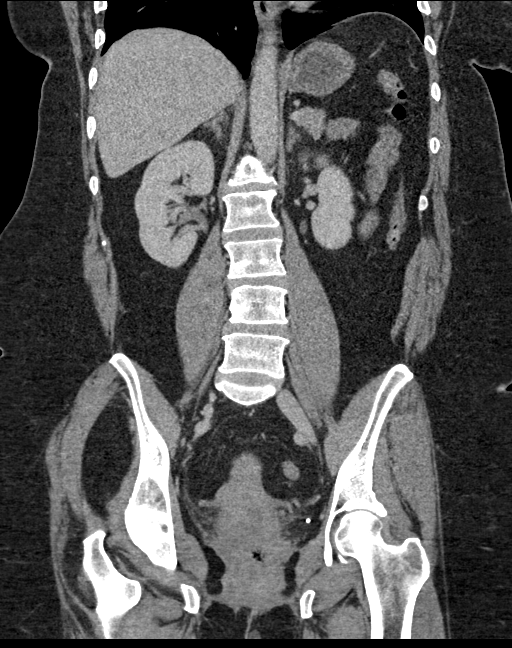

[16 of 46 positions shown; findings below may reference images not displayed]

FINDINGS: Lower chest: No acute abnormality.

Hepatobiliary: No suspicious hepatic lesion. Gallbladder surgically
absent. No biliary ductal dilation.

Pancreas: Within normal limits

Spleen: Within normal limits

Adrenals/Urinary Tract: Bilateral adrenal glands are unremarkable.
No hydronephrosis. 2 cm left interpolar renal cyst. No solid
enhancing renal masses. Urinary bladder is unremarkable for degree
of distension.

Stomach/Bowel: Stomach is unremarkable. No pathologic dilation of
small bowel. The appendix and terminal ileum appear normal. No
suspicious colonic wall thickening or mass like lesions. No evidence
of acute bowel inflammation.

Vascular/Lymphatic: No significant vascular findings are present. No
enlarged abdominal or pelvic lymph nodes.

Reproductive: Unremarkable CT appearance of the uterus, better
evaluated on recent pelvic ultrasound August 19, 2020.

Other: No abdominopelvic ascites.

Musculoskeletal: Large bridging osteophyte at T12-L1, within these 2
vertebral bodies there is a nonaggressive appearing lucent lesion
with narrow zone of transition which traverses the osteophyte into
both vertebral bodies with some thickened internal trabeculations,
similar in appearance dating back to July 26, 2005 and likely
representing a post traumatic pseudoarthrosis or atypical vertebral
hemangioma. Large intramuscular hematoma in the right gluteal
musculature on image 71/2.
IMPRESSION: No acute findings in the abdomen or pelvis.

## 2022-06-05 ENCOUNTER — Encounter: Payer: Self-pay | Admitting: Family

## 2022-06-06 ENCOUNTER — Telehealth: Payer: Commercial Managed Care - PPO | Admitting: Nurse Practitioner

## 2022-06-06 DIAGNOSIS — N898 Other specified noninflammatory disorders of vagina: Secondary | ICD-10-CM

## 2022-06-06 MED ORDER — FLUCONAZOLE 150 MG PO TABS: 150.0000 mg | ORAL_TABLET | Freq: Once | ORAL | 0 refills | Status: AC

## 2022-06-06 NOTE — Telephone Encounter (Signed)
Schedule appointment?

## 2022-06-06 NOTE — Telephone Encounter (Signed)
Please schedule appointment with Amy 

## 2022-06-06 NOTE — Progress Notes (Signed)
E-Visit for Vaginal Symptoms  We are sorry that you are not feeling well. Here is how we plan to help! Based on what you shared with me it looks like you: May have a yeast vaginosis Your treatment plan is A single Diflucan (fluconazole) 150mg  tablet once.  I have electronically sent this prescription into the pharmacy that you have chosen.  These symptoms may be related to hormonal changes associated with menopause, we would recommend follow up with your primary care or OBGYN to discuss this more.   Vaginosis is an inflammation of the vagina that can result in discharge, itching and pain. The cause is usually a change in the normal balance of vaginal bacteria or an infection. Vaginosis can also result from reduced estrogen levels after menopause.  The most common causes of vaginosis are:   Bacterial vaginosis which results from an overgrowth of one on several organisms that are normally present in your vagina.   Yeast infections which are caused by a naturally occurring fungus called candida.   Vaginal atrophy (atrophic vaginosis) which results from the thinning of the vagina from reduced estrogen levels after menopause.   Trichomoniasis which is caused by a parasite and is commonly transmitted by sexual intercourse.  Factors that increase your risk of developing vaginosis include: Medications, such as antibiotics and steroids Uncontrolled diabetes Use of hygiene products such as bubble bath, vaginal spray or vaginal deodorant Douching Wearing damp or tight-fitting clothing Using an intrauterine device (IUD) for birth control Hormonal changes, such as those associated with pregnancy, birth control pills or menopause Sexual activity Having a sexually transmitted infection  Your treatment plan is A single Diflucan (fluconazole) 150mg  tablet once.  I have electronically sent this prescription into the pharmacy that you have chosen.  Be sure to take all of the medication as directed.  Stop taking any medication if you develop a rash, tongue swelling or shortness of breath. Mothers who are breast feeding should consider pumping and discarding their breast milk while on these antibiotics. However, there is no consensus that infant exposure at these doses would be harmful.  Remember that medication creams can weaken latex condoms. Marland Kitchen   HOME CARE:  Good hygiene may prevent some types of vaginosis from recurring and may relieve some symptoms:  Avoid baths, hot tubs and whirlpool spas. Rinse soap from your outer genital area after a shower, and dry the area well to prevent irritation. Don't use scented or harsh soaps, such as those with deodorant or antibacterial action. Avoid irritants. These include scented tampons and pads. Wipe from front to back after using the toilet. Doing so avoids spreading fecal bacteria to your vagina.  Other things that may help prevent vaginosis include:  Don't douche. Your vagina doesn't require cleansing other than normal bathing. Repetitive douching disrupts the normal organisms that reside in the vagina and can actually increase your risk of vaginal infection. Douching won't clear up a vaginal infection. Use a latex condom. Both female and female latex condoms may help you avoid infections spread by sexual contact. Wear cotton underwear. Also wear pantyhose with a cotton crotch. If you feel comfortable without it, skip wearing underwear to bed. Yeast thrives in [employer] Your symptoms should improve in the next day or two.  GET HELP RIGHT AWAY IF:  You have pain in your lower abdomen ( pelvic area or over your ovaries) You develop nausea or vomiting You develop a fever Your discharge changes or worsens You have persistent pain with intercourse  You develop shortness of breath, a rapid pulse, or you faint.  These symptoms could be signs of problems or infections that need to be evaluated by a medical provider now.  MAKE SURE YOU    Understand these instructions. Will watch your condition. Will get help right away if you are not doing well or get worse.  Thank you for choosing an e-visit.  Your e-visit answers were reviewed by a board certified advanced clinical practitioner to complete your personal care plan. Depending upon the condition, your plan could have included both over the counter or prescription medications.  Please review your pharmacy choice. Make sure the pharmacy is open so you can pick up prescription now. If there is a problem, you may contact your provider through [employer] and have the prescription routed to another pharmacy.  Your safety is important to [place]. If you have drug allergies check your prescription carefully.   For the next 24 hours you can use MyChart to ask questions about today's visit, request a non-urgent call back, or ask for a work or school excuse. You will get an email in the next two days asking about your experience. I hope that your e-visit has been valuable and will speed your recovery.   Meds ordered this encounter  Medications   fluconazole (DIFLUCAN) 150 MG tablet    Sig: Take 1 tablet (150 mg total) by mouth once for 1 dose.    Dispense:  1 tablet    Refill:  0    I spent approximately 5 minutes reviewing the patient's history, current symptoms and coordinating their care today.

## 2022-06-07 NOTE — Progress Notes (Signed)
Patient ID: Kristine Kelly, female    DOB: 19-Sep-1970  MRN: [id number]  CC: Follow-Up  Subjective: Kristine Kelly is a 52 y.o. female who presents for follow-up.   Her concerns today include:  Patient message 06/05/2022: I back to experiencing that dry itching feeling again and I'm not that sexually act cause most time I'm not in the mood going through ups and down feelings. My question to you is this due to menopause? My breast be sore at times is that a symptom also.. I tried the cream and its ease it but it's not working again I may need the pill you prescribed last time. It s been since last Friday , I used that cream but no good results yet. Can you please reach back out to me to help me.   Today's visit 06/11/2022: Patient's report today consistent with patient message sent on 06/05/2022. Reports partial hysterectomy 2 years ago. Reports she still has ovaries. Reports prior to partial hysterectomy she was told that she was premenopausal. Since then reports she was medically cleared from Gynecology care. She denies abnormal vaginal bleeding and additional associated red flag symptoms. Reports bilateral breast soreness with no additional symptoms. She is due for mammogram. No further issues/concerns for discussion today.   Patient Active Problem List   Diagnosis Date Noted   Post-operative state 11/21/2020   H/O vaginal hysterectomy 11/21/2020   Vaginal discharge 10/05/2020   Menopausal symptoms 09/01/2020   Dermatophytosis 05/25/2020   Obesity 04/28/2018   Anxiety and depression 10/04/2016     Current Outpatient Medications on File Prior to Visit  Medication Sig Dispense Refill   acetaminophen (TYLENOL) 500 MG tablet Take 1 tablet (500 mg total) by mouth every 6 (six) hours as needed. 30 tablet 1   hydrOXYzine (ATARAX/VISTARIL) 25 MG tablet Take 1 tablet (25 mg total) by mouth every 6 (six) hours as needed for anxiety. 30 tablet 0   ibuprofen (ADVIL) 800 MG tablet Take 1 tablet (800  mg total) by mouth 3 (three) times daily. 30 tablet 0   meloxicam (MOBIC) 15 MG tablet Take 1 tablet (15 mg total) by mouth daily. 30 tablet 0   metroNIDAZOLE (METROGEL) 0.75 % vaginal gel Place 1 Applicatorful vaginally at bedtime. Apply one applicatorful to vagina at bedtime for 5 days 70 g 1   omeprazole (PRILOSEC) 20 MG capsule Take 20 mg by mouth as needed.     No current facility-administered medications on file prior to visit.    Allergies  Allergen Reactions   Aspirin Other (See Comments)    HEADACHE, DIZZINESS   Atenolol Other (See Comments)    HEADACHES DIZZINESS    Social History   Socioeconomic History   Marital status: Single    Spouse name: Not on file   Number of children: Not on file   Years of education: Not on file   Highest education level: Not on file  Occupational History   Not on file  Tobacco Use   Smoking status: Never   Smokeless tobacco: Never  Vaping Use   Vaping Use: Never used  Substance and Sexual Activity   Alcohol use: No   Drug use: No   Sexual activity: Yes    Birth control/protection: Surgical  Other Topics Concern   Not on file  Social History Narrative   Not on file   Social Determinants of Health   Financial Resource Strain: Not on file  Food Insecurity: No Food Insecurity (04/28/2018)   Hunger  Vital Sign    Worried About Programme researcher, broadcasting/film/video in the Last Year: Never true    Ran Out of Food in the Last Year: Never true  Transportation Needs: No Transportation Needs (04/28/2018)   PRAPARE - Administrator, Civil Service (Medical): No    Lack of Transportation (Non-Medical): No  Physical Activity: Not on file  Stress: Not on file  Social Connections: Not on file  Intimate Partner Violence: Not on file    Family History  Problem Relation Age of Onset   Asthma Mother     Past Surgical History:  Procedure Laterality Date   BREAST LUMPECTOMY Right 1991   BENIGN   CHOLECYSTECTOMY  2006   LAPAROSCOPIC   DILATATION  AND CURETTAGE/HYSTEROSCOPY WITH MINERVA N/A 07/30/2018   Procedure: DILATATION AND CURETTAGE/HYSTEROSCOPY WITH MINERVA;  Surgeon: Allie Bossier, MD;  Location: [facility];  Service: Gynecology;  Laterality: N/A;   TUBAL LIGATION     21 YRS AGO PER PT ON 10-04-2020   VAGINAL HYSTERECTOMY  10/11/2020   Procedure: TOTAL HYSTERECTOMY VAGINAL with RIGHT SALPINGECTOMY;  Surgeon: Hermina Staggers, MD;  Location: [facility];  Service: Gynecology;;    ROS: Review of Systems Negative except as stated above  PHYSICAL EXAM: BP 135/87 (BP Location: Right Arm, Patient Position: Sitting, Cuff Size: Large)   Pulse 82   Wt 273 lb (123.8 kg)   LMP 09/22/2020   SpO2 96%   BMI 42.75 kg/m   Physical Exam HENT:     Head: Normocephalic and atraumatic.  Eyes:     Extraocular Movements: Extraocular movements intact.     Conjunctiva/sclera: Conjunctivae normal.     Pupils: Pupils are equal, round, and reactive to light.  Cardiovascular:     Rate and Rhythm: Normal rate and regular rhythm.     Pulses: Normal pulses.     Heart sounds: Normal heart sounds.  Pulmonary:     Effort: Pulmonary effort is normal.     Breath sounds: Normal breath sounds.  Chest:     Comments: Patient declined. Genitourinary:    Comments: Patient declined. Musculoskeletal:     Cervical back: Normal range of motion and neck supple.  Neurological:     General: No focal deficit present.     Mental Status: She is alert and oriented to person, place, and time.  Psychiatric:        Mood and Affect: Mood normal.        Behavior: Behavior normal.    Results for orders placed or performed in visit on 06/11/22  POCT URINALYSIS DIP (CLINITEK)  Result Value Ref Range   Color, UA straw (A) yellow   Clarity, UA cloudy (A) clear   Glucose, UA negative negative mg/dL   Bilirubin, UA negative negative   Ketones, POC UA negative negative mg/dL   Spec Grav, UA >=5.784 (A) 1.010 - 1.025   Blood, UA small  (A) negative   pH, UA 6.0 5.0 - 8.0   POC PROTEIN,UA trace negative, trace   Urobilinogen, UA 0.2 0.2 or 1.0 E.U./dL   Nitrite, UA Negative Negative   Leukocytes, UA Negative Negative     ASSESSMENT AND PLAN: 1. History of partial hysterectomy 2. Vaginal itching 3. Vaginal dryness - No urinary tract infection.  - Routine screening.  - Referral to Gynecology for further evaluation/management.  - Cervicovaginal ancillary only - POCT URINALYSIS DIP (CLINITEK) - Follicle stimulating hormone - Luteinizing hormone - Ambulatory referral to  Gynecology  4. Routine screening for STI (sexually transmitted infection) - No urinary tract infection. - Routine screening.  - Cervicovaginal ancillary only - POCT URINALYSIS DIP (CLINITEK)  5. Encounter for screening for HIV - Routine screening.  - HIV antibody (with reflex)  6. Encounter for screening mammogram for malignant neoplasm of breast 7. Soreness breast - Referral for breast cancer screening by mammogram.  - Referral to Gynecology for further evaluation/management. - Ambulatory referral to Gynecology - MM Digital Screening; Future   Patient was given the opportunity to ask questions.  Patient verbalized understanding of the plan and was able to repeat key elements of the plan. Patient was given clear instructions to go to Emergency Department or return to medical center if symptoms don't improve, worsen, or new problems develop.The patient verbalized understanding.   Orders Placed This Encounter  Procedures   MM Digital Screening   HIV antibody (with reflex)   Follicle stimulating hormone   Luteinizing hormone   Ambulatory referral to Gynecology   POCT URINALYSIS DIP (CLINITEK)    Follow-up with primary provider as scheduled.  Rema Fendt, NP

## 2022-06-11 ENCOUNTER — Other Ambulatory Visit (HOSPITAL_COMMUNITY)
Admission: RE | Admit: 2022-06-11 | Discharge: 2022-06-11 | Disposition: A | Discharge status: Home / Self Care | Payer: Commercial Managed Care - PPO | Source: Ambulatory Visit | Attending: Family | Admitting: Family

## 2022-06-11 ENCOUNTER — Telehealth: Payer: Self-pay

## 2022-06-11 ENCOUNTER — Ambulatory Visit: Payer: Commercial Managed Care - PPO | Admitting: Family

## 2022-06-11 ENCOUNTER — Encounter: Payer: Self-pay | Admitting: Family

## 2022-06-11 VITALS — BP 135/87 | HR 82 | Wt 273.0 lb

## 2022-06-11 DIAGNOSIS — Z90711 Acquired absence of uterus with remaining cervical stump: Secondary | ICD-10-CM | POA: Diagnosis not present

## 2022-06-11 DIAGNOSIS — Z113 Encounter for screening for infections with a predominantly sexual mode of transmission: Secondary | ICD-10-CM

## 2022-06-11 DIAGNOSIS — N898 Other specified noninflammatory disorders of vagina: Secondary | ICD-10-CM | POA: Diagnosis present

## 2022-06-11 DIAGNOSIS — Z1231 Encounter for screening mammogram for malignant neoplasm of breast: Secondary | ICD-10-CM

## 2022-06-11 DIAGNOSIS — N644 Mastodynia: Secondary | ICD-10-CM | POA: Diagnosis not present

## 2022-06-11 DIAGNOSIS — Z114 Encounter for screening for human immunodeficiency virus [HIV]: Secondary | ICD-10-CM

## 2022-06-11 LAB — POCT URINALYSIS DIP (CLINITEK)
Bilirubin, UA: NEGATIVE
Glucose, UA: NEGATIVE mg/dL
Ketones, POC UA: NEGATIVE mg/dL
Leukocytes, UA: NEGATIVE
Nitrite, UA: NEGATIVE
Spec Grav, UA: >=1.03 — AB (ref 1.010–1.025)
Urobilinogen, UA: 0.2 E.U./dL
pH, UA: 6 (ref 5.0–8.0)

## 2022-06-11 NOTE — Telephone Encounter (Signed)
Pt was called and is aware of results, DOB was confirmed.  ?

## 2022-06-11 NOTE — Telephone Encounter (Signed)
-----   Message from Rema Fendt, NP sent at 06/11/2022  2:23 PM EDT ----- No urinary tract infection.

## 2022-06-12 ENCOUNTER — Other Ambulatory Visit: Payer: Self-pay | Admitting: Family

## 2022-06-12 DIAGNOSIS — B3731 Acute candidiasis of vulva and vagina: Secondary | ICD-10-CM

## 2022-06-12 LAB — CERVICOVAGINAL ANCILLARY ONLY
Bacterial Vaginitis (gardnerella): NEGATIVE
Candida Glabrata: NEGATIVE
Candida Vaginitis: POSITIVE — AB
Chlamydia: NEGATIVE
Comment: NEGATIVE
Comment: NEGATIVE
Comment: NEGATIVE
Comment: NEGATIVE
Comment: NEGATIVE
Comment: NORMAL
Neisseria Gonorrhea: NEGATIVE
Trichomonas: NEGATIVE

## 2022-06-12 MED ORDER — FLUCONAZOLE 150 MG PO TABS
150.0000 mg | ORAL_TABLET | ORAL | 0 refills | Status: AC
Start: 2022-06-12 — End: 2022-06-19

## 2022-06-13 ENCOUNTER — Telehealth: Payer: Self-pay

## 2022-06-13 NOTE — Telephone Encounter (Signed)
-----   Message from Rema Fendt, NP sent at 06/12/2022  4:15 PM EDT ----- - Gonorrhea, chlamydia, trichomonas, bacterial vaginitis negative. - Candida vaginitis (yeast infection) positive. Fluconazole prescribed.

## 2022-06-13 NOTE — Telephone Encounter (Signed)
Pt was called and vm was left, Information has been sent to nurse pool.   

## 2022-06-13 NOTE — Telephone Encounter (Signed)
-----   Message from Amy J Stephens, NP sent at 06/12/2022  4:15 PM EDT ----- - Gonorrhea, chlamydia, trichomonas, bacterial vaginitis negative. - Candida vaginitis (yeast infection) positive. Fluconazole prescribed.  

## 2022-06-14 ENCOUNTER — Other Ambulatory Visit: Payer: Self-pay

## 2022-06-15 LAB — FOLLICLE STIMULATING HORMONE: FSH: 22.6 m[IU]/mL

## 2022-06-15 LAB — HIV ANTIBODY (ROUTINE TESTING W REFLEX): HIV Screen 4th Generation wRfx: NONREACTIVE

## 2022-06-15 LAB — LUTEINIZING HORMONE: LH: 17.1 m[IU]/mL

## 2022-06-22 ENCOUNTER — Telehealth: Payer: Self-pay

## 2022-06-22 NOTE — Telephone Encounter (Signed)
Pt was called and no vm was left due to mailbox being full. Information has been sent to nurse pool.  

## 2022-06-22 NOTE — Telephone Encounter (Signed)
-----   Message from Rema Fendt, NP sent at 06/19/2022  4:16 PM EDT ----- Non reactive.

## 2022-06-27 NOTE — Progress Notes (Unsigned)
Patient ID: Kristine Kelly, female    DOB: 08/09/1970  MRN: [id number]  CC: Follow-Up  Subjective: Kristine Kelly is a 52 y.o. female who presents for follow-up.  Her concerns today include:  GYN Ortho  Handicap placard paperwork  Patient Active Problem List   Diagnosis Date Noted   Post-operative state 11/21/2020   H/O vaginal hysterectomy 11/21/2020   Vaginal discharge 10/05/2020   Menopausal symptoms 09/01/2020   Dermatophytosis 05/25/2020   Obesity 04/28/2018   Anxiety and depression 10/04/2016     Current Outpatient Medications on File Prior to Visit  Medication Sig Dispense Refill   acetaminophen (TYLENOL) 500 MG tablet Take 1 tablet (500 mg total) by mouth every 6 (six) hours as needed. 30 tablet 1   hydrOXYzine (ATARAX/VISTARIL) 25 MG tablet Take 1 tablet (25 mg total) by mouth every 6 (six) hours as needed for anxiety. 30 tablet 0   ibuprofen (ADVIL) 800 MG tablet Take 1 tablet (800 mg total) by mouth 3 (three) times daily. 30 tablet 0   meloxicam (MOBIC) 15 MG tablet Take 1 tablet (15 mg total) by mouth daily. 30 tablet 0   metroNIDAZOLE (METROGEL) 0.75 % vaginal gel Place 1 Applicatorful vaginally at bedtime. Apply one applicatorful to vagina at bedtime for 5 days 70 g 1   omeprazole (PRILOSEC) 20 MG capsule Take 20 mg by mouth as needed.     No current facility-administered medications on file prior to visit.    Allergies  Allergen Reactions   Aspirin Other (See Comments)    HEADACHE, DIZZINESS   Atenolol Other (See Comments)    HEADACHES DIZZINESS    Social History   Socioeconomic History   Marital status: Single    Spouse name: Not on file   Number of children: Not on file   Years of education: Not on file   Highest education level: Not on file  Occupational History   Not on file  Tobacco Use   Smoking status: Never   Smokeless tobacco: Never  Vaping Use   Vaping Use: Never used  Substance and Sexual Activity   Alcohol use: No   Drug use: No    Sexual activity: Yes    Birth control/protection: Surgical  Other Topics Concern   Not on file  Social History Narrative   Not on file   Social Determinants of Health   Financial Resource Strain: Not on file  Food Insecurity: No Food Insecurity (04/28/2018)   Hunger Vital Sign    Worried About Running Out of Food in the Last Year: Never true    Ran Out of Food in the Last Year: Never true  Transportation Needs: No Transportation Needs (04/28/2018)   PRAPARE - Administrator, Civil Service (Medical): No    Lack of Transportation (Non-Medical): No  Physical Activity: Not on file  Stress: Not on file  Social Connections: Not on file  Intimate Partner Violence: Not on file    Family History  Problem Relation Age of Onset   Asthma Mother     Past Surgical History:  Procedure Laterality Date   BREAST LUMPECTOMY Right 1991   BENIGN   CHOLECYSTECTOMY  2006   LAPAROSCOPIC   DILATATION AND CURETTAGE/HYSTEROSCOPY WITH MINERVA N/A 07/30/2018   Procedure: DILATATION AND CURETTAGE/HYSTEROSCOPY WITH MINERVA;  Surgeon: Allie Bossier, MD;  Location: [facility];  Service: Gynecology;  Laterality: N/A;   TUBAL LIGATION     21 YRS AGO PER PT ON 10-04-2020  VAGINAL HYSTERECTOMY  10/11/2020   Procedure: TOTAL HYSTERECTOMY VAGINAL with RIGHT SALPINGECTOMY;  Surgeon: Hermina Staggers, MD;  Location: [facility];  Service: Gynecology;;    ROS: Review of Systems Negative except as stated above  PHYSICAL EXAM: LMP 09/22/2020   Physical Exam  {female adult master:310786} {female adult master:310785}     Latest Ref Rng & Units 10/18/2021    3:45 PM 10/11/2020   11:14 PM 10/07/2020    3:02 PM  CMP  Glucose 70 - 99 mg/dL 79  161  77   BUN 6 - 24 mg/dL 13  8  12    Creatinine 0.57 - 1.00 mg/dL 0.96  0.45  4.09   Sodium 134 - 144 mmol/L 142  134  140   Potassium 3.5 - 5.2 mmol/L 4.1  4.0  4.1   Chloride 96 - 106 mmol/L 106  106  111   CO2 20 - 29  mmol/L 20  21  23    Calcium 8.7 - 10.2 mg/dL 9.4  8.7  9.4    Lipid Panel     Component Value Date/Time   CHOL 202 (H) 10/18/2021 1545   TRIG 155 (H) 10/18/2021 1545   HDL 52 10/18/2021 1545   CHOLHDL 3.9 10/18/2021 1545   CHOLHDL 4.2 Ratio 10/13/2008 2046   VLDL 25 10/13/2008 2046   LDLCALC 123 (H) 10/18/2021 1545    CBC    Component Value Date/Time   WBC 11.6 (H) 10/11/2020 2314   RBC 4.03 10/11/2020 2314   HGB 11.9 (L) 10/11/2020 2314   HGB 13.9 08/07/2020 0917   HCT 35.3 (L) 10/11/2020 2314   HCT 41.5 08/07/2020 0917   PLT 198 10/11/2020 2314   PLT 209 08/07/2020 0917   MCV 87.6 10/11/2020 2314   MCV 88 08/07/2020 0917   MCH 29.5 10/11/2020 2314   MCHC 33.7 10/11/2020 2314   RDW 13.2 10/11/2020 2314   RDW 13.1 08/07/2020 0917   LYMPHSABS 1.2 08/22/2020 0753   LYMPHSABS 2.3 05/10/2019 1628   MONOABS 0.5 08/22/2020 0753   EOSABS 0.0 08/22/2020 0753   EOSABS 0.0 05/10/2019 1628   BASOSABS 0.0 08/22/2020 0753   BASOSABS 0.0 05/10/2019 1628    ASSESSMENT AND PLAN:  There are no diagnoses linked to this encounter.   Patient was given the opportunity to ask questions.  Patient verbalized understanding of the plan and was able to repeat key elements of the plan. Patient was given clear instructions to go to Emergency Department or return to medical center if symptoms don't improve, worsen, or new problems develop.The patient verbalized understanding.   No orders of the defined types were placed in this encounter.    Requested Prescriptions    No prescriptions requested or ordered in this encounter    No follow-ups on file.  Rema Fendt, NP

## 2022-06-28 ENCOUNTER — Ambulatory Visit (INDEPENDENT_AMBULATORY_CARE_PROVIDER_SITE_OTHER): Payer: Commercial Managed Care - PPO | Admitting: Family

## 2022-06-28 ENCOUNTER — Encounter: Payer: Self-pay | Admitting: Family

## 2022-06-28 VITALS — BP 133/84 | HR 63 | Temp 97.6°F | Resp 16 | Wt 275.4 lb

## 2022-06-28 DIAGNOSIS — M2550 Pain in unspecified joint: Secondary | ICD-10-CM | POA: Diagnosis not present

## 2022-06-28 DIAGNOSIS — Z0289 Encounter for other administrative examinations: Secondary | ICD-10-CM

## 2022-06-28 DIAGNOSIS — M25562 Pain in left knee: Secondary | ICD-10-CM

## 2022-06-28 DIAGNOSIS — M25551 Pain in right hip: Secondary | ICD-10-CM

## 2022-06-28 DIAGNOSIS — G8929 Other chronic pain: Secondary | ICD-10-CM

## 2022-06-28 MED ORDER — TRIAMCINOLONE ACETONIDE 40 MG/ML IJ SUSP
40.0000 mg | Freq: Once | INTRAMUSCULAR | Status: AC
Start: 2022-06-28 — End: ?

## 2022-06-28 NOTE — Patient Instructions (Signed)
Please call [facility] for appointment. Address: [address] [place], [place] [address] Phone #: [phone].  Chronic Knee Pain, Adult Chronic knee pain is pain in one or both knees that lasts longer than 3 months. Symptoms of chronic knee pain may include swelling, stiffness, and discomfort. Age-related wear and tear (osteoarthritis) of the knee joint is the most common cause of chronic knee pain. Other possible causes include: A long-term immune-related disease that causes inflammation of the knee (rheumatoid arthritis). This usually affects both knees. Inflammatory arthritis, such as gout or pseudogout. An injury to the knee that causes arthritis. An injury to the knee that damages the ligaments. Ligaments are strong tissues that connect bones to each other. Runner's knee or pain behind the kneecap. Treatment for chronic knee pain depends on the cause. The main treatments for chronic knee pain are physical therapy and weight loss. This condition may also be treated with medicines, injections, a knee sleeve or brace, and by using crutches. Rest, ice, pressure (compression), and elevation, also known as RICE therapy, may also be recommended. Follow these instructions at home: If you have a knee sleeve or brace:  Wear the knee sleeve or brace as told by your health care provider. Remove it only as told by your health care provider. Loosen it if your toes tingle, become numb, or turn cold and blue. Keep it clean. If the sleeve or brace is not waterproof: Do not let it get wet. Remove it if allowed by your health care provider, or cover it with a watertight covering when you take a bath or a shower. Managing pain, stiffness, and swelling     If directed, apply heat to the affected area as often as told by your health care provider. Use the heat source that your health care provider recommends, such as a moist heat pack or a heating pad. If you have a removable knee sleeve  or brace, remove it as told by your health care provider. Place a towel between your skin and the heat source. Leave the heat on for 20-30 minutes. Remove the heat if your skin turns bright red. This is especially important if you are unable to feel pain, heat, or cold. You may have a greater risk of getting burned. If directed, put ice on the affected area. To do this: If you have a removable knee sleeve or brace, remove it as told by your health care provider. Put ice in a plastic bag. Place a towel between your skin and the bag. Leave the ice on for 20 minutes, 2-3 times a day. Remove the ice if your skin turns bright red. This is very important. If you cannot feel pain, heat, or cold, you have a greater risk of damage to the area. Move your toes often to reduce stiffness and swelling. Raise (elevate) the injured area above the level of your heart while you are sitting or lying down. Activity Avoid high-impact activities or exercises, such as running, jumping rope, or doing jumping jacks. Follow the exercise plan that your health care provider designed for you. Your health care provider may suggest that you: Avoid activities that make knee pain worse. This may require you to change your exercise routines, sport participation, or job duties. Wear shoes with cushioned soles. Avoid sports that require running and sudden changes in direction. Do physical therapy. Physical therapy is planned to match your needs and abilities. It may include exercises for strength, flexibility, stability, and endurance. Do  exercises that increase balance and strength, such as tai chi and yoga. Do not use the injured limb to support your body weight until your health care provider says that you can. Use crutches as told by your health care provider. Return to your normal activities as told by your health care provider. Ask your health care provider what activities are safe for you. General instructions Take  over-the-counter and prescription medicines only as told by your health care provider. Lose weight if you are overweight. Losing even a little weight can reduce knee pain. Ask your health care provider what your ideal weight is, and how to safely lose extra weight. A dietitian may be able to help you plan your meals. Do not use any products that contain nicotine or tobacco, such as cigarettes, e-cigarettes, and chewing tobacco. These can delay healing. If you need help quitting, ask your health care provider. Keep all follow-up visits. This is important. Contact a health care provider if: You have knee pain that is not getting better or gets worse. You are unable to do your physical therapy exercises due to knee pain. Get help right away if: Your knee swells and the swelling becomes worse. You cannot move your knee. You have severe knee pain. Summary Knee pain that lasts more than 3 months is considered chronic knee pain. The main treatments for chronic knee pain are physical therapy and weight loss. You may also need to take medicines, wear a knee sleeve or brace, use crutches, and apply ice or heat. Losing even a little weight can reduce knee pain. Ask your health care provider what your ideal weight is, and how to safely lose extra weight. A dietitian may be able to help you plan your meals. Follow the exercise plan that your health care provider designed for you. This information is not intended to replace advice given to you by your health care provider. Make sure you discuss any questions you have with your health care provider. Document Revised: 06/23/2019 Document Reviewed: 06/24/2019 Elsevier Patient Education  2024 [employer].

## 2022-07-06 ENCOUNTER — Other Ambulatory Visit: Payer: Self-pay | Admitting: Family

## 2022-07-06 DIAGNOSIS — Z90711 Acquired absence of uterus with remaining cervical stump: Secondary | ICD-10-CM

## 2022-07-06 DIAGNOSIS — Z1231 Encounter for screening mammogram for malignant neoplasm of breast: Secondary | ICD-10-CM

## 2022-07-06 DIAGNOSIS — N644 Mastodynia: Secondary | ICD-10-CM

## 2022-07-06 DIAGNOSIS — Z113 Encounter for screening for infections with a predominantly sexual mode of transmission: Secondary | ICD-10-CM

## 2022-07-06 DIAGNOSIS — N898 Other specified noninflammatory disorders of vagina: Secondary | ICD-10-CM

## 2022-07-06 DIAGNOSIS — Z114 Encounter for screening for human immunodeficiency virus [HIV]: Secondary | ICD-10-CM

## 2022-07-27 ENCOUNTER — Ambulatory Visit: Payer: Commercial Managed Care - PPO

## 2022-07-27 ENCOUNTER — Ambulatory Visit
Admission: RE | Admit: 2022-07-27 | Discharge: 2022-07-27 | Disposition: A | Discharge status: Home / Self Care | Payer: Commercial Managed Care - PPO | Source: Ambulatory Visit | Attending: Family | Admitting: Family

## 2022-07-27 DIAGNOSIS — N644 Mastodynia: Secondary | ICD-10-CM

## 2022-10-26 ENCOUNTER — Other Ambulatory Visit: Payer: Self-pay | Admitting: Family

## 2022-10-26 DIAGNOSIS — Z1212 Encounter for screening for malignant neoplasm of rectum: Secondary | ICD-10-CM

## 2022-10-26 DIAGNOSIS — Z1211 Encounter for screening for malignant neoplasm of colon: Secondary | ICD-10-CM
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
## 2022-11-12 ENCOUNTER — Ambulatory Visit: Payer: Commercial Managed Care - PPO | Attending: Internal Medicine | Admitting: Internal Medicine

## 2022-11-12 ENCOUNTER — Encounter: Payer: Self-pay | Admitting: Internal Medicine

## 2022-11-12 VITALS — BP 135/86 | HR 63 | Resp 14 | Ht 67.0 in | Wt 273.0 lb

## 2022-11-12 DIAGNOSIS — N951 Menopausal and female climacteric states: Secondary | ICD-10-CM | POA: Diagnosis not present

## 2022-11-12 DIAGNOSIS — R200 Anesthesia of skin: Secondary | ICD-10-CM

## 2022-11-12 DIAGNOSIS — M25551 Pain in right hip: Secondary | ICD-10-CM | POA: Diagnosis not present

## 2022-11-12 DIAGNOSIS — M150 Primary generalized (osteo)arthritis: Secondary | ICD-10-CM

## 2022-11-12 DIAGNOSIS — M159 Polyosteoarthritis, unspecified: Secondary | ICD-10-CM | POA: Insufficient documentation

## 2022-11-12 DIAGNOSIS — R202 Paresthesia of skin: Secondary | ICD-10-CM

## 2022-11-12 NOTE — Patient Instructions (Signed)

## 2022-11-14 LAB — CYCLIC CITRUL PEPTIDE ANTIBODY, IGG: Cyclic Citrullin Peptide Ab: <16 U

## 2022-11-14 LAB — RHEUMATOID FACTOR: Rheumatoid fact SerPl-aCnc: <10 [IU]/mL (ref <14)

## 2022-11-14 LAB — SEDIMENTATION RATE: Sed Rate: 33 mm/h — ABNORMAL HIGH (ref 0–30)

## 2022-11-27 ENCOUNTER — Telehealth: Payer: Self-pay | Admitting: Family

## 2022-11-27 NOTE — Telephone Encounter (Signed)
ERROR

## 2022-12-14 ENCOUNTER — Encounter: Payer: Self-pay | Admitting: Family

## 2022-12-14 ENCOUNTER — Ambulatory Visit (INDEPENDENT_AMBULATORY_CARE_PROVIDER_SITE_OTHER): Payer: Medicaid Other | Admitting: Family

## 2022-12-14 VITALS — BP 144/92 | HR 81 | Temp 98.1°F | Resp 16 | Wt 273.8 lb

## 2022-12-14 DIAGNOSIS — G8929 Other chronic pain: Secondary | ICD-10-CM

## 2022-12-14 DIAGNOSIS — R030 Elevated blood-pressure reading, without diagnosis of hypertension: Secondary | ICD-10-CM

## 2022-12-14 DIAGNOSIS — M25551 Pain in right hip: Secondary | ICD-10-CM | POA: Diagnosis not present

## 2022-12-14 DIAGNOSIS — M546 Pain in thoracic spine: Secondary | ICD-10-CM

## 2022-12-14 DIAGNOSIS — M25562 Pain in left knee: Secondary | ICD-10-CM

## 2022-12-14 DIAGNOSIS — Z0289 Encounter for other administrative examinations: Secondary | ICD-10-CM

## 2022-12-14 MED ORDER — GABAPENTIN 300 MG PO CAPS
300.0000 mg | ORAL_CAPSULE | Freq: Every evening | ORAL | 1 refills | Status: DC | PRN
Start: 2022-12-14 — End: 2023-02-08

## 2022-12-14 NOTE — Progress Notes (Signed)
Patient ID: Kristine Kelly, female    DOB: 05/22/1970  MRN: [id number]  CC: Referral   Subjective: Kristine Kelly is a 52 y.o. female who presents for referral.   Her concerns today include:  - Reports mid-back pain radiating to right side began suddenly 3 weeks ago after picking up sodas at work. She denies recent trauma/injury and red flag symptoms. Reports recently seen at [facility] for the same. Reports she was prescribed muscle relaxer which did not help. States she later scheduled her own MRI with [facility] and states she never received results. Reports in the past she was seen by Orthopedics for left knee and right hip pain. States imaging was done and they never called her with results.  - Needs handicap placard updated.  Patient Active Problem List   Diagnosis Date Noted   Osteoarthritis, multiple sites 11/12/2022   Pain in right hip 11/12/2022   Numbness and tingling 11/12/2022   Post-operative state 11/21/2020   H/O vaginal hysterectomy 11/21/2020   Vaginal discharge 10/05/2020   Menopausal symptoms 09/01/2020   Dermatophytosis 05/25/2020   Obesity 04/28/2018   Anxiety and depression 10/04/2016     Current Outpatient Medications on File Prior to Visit  Medication Sig Dispense Refill   acetaminophen (TYLENOL) 500 MG tablet Take 1 tablet (500 mg total) by mouth every 6 (six) hours as needed. 30 tablet 1   cyclobenzaprine (FLEXERIL) 10 MG tablet Take by mouth.     hydrOXYzine (ATARAX/VISTARIL) 25 MG tablet Take 1 tablet (25 mg total) by mouth every 6 (six) hours as needed for anxiety. 30 tablet 0   ibuprofen (ADVIL) 800 MG tablet Take 1 tablet (800 mg total) by mouth 3 (three) times daily. 30 tablet 0   meloxicam (MOBIC) 15 MG tablet Take 1 tablet (15 mg total) by mouth daily. 30 tablet 0   metroNIDAZOLE (METROGEL) 0.75 % vaginal gel Place 1 Applicatorful vaginally at bedtime. Apply one applicatorful to vagina at bedtime for 5 days 70 g 1   omeprazole (PRILOSEC)  20 MG capsule Take 20 mg by mouth as needed.     Current Facility-Administered Medications on File Prior to Visit  Medication Dose Route Frequency Provider Last Rate Last Admin   triamcinolone acetonide (KENALOG-40) injection 40 mg  40 mg Intramuscular Once Rema Fendt, NP        Allergies  Allergen Reactions   Aspirin Other (See Comments)    HEADACHE, DIZZINESS   Atenolol Other (See Comments)    HEADACHES DIZZINESS    Social History   Socioeconomic History   Marital status: Single    Spouse name: Not on file   Number of children: Not on file   Years of education: Not on file   Highest education level: Not on file  Occupational History   Not on file  Tobacco Use   Smoking status: Never    Passive exposure: Never   Smokeless tobacco: Never  Vaping Use   Vaping status: Never Used  Substance and Sexual Activity   Alcohol use: No   Drug use: No   Sexual activity: Yes    Birth control/protection: Surgical  Other Topics Concern   Not on file  Social History Narrative   Not on file   Social Determinants of Health   Financial Resource Strain: Not on file  Food Insecurity: No Food Insecurity (04/28/2018)   Hunger Vital Sign    Worried About Running Out of Food in the Last Year: Never  true    Ran Out of Food in the Last Year: Never true  Transportation Needs: No Transportation Needs (04/28/2018)   PRAPARE - Administrator, Civil Service (Medical): No    Lack of Transportation (Non-Medical): No  Physical Activity: Not on file  Stress: Not on file  Social Connections: Not on file  Intimate Partner Violence: Not on file    Family History  Problem Relation Age of Onset   Asthma Mother     Past Surgical History:  Procedure Laterality Date   BREAST LUMPECTOMY Right 1991   BENIGN   CHOLECYSTECTOMY  2006   LAPAROSCOPIC   DILATATION AND CURETTAGE/HYSTEROSCOPY WITH MINERVA N/A 07/30/2018   Procedure: DILATATION AND CURETTAGE/HYSTEROSCOPY WITH MINERVA;   Surgeon: Allie Bossier, MD;  Location: [facility];  Service: Gynecology;  Laterality: N/A;   TUBAL LIGATION     21 YRS AGO PER PT ON 10-04-2020   VAGINAL HYSTERECTOMY  10/11/2020   Procedure: TOTAL HYSTERECTOMY VAGINAL with RIGHT SALPINGECTOMY;  Surgeon: Hermina Staggers, MD;  Location: [facility];  Service: Gynecology;;    ROS: Review of Systems Negative except as stated above  PHYSICAL EXAM: BP (!) 144/92   Pulse 81   Temp 98.1 F (36.7 C) (Oral)   Resp 16   Wt 273 lb 12.8 oz (124.2 kg)   LMP 09/22/2020   SpO2 97%   BMI 42.88 kg/m   Physical Exam HENT:     Head: Normocephalic and atraumatic.     Nose: Nose normal.     Mouth/Throat:     Mouth: Mucous membranes are moist.     Pharynx: Oropharynx is clear.  Eyes:     Extraocular Movements: Extraocular movements intact.     Conjunctiva/sclera: Conjunctivae normal.     Pupils: Pupils are equal, round, and reactive to light.  Cardiovascular:     Rate and Rhythm: Normal rate and regular rhythm.     Pulses: Normal pulses.     Heart sounds: Normal heart sounds.  Pulmonary:     Effort: Pulmonary effort is normal.     Breath sounds: Normal breath sounds.  Musculoskeletal:        General: Normal range of motion.     Right shoulder: Normal.     Left shoulder: Normal.     Right upper arm: Normal.     Left upper arm: Normal.     Right elbow: Normal.     Left elbow: Normal.     Right forearm: Normal.     Left forearm: Normal.     Right wrist: Normal.     Left wrist: Normal.     Right hand: Normal.     Left hand: Normal.     Cervical back: Normal, normal range of motion and neck supple.     Thoracic back: Tenderness present.     Lumbar back: Normal.     Right hip: Normal.     Left hip: Normal.     Right upper leg: Normal.     Left upper leg: Normal.     Right knee: Normal.     Left knee: Normal.     Right lower leg: Normal.     Left lower leg: Normal.     Right ankle: Normal.     Left  ankle: Normal.     Right foot: Normal.     Left foot: Normal.  Neurological:     General: No focal deficit present.  Mental Status: She is alert and oriented to person, place, and time.  Psychiatric:        Mood and Affect: Mood normal.        Behavior: Behavior normal.     ASSESSMENT AND PLAN: 1. Midline thoracic back pain, unspecified chronicity 2. Chronic pain of left knee 3. Pain in right hip - Gabapentin as prescribed. Counseled on medication adherence/adverse effects. - Referral to Orthopedic Surgery for evaluation/management.  - Follow-up with primary provider as scheduled.  - Ambulatory referral to Orthopedic Surgery - gabapentin (NEURONTIN) 300 MG capsule; Take 1 capsule (300 mg total) by mouth at bedtime as needed.  Dispense: 30 capsule; Refill: 1  4. Encounter for completion of form with patient - Handicap placard completed.   5. Elevated blood pressure reading - Blood pressure not at goal during today's visit. Patient asymptomatic without chest pressure, chest pain, palpitations, shortness of breath, worst headache of life, and any additional red flag symptoms. - Follow-up with primary provider in 2 weeks or sooner if needed.    Patient was given the opportunity to ask questions.  Patient verbalized understanding of the plan and was able to repeat key elements of the plan. Patient was given clear instructions to go to Emergency Department or return to medical center if symptoms don't improve, worsen, or new problems develop.The patient verbalized understanding.   Orders Placed This Encounter  Procedures   Ambulatory referral to Orthopedic Surgery     Requested Prescriptions   Signed Prescriptions Disp Refills   gabapentin (NEURONTIN) 300 MG capsule 30 capsule 1    Sig: Take 1 capsule (300 mg total) by mouth at bedtime as needed.    Return in about 2 weeks (around 12/28/2022) for Follow-Up or next available blood pressure check.  Rema Fendt, NP

## 2022-12-25 ENCOUNTER — Telehealth: Payer: Self-pay | Admitting: Physical Medicine and Rehabilitation

## 2022-12-25 ENCOUNTER — Encounter: Payer: Self-pay | Admitting: Physician Assistant

## 2022-12-25 ENCOUNTER — Ambulatory Visit (INDEPENDENT_AMBULATORY_CARE_PROVIDER_SITE_OTHER): Payer: Commercial Managed Care - PPO | Admitting: Physician Assistant

## 2022-12-25 DIAGNOSIS — M546 Pain in thoracic spine: Secondary | ICD-10-CM | POA: Diagnosis not present

## 2022-12-25 NOTE — Progress Notes (Signed)
Office Visit Note   Patient: Kristine Kelly           Date of Birth: [birth year]           MRN: [id number] Visit Date: 12/25/2022              Requested by: Rema Fendt, NP [address] [place],  [place] [address] PCP: Rema Fendt, NP   Assessment & Plan: Visit Diagnoses: Thoracic back pain  Plan: Pleasant 52 year old woman with 3 to 4 weeks week history of thoracic back pain.  She said this occurred after lifting a very heavy item at work.  Since then she had mid thoracic back pain no radicular findings.  [facility] at T12 as well as a herniation at T6-T7.  I recommended physical therapy.  Will have her follow-up in 3 weeks with Ellin Goodie I told her she needed to get copies of the actual MRI and x-rays for review  Follow-Up Instructions: 3 weeks  Orders:  No orders of the defined types were placed in this encounter.  No orders of the defined types were placed in this encounter.     Procedures: No procedures performed   Clinical Data: No additional findings.   Subjective: No chief complaint on file.   HPI 52 year old woman with 3 to 4-week history of mid thoracic back pain after lifting a large heavy item at work.  No paresthesias no weakness no shortness of breath Review of Systems  All other systems reviewed and are negative.    Objective: Vital Signs: LMP 09/22/2020   Physical Exam Constitutional:      Appearance: Normal appearance.  Pulmonary:     Effort: Pulmonary effort is normal.  Skin:    General: Skin is warm and dry.  Neurological:     General: No focal deficit present.     Mental Status: She is alert and oriented to person, place, and time.  Psychiatric:        Mood and Affect: Mood normal.        Behavior: Behavior normal.     Ortho Exam Examination of her low back she has some mid thoracic tenderness but no crepitus no step-offs upper  extremity strength and sensation is intact.  No lower extremity weakness she is overall neurovascularly intact she has good respiratory excursion without dyspnea or shortness of breath Specialty Comments:  No specialty comments available.  Imaging: No results found.   PMFS History: Patient Active Problem List   Diagnosis Date Noted   Osteoarthritis, multiple sites 11/12/2022   Pain in right hip 11/12/2022   Numbness and tingling 11/12/2022   Post-operative state 11/21/2020   H/O vaginal hysterectomy 11/21/2020   Vaginal discharge 10/05/2020   Menopausal symptoms 09/01/2020   Dermatophytosis 05/25/2020   Obesity 04/28/2018   Anxiety and depression 10/04/2016   Past Medical History:  Diagnosis Date   Anxiety    NO RECENT ANXIETY ATTACKS PER PT ON 10-04-2020   Arthritis    OA LEFT KNEE   Claustrophobia 10/04/2020   COVID-19 2020   COUGH SOB X 6 WEEKS ALL SYMPTOMS RESOLVED   GERD (gastroesophageal reflux disease)    Headache    Hypertension    not currently on medications   Wears glasses 10/04/2020    Family History  Problem Relation Age of Onset   Asthma Mother     Past Surgical History:  Procedure Laterality  Date   BREAST LUMPECTOMY Right 1991   BENIGN   CHOLECYSTECTOMY  2006   LAPAROSCOPIC   DILATATION AND CURETTAGE/HYSTEROSCOPY WITH MINERVA N/A 07/30/2018   Procedure: DILATATION AND CURETTAGE/HYSTEROSCOPY WITH MINERVA;  Surgeon: Allie Bossier, MD;  Location: [facility];  Service: Gynecology;  Laterality: N/A;   TUBAL LIGATION     21 YRS AGO PER PT ON 10-04-2020   VAGINAL HYSTERECTOMY  10/11/2020   Procedure: TOTAL HYSTERECTOMY VAGINAL with RIGHT SALPINGECTOMY;  Surgeon: Hermina Staggers, MD;  Location[facility];  Service: Gynecology;;   Social History   Occupational History   Not on file  Tobacco Use   Smoking status: Never    Passive exposure: Never   Smokeless tobacco: Never  Vaping Use   Vaping status: Never Used   Substance and Sexual Activity   Alcohol use: No   Drug use: No   Sexual activity: Yes    Birth control/protection: Surgical

## 2022-12-25 NOTE — Telephone Encounter (Signed)
Patient needed to schedule for back pain coming from [facility]. XB#[phone]

## 2022-12-27 ENCOUNTER — Telehealth: Payer: Self-pay | Admitting: Physician Assistant

## 2022-12-27 NOTE — Telephone Encounter (Signed)
Pt came in to drop off MRI given to provider

## 2022-12-31 ENCOUNTER — Ambulatory Visit: Payer: Commercial Managed Care - PPO | Admitting: Physical Medicine and Rehabilitation

## 2022-12-31 ENCOUNTER — Encounter: Payer: Self-pay | Admitting: Internal Medicine

## 2022-12-31 ENCOUNTER — Encounter: Payer: Self-pay | Admitting: Physical Medicine and Rehabilitation

## 2022-12-31 DIAGNOSIS — M546 Pain in thoracic spine: Secondary | ICD-10-CM | POA: Diagnosis not present

## 2022-12-31 DIAGNOSIS — M7918 Myalgia, other site: Secondary | ICD-10-CM

## 2022-12-31 MED ORDER — MELOXICAM 15 MG PO TABS: 15.0000 mg | ORAL_TABLET | Freq: Every day | ORAL | 0 refills | Status: DC

## 2022-12-31 NOTE — Telephone Encounter (Signed)
Patient was seen on 11/12/2022 for a new patient and had labs drawn. Please advise. No follow up has been scheduled.

## 2022-12-31 NOTE — Progress Notes (Signed)
Kristine Kelly - 52 y.o. female MRN [id number]  Date of birth: January 11, 1971  Office Visit Note: Visit Date: 12/31/2022 PCP: Rema Fendt, NP Referred by: Rema Fendt, NP  Subjective: Chief Complaint  Patient presents with   Middle Back - Pain   HPI: Kristine Kelly is a 52 y.o. female who comes in today per the request of [place] Persons, PA for evaluation of acute right sided thoracic back pain radiating around to ribs. Pain ongoing for 1 month after lifting heavy box of sodas. Her pain worsens with movement and activity. Bending and lifting seems to cause the most pain. She describes her pain as sore and aching sensation, currently rates as 7 out of 10. Some relief of pain with home exercise regimen, rest and use of medications. She is scheduled to start formal physical therapy on 01/07/2023 at [facility]. Recent thoracic MRI imaging with [facility] shows moderate right paracentral disc protrusion with inferior migration of disc material contacting ventral cervical spinal cord. No high grade spinal canal stenosis noted. Her pain has gradually improved over the last several weeks. Patient denies focal weakness, numbness and tingling. No recent trauma or falls.      Review of Systems  Musculoskeletal:  Positive for back pain and myalgias.  Neurological:  Negative for tingling, sensory change, focal weakness and weakness.  All other systems reviewed and are negative.  Otherwise per HPI.  Assessment & Plan: Visit Diagnoses:    ICD-10-CM   1. Acute right-sided thoracic back pain  M54.6     2. Myofascial pain syndrome  M79.18        Plan: Findings:  Acute right sided thoracic back pain radiating around to ribs. Patient continues to have severe pain despite good conservative therapies such as home exercise regimen, rest and use of medications. Patient did bring CD of thoracic imaging with her, I did discuss imaging with her today. Patients clinical  presentation and exam are consistent with thoracic radiculopathy. There is moderate right paracentral disc protrusion at T6-T7 with disc material contacting ventral cervical spinal cord. We discussed treatment plan in detail today including thoracic epidural steroid injection. She would like to continue with conservative treatments at this time, she is schedule to start formal physical therapy next week. I also discussed medication management and prescribed short course of Meloxicam. Should her pain persist would recommend moving forward with diagnostic and hopefully therapeutic right T7-T8 interlaminar epidural steroid injection under fluoroscopic guidance. Would also consider transforaminal approach. I discussed injection procedure in detail today and provided her with educational material to take home and review. Patient will let us know if she would like to proceed with injection upon completion of formal physical therapy. She can follow up as needed.     Meds & Orders:  Meds ordered this encounter  Medications   meloxicam (MOBIC) 15 MG tablet    Sig: Take 1 tablet (15 mg total) by mouth daily.    Dispense:  30 tablet    Refill:  0   No orders of the defined types were placed in this encounter.   Follow-up: Return if symptoms worsen or fail to improve.   Procedures: No procedures performed      Clinical History: Janeth Rase, MD - 12/24/2022  Formatting of this note might be different from the original.  CLINICAL DATA:  Back pain, T12 compression fracture.   EXAM:  MRI THORACIC SPINE WITHOUT CONTRAST   TECHNIQUE:  Multiplanar, multisequence MR imaging of the thoracic spine was  performed. No intravenous contrast was administered.   COMPARISON:  None Available.   FINDINGS:  Alignment: Physiologic.   Vertebrae: No acute fracture, evidence of discitis, or aggressive  bone lesion. Small hemangioma in the T5 vertebral body.   Cord:  Normal signal and morphology.    Paraspinal and other soft tissues: No acute paraspinal abnormality.   Disc levels:   Disc spaces:  Disc spaces are maintained.   Degenerative disease with mild disc height loss at T5-6, T6-7, T7-8  and T10-11.   T1-T2: No disc protrusion, foraminal stenosis or central canal  stenosis.   T2-T3: No disc protrusion, foraminal stenosis or central canal  stenosis.   T3-T4: No disc protrusion, foraminal stenosis or central canal  stenosis.   T4-T5: No disc protrusion, foraminal stenosis or central canal  stenosis.   T5-T6: No disc protrusion, foraminal stenosis or central canal  stenosis.   T6-T7: Moderate right paracentral disc protrusion with inferior  migration of disc material contacting the ventral cervical spinal  cord. No foraminal or central canal stenosis.   T7-T8: No disc protrusion, foraminal stenosis or central canal  stenosis.   T8-T9: Small central disc protrusion. No foraminal or central canal  stenosis.   T9-T10: No disc protrusion, foraminal stenosis or central canal  stenosis.   T10-T11: No disc protrusion, foraminal stenosis or central canal  stenosis.   T11-T12: No disc protrusion, foraminal stenosis or central canal  stenosis.   IMPRESSION:  1. No acute osseous injury of the thoracic spine.  2. At T6-7 there is a moderate right paracentral disc protrusion  with inferior migration of disc material contacting the ventral  cervical spinal cord.  3. At T8-9 there is a small central disc protrusion.  4. No foraminal or central canal stenosis of the thoracic spine.    Electronically Signed    By: Elige Ko M.D.    On: 12/24/2022 14:04   She reports that she has never smoked. She has never been exposed to tobacco smoke. She has never used smokeless tobacco. No results for input(s): "HGBA1C", "LABURIC" in the last 8760 hours.  Objective:  VS:  HT:    WT:   BMI:     BP:   HR: bpm  TEMP: ( )  RESP:  Physical Exam Vitals and nursing note  reviewed.  HENT:     Head: Normocephalic and atraumatic.     Right Ear: External ear normal.     Left Ear: External ear normal.     Nose: Nose normal.     Mouth/Throat:     Mouth: Mucous membranes are moist.  Eyes:     Extraocular Movements: Extraocular movements intact.  Cardiovascular:     Rate and Rhythm: Normal rate.     Pulses: Normal pulses.  Pulmonary:     Effort: Pulmonary effort is normal.  Abdominal:     General: Abdomen is flat. There is no distension.  Musculoskeletal:        General: Tenderness present.     Cervical back: Normal range of motion.     Comments: Normal thoracic kyphosis noted. No tenderness noted upon palpation of spinous processes. No step off deformity. No pain noted with flexion, extension, lateral bending and rotation. Tenderness noted to right thoracic paraspinal regions, right axilla and right rib cage upon palpation. Good strength noted to bilateral upper extremities.    Skin:    General: Skin is warm and dry.  Capillary Refill: Capillary refill takes less than 2 seconds.  Neurological:     General: No focal deficit present.     Mental Status: She is alert.  Psychiatric:        Mood and Affect: Mood normal.        Behavior: Behavior normal.     Ortho Exam  Imaging: No results found.  Past Medical/Family/Surgical/Social History: Medications & Allergies reviewed per EMR, new medications updated. Patient Active Problem List   Diagnosis Date Noted   Osteoarthritis, multiple sites 11/12/2022   Pain in right hip 11/12/2022   Numbness and tingling 11/12/2022   Post-operative state 11/21/2020   H/O vaginal hysterectomy 11/21/2020   Vaginal discharge 10/05/2020   Menopausal symptoms 09/01/2020   Dermatophytosis 05/25/2020   Obesity 04/28/2018   Anxiety and depression 10/04/2016   Past Medical History:  Diagnosis Date   Anxiety    NO RECENT ANXIETY ATTACKS PER PT ON 10-04-2020   Arthritis    OA LEFT KNEE   Claustrophobia 10/04/2020    COVID-19 2020   COUGH SOB X 6 WEEKS ALL SYMPTOMS RESOLVED   GERD (gastroesophageal reflux disease)    Headache    Hypertension    not currently on medications   Wears glasses 10/04/2020   Family History  Problem Relation Age of Onset   Asthma Mother    Past Surgical History:  Procedure Laterality Date   BREAST LUMPECTOMY Right 1991   BENIGN   CHOLECYSTECTOMY  2006   LAPAROSCOPIC   DILATATION AND CURETTAGE/HYSTEROSCOPY WITH MINERVA N/A 07/30/2018   Procedure: DILATATION AND CURETTAGE/HYSTEROSCOPY WITH MINERVA;  Surgeon: Allie Bossier, MD;  Location: [facility];  Service: Gynecology;  Laterality: N/A;   TUBAL LIGATION     21 YRS AGO PER PT ON 10-04-2020   VAGINAL HYSTERECTOMY  10/11/2020   Procedure: TOTAL HYSTERECTOMY VAGINAL with RIGHT SALPINGECTOMY;  Surgeon: Hermina Staggers, MD;  Location: [facility] [place];  Service: Gynecology;;   Social History   Occupational History   Not on file  Tobacco Use   Smoking status: Never    Passive exposure: Never   Smokeless tobacco: Never  Vaping Use   Vaping status: Never Used  Substance and Sexual Activity   Alcohol use: No   Drug use: No   Sexual activity: Yes    Birth control/protection: Surgical

## 2023-01-10 ENCOUNTER — Other Ambulatory Visit: Payer: Self-pay

## 2023-01-10 ENCOUNTER — Ambulatory Visit: Payer: Commercial Managed Care - PPO | Attending: Family

## 2023-01-10 DIAGNOSIS — M6281 Muscle weakness (generalized): Secondary | ICD-10-CM

## 2023-01-10 DIAGNOSIS — M546 Pain in thoracic spine: Secondary | ICD-10-CM

## 2023-01-10 NOTE — Therapy (Addendum)
OUTPATIENT PHYSICAL THERAPY THORACOLUMBAR EVALUATION   Patient Name: Kristine Kelly MRN: [id number] DOB:Oct 15, 1970, 52 y.o., female Today's Date: 01/10/2023  END OF SESSION:  PT End of Session - 01/10/23 1358     Visit Number 1    Number of Visits 13    Date for PT Re-Evaluation 02/21/23    Authorization Type [employer]    PT Start Time 1315    PT Stop Time 1354    PT Time Calculation (min) 39 min    Activity Tolerance Patient tolerated treatment well    Behavior During Therapy WFL for tasks assessed/performed             Past Medical History:  Diagnosis Date   Anxiety    NO RECENT ANXIETY ATTACKS PER PT ON 10-04-2020   Arthritis    OA LEFT KNEE   Claustrophobia 10/04/2020   COVID-19 2020   COUGH SOB X 6 WEEKS ALL SYMPTOMS RESOLVED   GERD (gastroesophageal reflux disease)    Headache    Hypertension    not currently on medications   Wears glasses 10/04/2020   Past Surgical History:  Procedure Laterality Date   BREAST LUMPECTOMY Right 1991   BENIGN   CHOLECYSTECTOMY  2006   LAPAROSCOPIC   DILATATION AND CURETTAGE/HYSTEROSCOPY WITH MINERVA N/A 07/30/2018   Procedure: DILATATION AND CURETTAGE/HYSTEROSCOPY WITH MINERVA;  Surgeon: Allie Bossier, MD;  Location: [facility];  Service: Gynecology;  Laterality: N/A;   TUBAL LIGATION     21 YRS AGO PER PT ON 10-04-2020   VAGINAL HYSTERECTOMY  10/11/2020   Procedure: TOTAL HYSTERECTOMY VAGINAL with RIGHT SALPINGECTOMY;  Surgeon: Hermina Staggers, MD;  Location: [facility];  Service: Gynecology;;   Patient Active Problem List   Diagnosis Date Noted   Osteoarthritis, multiple sites 11/12/2022   Pain in right hip 11/12/2022   Numbness and tingling 11/12/2022   Post-operative state 11/21/2020   H/O vaginal hysterectomy 11/21/2020   Vaginal discharge 10/05/2020   Menopausal symptoms 09/01/2020   Dermatophytosis 05/25/2020   Obesity 04/28/2018   Anxiety and depression 10/04/2016    PCP: Rema Fendt, NP  REFERRING PROVIDER: Persons, [place], PA   REFERRING DIAG: M54.6 (ICD-10-CM) - Pain in thoracic spine   Rationale for Evaluation and Treatment: Rehabilitation  THERAPY DIAG:  Pain in thoracic spine  Muscle weakness (generalized)  ONSET DATE: October 2024  SUBJECTIVE:                                                                                                                                                                                           SUBJECTIVE  STATEMENT: Pt presents to PT with roughly 2 month hx of R sided thoracic back pain after lifting a box of soda while at work. She also promotes bilateral LE pain and N/T, denies bowel/bladder changes or saddle anesthesia. Lying on her R side greatly increases pain, also notes that she can't reach with her R UE without increasing pain, she is R hand dominant.   PERTINENT HISTORY:  HTN, see other PMH  PAIN:  Are you having pain?  Yes: NPRS scale: 5/10 Worst: 10/10 Pain location: R sided thoracic Pain description: sharp, N/T Aggravating factors: lying on R side Relieving factors: none  PRECAUTIONS: None  RED FLAGS: None   WEIGHT BEARING RESTRICTIONS: No  FALLS:  Has patient fallen in last 6 months? No  LIVING ENVIRONMENT: Lives with: lives alone Lives in: House/apartment  OCCUPATION: International aid/development worker [place]   PLOF: Independent  PATIENT GOALS: decrease her back pain, improve her strength  NEXT MD VISIT: As needed  OBJECTIVE:  Note: Objective measures were completed at Evaluation unless otherwise noted.  DIAGNOSTIC FINDINGS:  Formatting of this note might be different from the original. CLINICAL DATA: Back pain, T12 compression fracture.  EXAM: MRI THORACIC SPINE WITHOUT CONTRAST  TECHNIQUE: Multiplanar, multisequence MR imaging of the thoracic spine was performed. No intravenous contrast was administered.  COMPARISON: None Available.  FINDINGS: Alignment:  Physiologic.  Vertebrae: No acute fracture, evidence of discitis, or aggressive bone lesion. Small hemangioma in the T5 vertebral body.  Cord: Normal signal and morphology.  Paraspinal and other soft tissues: No acute paraspinal abnormality.  Disc levels:  Disc spaces: Disc spaces are maintained.  Degenerative disease with mild disc height loss at T5-6, T6-7, T7-8 and T10-11.  T1-T2: No disc protrusion, foraminal stenosis or central canal stenosis.  T2-T3: No disc protrusion, foraminal stenosis or central canal stenosis.  T3-T4: No disc protrusion, foraminal stenosis or central canal stenosis.  T4-T5: No disc protrusion, foraminal stenosis or central canal stenosis.  T5-T6: No disc protrusion, foraminal stenosis or central canal stenosis.  T6-T7: Moderate right paracentral disc protrusion with inferior migration of disc material contacting the ventral cervical spinal cord. No foraminal or central canal stenosis.  T7-T8: No disc protrusion, foraminal stenosis or central canal stenosis.  T8-T9: Small central disc protrusion. No foraminal or central canal stenosis.  T9-T10: No disc protrusion, foraminal stenosis or central canal stenosis.  T10-T11: No disc protrusion, foraminal stenosis or central canal stenosis.  T11-T12: No disc protrusion, foraminal stenosis or central canal stenosis.  IMPRESSION: 1. No acute osseous injury of the thoracic spine. 2. At T6-7 there is a moderate right paracentral disc protrusion with inferior migration of disc material contacting the ventral cervical spinal cord. 3. At T8-9 there is a small central disc protrusion. 4. No foraminal or central canal stenosis of the thoracic spine.   PATIENT SURVEYS:  FOTO: 44% function; 62% predicted  COGNITION: Overall cognitive status: Within functional limits for tasks assessed     SENSATION: WFL  POSTURE: rounded shoulders and forward head  PALPATION: TTP to R rhomboids, R  latissimus   THORACIC/LUMBAR ROM:   AROM eval  Flexion WFL  Extension 50% reudced  Right lateral flexion   Left lateral flexion   Right rotation 25% reduced with pain  Left rotation 25% reduced with pain   (Blank rows = not tested)  UPPER EXTREMITY MMT:  MMT Right eval Left eval  Shoulder flexion 3+/5 5/5  Shoulder abduction    Shoulder IR    Shoulder ER 3/5  4+/5  Shoulder extension    Upper trap    Middle trap    Lower trap    Elbow flexion     Elbow extension    Wrist flexion    Wrist extension    Grip     (Blank rows = not tested)  GAIT: Distance walked: 41ft Assistive device utilized: None Level of assistance: Complete Independence Comments: no overt deviations noted  [facility] Adult PT Treatment:                                                DATE: 01/10/2023 Therapeutic Exercise: Seated sciatic nerve glide x 10  Upper trap stretch x 60" L Seated scapular retractions x 10 Cross arm stretch x 30" Seated biateral ER x 10 YTB                              PATIENT EDUCATION:  Education details: eval findings, FOTO, HEP, POC Person educated: Patient Education method: Explanation, Demonstration, and Handouts Education comprehension: verbalized understanding and returned demonstration  HOME EXERCISE PROGRAM: Access Code: [email or web address] URL: [email or web address] Date: 01/10/2023 Prepared by: Edwinna Areola  Exercises - Seated Sciatic Tensioner  - 1 x daily - 7 x weekly - 2 sets - 15 reps - Seated Upper Trapezius Stretch  - 1 x daily - 7 x weekly - 2 reps - 30 sec hold - Seated Scapular Retraction  - 1 x daily - 7 x weekly - 3 sets - 10 reps - Standing Shoulder Posterior Capsule Stretch  - 1 x daily - 7 x weekly - 2 reps - 30 sec hold - Shoulder External Rotation and Scapular Retraction with Resistance  - 1 x daily - 7 x weekly - 2 sets - 10 reps - yellow band hold  ASSESSMENT:  CLINICAL IMPRESSION: Patient is a 52 y.o. F who was seen today for  physical therapy evaluation and treatment for subacute R sided thoracic pain. Physical findings are consistent with referring provider impression, as pt demonstrates decrease in thoracic ROM and TTP with R periscapular musculature. Unable to rule out possibility of thoracolumbar muscle strain. FOTO score demonstrates decrease in subjective functional ability below PLOF. Pt would benefit from skilled PT services, will progress as able per POC.     OBJECTIVE IMPAIRMENTS: decreased activity tolerance, decreased mobility, difficulty walking, decreased ROM, decreased strength, impaired UE functional use, postural dysfunction, and pain   ACTIVITY LIMITATIONS: carrying, lifting, bending, dressing, self feeding, and reach over head  PARTICIPATION LIMITATIONS: meal prep, cleaning, driving, shopping, community activity, occupation, and yard work  PERSONAL FACTORS: 1 comorbidity: HTN  are also affecting patient's functional outcome.   REHAB POTENTIAL: Excellent  CLINICAL DECISION MAKING: Stable/uncomplicated  EVALUATION COMPLEXITY: Low   GOALS: Goals reviewed with patient? No  SHORT TERM GOALS: Target date: 01/31/2023   Pt will be compliant and knowledgeable with initial HEP for improved comfort and carryover Baseline: initial HEP given  Goal status: INITIAL  2.  Pt will self report right sided thoracic pain pain no greater than 6/10 for improved comfort and functional ability Baseline: 10/10 at worst Goal status: INITIAL   LONG TERM GOALS: Target date: 02/21/2023   Pt will improve FOTO function score to no less than 62% as proxy for functional improvement Baseline: 44% function Goal status: INITIAL  2.  Pt will self report right sided thoracic pain pain no greater than 3/10 for improved comfort and functional ability Baseline: 10/10 at worst Goal status: INITIAL   3.  Pt will increase R ER MMT to no less than 4/5 for improved comfort and functional ability with work and other  ADLs Baseline: 3/5  Goal status: INITIAL   4.  Pt will be able to reach overhead not limited by pain in mid back for improved comfort with work related duites  Baseline: unable Goal status: INITIAL   PLAN:  PT FREQUENCY: 1-2x/week  PT DURATION: 6 weeks  PLANNED INTERVENTIONS: 97164- PT Re-evaluation, 97110-Therapeutic exercises, 97530- Therapeutic activity, [id number]- Neuromuscular re-education, 97535- Self Care, 24401- Manual therapy, [id number]- Aquatic Therapy, 97014- Electrical stimulation (unattended), [id number]- Electrical stimulation (manual), 97016- Vasopneumatic device, Dry Needling, Cryotherapy, and Moist heat  PLAN FOR NEXT SESSION: assess HEP response, periscapular strengthening, STM to periscapular musculature   For all possible CPT codes, reference the Planned Interventions line above.     Check all conditions that are expected to impact treatment: {Conditions expected to impact treatment:Musculoskeletal disorders   If treatment provided at initial evaluation, no treatment charged due to lack of authorization.       Eloy End, PT 01/10/2023, 3:03 PM

## 2023-01-10 NOTE — Addendum Note (Signed)
Addended by: Eloy End on: 01/10/2023 03:27 PM   Modules accepted: Orders

## 2023-01-11 ENCOUNTER — Ambulatory Visit (INDEPENDENT_AMBULATORY_CARE_PROVIDER_SITE_OTHER): Payer: Commercial Managed Care - PPO | Admitting: Family

## 2023-01-11 ENCOUNTER — Encounter: Payer: Self-pay | Admitting: Family

## 2023-01-11 VITALS — BP 150/97 | HR 85 | Temp 97.7°F | Ht 67.0 in | Wt 278.0 lb

## 2023-01-11 DIAGNOSIS — I10 Essential (primary) hypertension: Secondary | ICD-10-CM | POA: Diagnosis not present

## 2023-01-11 MED ORDER — VALSARTAN 40 MG PO TABS: 40.0000 mg | ORAL_TABLET | Freq: Every day | ORAL | 1 refills | Status: DC

## 2023-01-11 NOTE — Progress Notes (Addendum)
Patient ID: Kristine Kelly, female    DOB: [birth year]  MRN: [id number]  CC: Blood Pressure Check   Subjective: Kristine Kelly is a 52 y.o. female who presents for blood pressure check.   Her concerns today include:  Blood pressure check. States in the past Atenolol caused her hair to thin. She does not complain of red flag symptoms such as but not limited to chest pain, shortness of breath, worst headache of life, nausea/vomiting.    Patient Active Problem List   Diagnosis Date Noted   Osteoarthritis, multiple sites 11/12/2022   Pain in right hip 11/12/2022   Numbness and tingling 11/12/2022   Post-operative state 11/21/2020   H/O vaginal hysterectomy 11/21/2020   Vaginal discharge 10/05/2020   Menopausal symptoms 09/01/2020   Dermatophytosis 05/25/2020   Obesity 04/28/2018   Anxiety and depression 10/04/2016     Current Outpatient Medications on File Prior to Visit  Medication Sig Dispense Refill   acetaminophen (TYLENOL) 500 MG tablet Take 1 tablet (500 mg total) by mouth every 6 (six) hours as needed. (Patient not taking: Reported on 01/11/2023) 30 tablet 1   cyclobenzaprine (FLEXERIL) 10 MG tablet Take by mouth. (Patient not taking: Reported on 01/11/2023)     gabapentin (NEURONTIN) 300 MG capsule Take 1 capsule (300 mg total) by mouth at bedtime as needed. (Patient not taking: Reported on 01/11/2023) 30 capsule 1   hydrOXYzine (ATARAX/VISTARIL) 25 MG tablet Take 1 tablet (25 mg total) by mouth every 6 (six) hours as needed for anxiety. (Patient not taking: Reported on 01/11/2023) 30 tablet 0   ibuprofen (ADVIL) 800 MG tablet Take 1 tablet (800 mg total) by mouth 3 (three) times daily. (Patient not taking: Reported on 01/11/2023) 30 tablet 0   meloxicam (MOBIC) 15 MG tablet Take 1 tablet (15 mg total) by mouth daily. (Patient not taking: Reported on 01/11/2023) 30 tablet 0   metroNIDAZOLE (METROGEL) 0.75 % vaginal gel Place 1 Applicatorful vaginally at bedtime. Apply one  applicatorful to vagina at bedtime for 5 days (Patient not taking: Reported on 01/11/2023) 70 g 1   omeprazole (PRILOSEC) 20 MG capsule Take 20 mg by mouth as needed. (Patient not taking: Reported on 01/11/2023)     Current Facility-Administered Medications on File Prior to Visit  Medication Dose Route Frequency Provider Last Rate Last Admin   triamcinolone acetonide (KENALOG-40) injection 40 mg  40 mg Intramuscular Once Rema Fendt, NP        Allergies  Allergen Reactions   Aspirin Other (See Comments)    HEADACHE, DIZZINESS   Atenolol Other (See Comments)    HEADACHES DIZZINESS    Social History   Socioeconomic History   Marital status: Single    Spouse name: Not on file   Number of children: Not on file   Years of education: Not on file   Highest education level: Not on file  Occupational History   Not on file  Tobacco Use   Smoking status: Never    Passive exposure: Never   Smokeless tobacco: Never  Vaping Use   Vaping status: Never Used  Substance and Sexual Activity   Alcohol use: No   Drug use: No   Sexual activity: Yes    Birth control/protection: Surgical  Other Topics Concern   Not on file  Social History Narrative   Not on file   Social Drivers of Health   Financial Resource Strain: Not on file  Food Insecurity: No Food Insecurity (04/28/2018)  Hunger Vital Sign    Worried About Running Out of Food in the Last Year: Never true    Ran Out of Food in the Last Year: Never true  Transportation Needs: No Transportation Needs (04/28/2018)   PRAPARE - Administrator, Civil Service (Medical): No    Lack of Transportation (Non-Medical): No  Physical Activity: Not on file  Stress: Not on file  Social Connections: Not on file  Intimate Partner Violence: Not on file    Family History  Problem Relation Age of Onset   Asthma Mother     Past Surgical History:  Procedure Laterality Date   BREAST LUMPECTOMY Right 1991   BENIGN    CHOLECYSTECTOMY  2006   LAPAROSCOPIC   DILATATION AND CURETTAGE/HYSTEROSCOPY WITH MINERVA N/A 07/30/2018   Procedure: DILATATION AND CURETTAGE/HYSTEROSCOPY WITH MINERVA;  Surgeon: Allie Bossier, MD;  Location: [facility];  Service: Gynecology;  Laterality: N/A;   TUBAL LIGATION     21 YRS AGO PER PT ON 10-04-2020   VAGINAL HYSTERECTOMY  10/11/2020   Procedure: TOTAL HYSTERECTOMY VAGINAL with RIGHT SALPINGECTOMY;  Surgeon: Hermina Staggers, MD;  Location: [facility];  Service: Gynecology;;    ROS: Review of Systems Negative except as stated above  PHYSICAL EXAM: BP (!) 150/97   Pulse 85   Temp 97.7 F (36.5 C) (Oral)   Ht 5\' 7"  (1.702 m)   Wt 278 lb (126.1 kg)   LMP 09/22/2020   SpO2 97%   BMI 43.54 kg/m   Physical Exam HENT:     Head: Normocephalic and atraumatic.     Nose: Nose normal.     Mouth/Throat:     Mouth: Mucous membranes are moist.     Pharynx: Oropharynx is clear.  Eyes:     Extraocular Movements: Extraocular movements intact.     Conjunctiva/sclera: Conjunctivae normal.     Pupils: Pupils are equal, round, and reactive to light.  Cardiovascular:     Rate and Rhythm: Normal rate and regular rhythm.     Pulses: Normal pulses.     Heart sounds: Normal heart sounds.  Pulmonary:     Effort: Pulmonary effort is normal.     Breath sounds: Normal breath sounds.  Musculoskeletal:        General: Normal range of motion.     Cervical back: Normal range of motion and neck supple.  Neurological:     General: No focal deficit present.     Mental Status: She is alert and oriented to person, place, and time.  Psychiatric:        Mood and Affect: Mood normal.        Behavior: Behavior normal.     ASSESSMENT AND PLAN: 1. Primary hypertension (Primary) - Blood pressure not at goal during today's visit. Patient asymptomatic without chest pressure, chest pain, palpitations, shortness of breath, worst headache of life, and any additional  red flag symptoms. - Valsartan as prescribed.  - Routine screening.  - Counseled on blood pressure goal of less than 130/80, low-sodium, DASH diet, medication compliance, and 150 minutes of moderate intensity exercise per week as tolerated. Counseled on medication adherence and adverse effects. - Follow-up with primary provider in 4 weeks or sooner if needed.  - valsartan (DIOVAN) 40 MG tablet; Take 1 tablet (40 mg total) by mouth daily.  Dispense: 30 tablet; Refill: 1 - Basic Metabolic Panel   Patient was given the opportunity to ask questions.  Patient verbalized  understanding of the plan and was able to repeat key elements of the plan. Patient was given clear instructions to go to Emergency Department or return to medical center if symptoms don't improve, worsen, or new problems develop.The patient verbalized understanding.   Orders Placed This Encounter  Procedures   Basic Metabolic Panel     Requested Prescriptions   Signed Prescriptions Disp Refills   valsartan (DIOVAN) 40 MG tablet 30 tablet 1    Sig: Take 1 tablet (40 mg total) by mouth daily.    Return in about 4 weeks (around 02/08/2023) for Follow-Up or next available chronic conditions.  Rema Fendt, NP

## 2023-01-11 NOTE — Progress Notes (Signed)
Patient states you guys are supposed to be talking about her blood pressure.

## 2023-01-12 LAB — BASIC METABOLIC PANEL
BUN/Creatinine Ratio: 12 (ref 9–23)
BUN: 11 mg/dL (ref 6–24)
CO2: 21 mmol/L (ref 20–29)
Calcium: 9.7 mg/dL (ref 8.7–10.2)
Chloride: 104 mmol/L (ref 96–106)
Creatinine, Ser: 0.89 mg/dL (ref 0.57–1.00)
Glucose: 52 mg/dL — ABNORMAL LOW (ref 70–99)
Potassium: 4.1 mmol/L (ref 3.5–5.2)
Sodium: 141 mmol/L (ref 134–144)
eGFR: 78 mL/min/{1.73_m2} (ref >59)

## 2023-01-18 ENCOUNTER — Ambulatory Visit: Payer: Commercial Managed Care - PPO | Admitting: Physical Therapy

## 2023-01-18 ENCOUNTER — Encounter: Payer: Self-pay | Admitting: Physical Therapy

## 2023-01-18 DIAGNOSIS — M546 Pain in thoracic spine: Secondary | ICD-10-CM | POA: Diagnosis not present

## 2023-01-18 DIAGNOSIS — M6281 Muscle weakness (generalized): Secondary | ICD-10-CM

## 2023-01-18 NOTE — Therapy (Signed)
OUTPATIENT PHYSICAL THERAPY THORACOLUMBAR TREATMENT    Patient Name: ELLAKATE BAYLIFF MRN: [id number] DOB:[birth year], 52 y.o., female Today's Date: 01/18/2023  END OF SESSION:  PT End of Session - 01/18/23 1227     Visit Number 2    Number of Visits 13    Date for PT Re-Evaluation 02/21/23    Authorization Type [employer]/ [employer]    Authorization Time Period Requested    PT Start Time 1230    PT Stop Time 1315    PT Time Calculation (min) 45 min             Past Medical History:  Diagnosis Date   Anxiety    NO RECENT ANXIETY ATTACKS PER PT ON 10-04-2020   Arthritis    OA LEFT KNEE   Claustrophobia 10/04/2020   COVID-19 2020   COUGH SOB X 6 WEEKS ALL SYMPTOMS RESOLVED   GERD (gastroesophageal reflux disease)    Headache    Hypertension    not currently on medications   Wears glasses 10/04/2020   Past Surgical History:  Procedure Laterality Date   BREAST LUMPECTOMY Right 1991   BENIGN   CHOLECYSTECTOMY  2006   LAPAROSCOPIC   DILATATION AND CURETTAGE/HYSTEROSCOPY WITH MINERVA N/A 07/30/2018   Procedure: DILATATION AND CURETTAGE/HYSTEROSCOPY WITH MINERVA;  Surgeon: Allie Bossier, MD;  Location: [facility];  Service: Gynecology;  Laterality: N/A;   TUBAL LIGATION     21 YRS AGO PER PT ON 10-04-2020   VAGINAL HYSTERECTOMY  10/11/2020   Procedure: TOTAL HYSTERECTOMY VAGINAL with RIGHT SALPINGECTOMY;  Surgeon: Hermina Staggers, MD;  Location: [facility];  Service: Gynecology;;   Patient Active Problem List   Diagnosis Date Noted   Osteoarthritis, multiple sites 11/12/2022   Pain in right hip 11/12/2022   Numbness and tingling 11/12/2022   Post-operative state 11/21/2020   H/O vaginal hysterectomy 11/21/2020   Vaginal discharge 10/05/2020   Menopausal symptoms 09/01/2020   Dermatophytosis 05/25/2020   Obesity 04/28/2018   Anxiety and depression 10/04/2016    PCP: Rema Fendt, NP  REFERRING PROVIDER: Persons, [place], [place]    REFERRING DIAG: M54.6 (ICD-10-CM) - Pain in thoracic spine   Rationale for Evaluation and Treatment: Rehabilitation  THERAPY DIAG:  Pain in thoracic spine  Muscle weakness (generalized)  ONSET DATE: October 2024  SUBJECTIVE:                                                                                                                                                                                           SUBJECTIVE STATEMENT: Pt reports doing "so, so" . Arrives with  lumbar back brace that she is trying for the first time. Pain 5/10 now,  At night pain can be 10/10 with turning over.    EVAL: Pt presents to PT with roughly 2 month hx of R sided thoracic back pain after lifting a box of soda while at work. She also promotes bilateral LE pain and N/T, denies bowel/bladder changes or saddle anesthesia. Lying on her R side greatly increases pain, also notes that she can't reach with her R UE without increasing pain, she is R hand dominant.   PERTINENT HISTORY:  HTN, see other PMH  PAIN:  Are you having pain?  Yes: NPRS scale: 5/10 Worst: 10/10 Pain location: R sided thoracic Pain description: sharp, N/T Aggravating factors: lying on R side Relieving factors: none  PRECAUTIONS: None  RED FLAGS: None   WEIGHT BEARING RESTRICTIONS: No  FALLS:  Has patient fallen in last 6 months? No  LIVING ENVIRONMENT: Lives with: lives alone Lives in: House/apartment  OCCUPATION: International aid/development worker [employer]   PLOF: Independent  PATIENT GOALS: decrease her back pain, improve her strength  NEXT MD VISIT: As needed  OBJECTIVE:  Note: Objective measures were completed at Evaluation unless otherwise noted.  DIAGNOSTIC FINDINGS:  Formatting of this note might be different from the original. CLINICAL DATA: Back pain, T12 compression fracture.  EXAM: MRI THORACIC SPINE WITHOUT CONTRAST  TECHNIQUE: Multiplanar, multisequence MR imaging of the thoracic spine was performed.  No intravenous contrast was administered.  COMPARISON: None Available.  FINDINGS: Alignment: Physiologic.  Vertebrae: No acute fracture, evidence of discitis, or aggressive bone lesion. Small hemangioma in the T5 vertebral body.  Cord: Normal signal and morphology.  Paraspinal and other soft tissues: No acute paraspinal abnormality.  Disc levels:  Disc spaces: Disc spaces are maintained.  Degenerative disease with mild disc height loss at T5-6, T6-7, T7-8 and T10-11.  T1-T2: No disc protrusion, foraminal stenosis or central canal stenosis.  T2-T3: No disc protrusion, foraminal stenosis or central canal stenosis.  T3-T4: No disc protrusion, foraminal stenosis or central canal stenosis.  T4-T5: No disc protrusion, foraminal stenosis or central canal stenosis.  T5-T6: No disc protrusion, foraminal stenosis or central canal stenosis.  T6-T7: Moderate right paracentral disc protrusion with inferior migration of disc material contacting the ventral cervical spinal cord. No foraminal or central canal stenosis.  T7-T8: No disc protrusion, foraminal stenosis or central canal stenosis.  T8-T9: Small central disc protrusion. No foraminal or central canal stenosis.  T9-T10: No disc protrusion, foraminal stenosis or central canal stenosis.  T10-T11: No disc protrusion, foraminal stenosis or central canal stenosis.  T11-T12: No disc protrusion, foraminal stenosis or central canal stenosis.  IMPRESSION: 1. No acute osseous injury of the thoracic spine. 2. At T6-7 there is a moderate right paracentral disc protrusion with inferior migration of disc material contacting the ventral cervical spinal cord. 3. At T8-9 there is a small central disc protrusion. 4. No foraminal or central canal stenosis of the thoracic spine.   PATIENT SURVEYS:  FOTO: 44% function; 62% predicted  COGNITION: Overall cognitive status: Within functional limits for tasks  assessed     SENSATION: WFL  POSTURE: rounded shoulders and forward head  PALPATION: TTP to R rhomboids, R latissimus   THORACIC/LUMBAR ROM:   AROM eval  Flexion WFL  Extension 50% reudced  Right lateral flexion   Left lateral flexion   Right rotation 25% reduced with pain  Left rotation 25% reduced with pain   (Blank rows = not tested)  UPPER  EXTREMITY MMT:  MMT Right eval Left eval  Shoulder flexion 3+/5 5/5  Shoulder abduction    Shoulder IR    Shoulder ER 3/5 4+/5  Shoulder extension    Upper trap    Middle trap    Lower trap    Elbow flexion     Elbow extension    Wrist flexion    Wrist extension    Grip     (Blank rows = not tested)  GAIT: Distance walked: 42ft Assistive device utilized: None Level of assistance: Complete Independence Comments: no overt deviations noted  [facility] Adult PT Treatment:                                                DATE: 01/18/23 Therapeutic Exercise: Nustep L2 UE/LE x 5 minutes UE/LE  Seated scap 5 sec  x 8  Seated Nerve tensioner  Seated upper trap stretch 20 sec 2  x each -  Seated bilateral ER 6 x 2 YTB LTR small ROM x 4 each way  Supine bilateral shoulder flexion 6 x 2 Supine cross body stretch   Supine sciatic nerve tensioner  Self Care: Posture  Pacing with therex  Breathing/relaxation    [facility] Adult PT Treatment:                                                DATE: 01/10/2023 Therapeutic Exercise: Seated sciatic nerve glide x 10  Upper trap stretch x 60" L Seated scapular retractions x 10 Cross arm stretch x 30" Seated biateral ER x 10 YTB                              PATIENT EDUCATION:  Education details: eval findings, FOTO, HEP, POC Person educated: Patient Education method: Explanation, Demonstration, and Handouts Education comprehension: verbalized understanding and returned demonstration  HOME EXERCISE PROGRAM: Access Code: [email or web address] URL: [email or web address] Date:  01/10/2023 Prepared by: Edwinna Areola  Exercises - Seated Sciatic Tensioner  - 1 x daily - 7 x weekly - 2 sets - 15 reps - Seated Upper Trapezius Stretch  - 1 x daily - 7 x weekly - 2 reps - 30 sec hold - Seated Scapular Retraction  - 1 x daily - 7 x weekly - 3 sets - 10 reps - Standing Shoulder Posterior Capsule Stretch  - 1 x daily - 7 x weekly - 2 reps - 30 sec hold - Shoulder External Rotation and Scapular Retraction with Resistance  - 1 x daily - 7 x weekly - 2 sets - 10 reps - yellow band hold 01/18/23 - Supine Lower Trunk Rotation  - 1 x daily - 7 x weekly - 2 sets - 10 reps - Supine shoulder Flexion Extension AAROM with Dowel to comfortable height overhead  - 1 x daily - 7 x weekly - 2 sets - 10 reps  ASSESSMENT:  CLINICAL IMPRESSION:  Pt arrives reporting non compliance with HEP except seated LE tensioner. She is trying a back brace. She was educated on posture and postural tension as it relates to her HEP. She was encouraged to  try relaxation techniques. Began Nustep and reviewed entire HEP with education on  its benefit for her condition. Pt was encouraged to begin with minimal reps and to not "over do" her HEP. She was given tips on log roll to decrease pain with bed mobility. Added to her HEP as her next appointment is in 2 weeks due to wait list. Will continue to progress as tolerated.    Patient is a 52 y.o. F who was seen today for physical therapy evaluation and treatment for subacute R sided thoracic pain. Physical findings are consistent with referring provider impression, as pt demonstrates decrease in thoracic ROM and TTP with R periscapular musculature. Unable to rule out possibility of thoracolumbar muscle strain. FOTO score demonstrates decrease in subjective functional ability below PLOF. Pt would benefit from skilled PT services, will progress as able per POC.     OBJECTIVE IMPAIRMENTS: decreased activity tolerance, decreased mobility, difficulty walking, decreased ROM,  decreased strength, impaired UE functional use, postural dysfunction, and pain   ACTIVITY LIMITATIONS: carrying, lifting, bending, dressing, self feeding, and reach over head  PARTICIPATION LIMITATIONS: meal prep, cleaning, driving, shopping, community activity, occupation, and yard work  PERSONAL FACTORS: 1 comorbidity: HTN  are also affecting patient's functional outcome.   REHAB POTENTIAL: Excellent  CLINICAL DECISION MAKING: Stable/uncomplicated  EVALUATION COMPLEXITY: Low   GOALS: Goals reviewed with patient? No  SHORT TERM GOALS: Target date: 01/31/2023   Pt will be compliant and knowledgeable with initial HEP for improved comfort and carryover Baseline: initial HEP given  Goal status: INITIAL  2.  Pt will self report right sided thoracic pain pain no greater than 6/10 for improved comfort and functional ability Baseline: 10/10 at worst Goal status: INITIAL   LONG TERM GOALS: Target date: 02/21/2023   Pt will improve FOTO function score to no less than 62% as proxy for functional improvement Baseline: 44% function Goal status: INITIAL   2.  Pt will self report right sided thoracic pain pain no greater than 3/10 for improved comfort and functional ability Baseline: 10/10 at worst Goal status: INITIAL   3.  Pt will increase R ER MMT to no less than 4/5 for improved comfort and functional ability with work and other ADLs Baseline: 3/5  Goal status: INITIAL   4.  Pt will be able to reach overhead not limited by pain in mid back for improved comfort with work related duites  Baseline: unable Goal status: INITIAL   PLAN:  PT FREQUENCY: 1-2x/week  PT DURATION: 6 weeks  PLANNED INTERVENTIONS: 97164- PT Re-evaluation, 97110-Therapeutic exercises, 97530- Therapeutic activity, [id number]- Neuromuscular re-education, 97535- Self Care, 81191- Manual therapy, [id number]- Aquatic Therapy, 97014- Electrical stimulation (unattended), [id number]- Electrical stimulation (manual), 97016-  Vasopneumatic device, Dry Needling, Cryotherapy, and Moist heat  PLAN FOR NEXT SESSION: assess HEP response, periscapular strengthening, STM to periscapular musculature   For all possible CPT codes, reference the Planned Interventions line above.     Check all conditions that are expected to impact treatment: {Conditions expected to impact treatment:Musculoskeletal disorders   If treatment provided at initial evaluation, no treatment charged due to lack of authorization.       Jannette Spanner, PTA 01/18/23 1:42 PM Phone: [phone] Fax: [phone]

## 2023-01-22 ENCOUNTER — Ambulatory Visit: Payer: Self-pay | Admitting: *Deleted

## 2023-01-22 NOTE — Telephone Encounter (Signed)
  Chief Complaint: Started on Diovan  40 mg on 01/11/2023 by Greig Drones, NP.   Symptoms: Waking up every morning with a headache.    She takes it at night. Frequency: Daily since starting it. Pertinent Negatives: Patient denies other symptoms Disposition: [] ED /[] Urgent Care (no appt availability in office) / [] Appointment(In office/virtual)/ []  [facility]/ [] Home Care/ [] Refused Recommended Disposition /[] [facility]/ [x]  Follow-up with PCP Additional Notes: Message sent to Greig Drones, NP.

## 2023-01-22 NOTE — Telephone Encounter (Signed)
 Message from Kristine Kelly sent at 01/22/2023 10:10 AM EST  Summary: Head pain   valsartan  (DIOVAN ) 40 MG tablet ,,, taking this med since 12/20 and have been waking up with head pain just about every day.  Is this med too strong for me?          Call History  Contact Date/Time Type Contact Phone/Fax By  01/22/2023 10:06 AM EST Phone (Incoming) Cookson, Raechel H (Self) [phone] (M) Kristine Marda BRAVO   Reason for Disposition  [1] Caller has URGENT medicine question about med that PCP or specialist prescribed AND [2] triager unable to answer question  Answer Assessment - Initial Assessment Questions 1. NAME of MEDICINE: What medicine(s) are you calling about?     Diovan  40 mg 2. QUESTION: What is your question? (e.g., double dose of medicine, side effect)     I wake up with a headache every morning since I started this on 12.20/2024. 3. PRESCRIBER: Who prescribed the medicine? Reason: if prescribed by specialist, call should be referred to that group.     Greig Drones, NP 4. SYMPTOMS: Do you have any symptoms? If Yes, ask: What symptoms are you having?  How bad are the symptoms (e.g., mild, moderate, severe)     I wake up every morning with a headache.   I take the medicine at night. 5. PREGNANCY:  Is there any chance that you are pregnant? When was your last menstrual period?     Not asked  Protocols used: Medication Question Call-A-AH

## 2023-01-24 ENCOUNTER — Ambulatory Visit: Payer: Commercial Managed Care - PPO | Attending: Family

## 2023-01-24 DIAGNOSIS — M6281 Muscle weakness (generalized): Secondary | ICD-10-CM | POA: Insufficient documentation

## 2023-01-24 DIAGNOSIS — M546 Pain in thoracic spine: Secondary | ICD-10-CM | POA: Insufficient documentation

## 2023-01-24 NOTE — Telephone Encounter (Signed)
 Please schedule patient for virtue visit (HTN)

## 2023-01-24 NOTE — Therapy (Signed)
 OUTPATIENT PHYSICAL THERAPY THORACOLUMBAR TREATMENT    Patient Name: Kristine Kelly MRN: [id number] DOB:1970/08/18, 53 y.o., female Today's Date: 01/24/2023  END OF SESSION:  PT End of Session - 01/24/23 0859     Visit Number 3    Number of Visits 13    Date for PT Re-Evaluation 02/21/23    Authorization Type [employer]/ [employer]    Authorization Time Period Requested    PT Start Time 0848    PT Stop Time 0927    PT Time Calculation (min) 39 min              Past Medical History:  Diagnosis Date   Anxiety    NO RECENT ANXIETY ATTACKS PER PT ON 10-04-2020   Arthritis    OA LEFT KNEE   Claustrophobia 10/04/2020   COVID-19 2020   COUGH SOB X 6 WEEKS ALL SYMPTOMS RESOLVED   GERD (gastroesophageal reflux disease)    Headache    Hypertension    not currently on medications   Wears glasses 10/04/2020   Past Surgical History:  Procedure Laterality Date   BREAST LUMPECTOMY Right 1991   BENIGN   CHOLECYSTECTOMY  2006   LAPAROSCOPIC   DILATATION AND CURETTAGE/HYSTEROSCOPY WITH MINERVA N/A 07/30/2018   Procedure: DILATATION AND CURETTAGE/HYSTEROSCOPY WITH MINERVA;  Surgeon: Starla Harland BROCKS, MD;  Location: [facility];  Service: Gynecology;  Laterality: N/A;   TUBAL LIGATION     21 YRS AGO PER PT ON 10-04-2020   VAGINAL HYSTERECTOMY  10/11/2020   Procedure: TOTAL HYSTERECTOMY VAGINAL with RIGHT SALPINGECTOMY;  Surgeon: Lorence Ozell CROME, MD;  Location: [facility];  Service: Gynecology;;   Patient Active Problem List   Diagnosis Date Noted   Osteoarthritis, multiple sites 11/12/2022   Pain in right hip 11/12/2022   Numbness and tingling 11/12/2022   Post-operative state 11/21/2020   H/O vaginal hysterectomy 11/21/2020   Vaginal discharge 10/05/2020   Menopausal symptoms 09/01/2020   Dermatophytosis 05/25/2020   Obesity 04/28/2018   Anxiety and depression 10/04/2016    PCP: [name], NP  REFERRING PROVIDER: Persons, [name], PA    REFERRING DIAG: M54.6 (ICD-10-CM) - Pain in thoracic spine   Rationale for Evaluation and Treatment: Rehabilitation  THERAPY DIAG:  Pain in thoracic spine  Muscle weakness (generalized)  ONSET DATE: October 2024  SUBJECTIVE:                                                                                                                                                                                           SUBJECTIVE STATEMENT: Pt presents with continued significant pain. Has  been more compliant with HEP.   EVAL: Pt presents to PT with roughly 2 month hx of R sided thoracic back pain after lifting a box of soda while at work. She also promotes bilateral LE pain and N/T, denies bowel/bladder changes or saddle anesthesia. Lying on her R side greatly increases pain, also notes that she can't reach with her R UE without increasing pain, she is R hand dominant.   PERTINENT HISTORY:  HTN, see other PMH  PAIN:  Are you having pain?  Yes: NPRS scale: 5/10 Worst: 10/10 Pain location: R sided thoracic Pain description: sharp, N/T Aggravating factors: lying on R side Relieving factors: none  PRECAUTIONS: None  RED FLAGS: None   WEIGHT BEARING RESTRICTIONS: No  FALLS:  Has patient fallen in last 6 months? No  LIVING ENVIRONMENT: Lives with: lives alone Lives in: House/apartment  OCCUPATION: International Aid/development Worker [employer]   PLOF: Independent  PATIENT GOALS: decrease her back pain, improve her strength  NEXT MD VISIT: As needed  OBJECTIVE:  Note: Objective measures were completed at Evaluation unless otherwise noted.  DIAGNOSTIC FINDINGS:  Formatting of this note might be different from the original. CLINICAL DATA: Back pain, T12 compression fracture.  EXAM: MRI THORACIC SPINE WITHOUT CONTRAST  TECHNIQUE: Multiplanar, multisequence MR imaging of the thoracic spine was performed. No intravenous contrast was administered.  COMPARISON: None  Available.  FINDINGS: Alignment: Physiologic.  Vertebrae: No acute fracture, evidence of discitis, or aggressive bone lesion. Small hemangioma in the T5 vertebral body.  Cord: Normal signal and morphology.  Paraspinal and other soft tissues: No acute paraspinal abnormality.  Disc levels:  Disc spaces: Disc spaces are maintained.  Degenerative disease with mild disc height loss at T5-6, T6-7, T7-8 and T10-11.  T1-T2: No disc protrusion, foraminal stenosis or central canal stenosis.  T2-T3: No disc protrusion, foraminal stenosis or central canal stenosis.  T3-T4: No disc protrusion, foraminal stenosis or central canal stenosis.  T4-T5: No disc protrusion, foraminal stenosis or central canal stenosis.  T5-T6: No disc protrusion, foraminal stenosis or central canal stenosis.  T6-T7: Moderate right paracentral disc protrusion with inferior migration of disc material contacting the ventral cervical spinal cord. No foraminal or central canal stenosis.  T7-T8: No disc protrusion, foraminal stenosis or central canal stenosis.  T8-T9: Small central disc protrusion. No foraminal or central canal stenosis.  T9-T10: No disc protrusion, foraminal stenosis or central canal stenosis.  T10-T11: No disc protrusion, foraminal stenosis or central canal stenosis.  T11-T12: No disc protrusion, foraminal stenosis or central canal stenosis.  IMPRESSION: 1. No acute osseous injury of the thoracic spine. 2. At T6-7 there is a moderate right paracentral disc protrusion with inferior migration of disc material contacting the ventral cervical spinal cord. 3. At T8-9 there is a small central disc protrusion. 4. No foraminal or central canal stenosis of the thoracic spine.   PATIENT SURVEYS:  FOTO: 44% function; 62% predicted  COGNITION: Overall cognitive status: Within functional limits for tasks assessed     SENSATION: WFL  POSTURE: rounded shoulders and forward  head  PALPATION: TTP to R rhomboids, R latissimus   THORACIC/LUMBAR ROM:   AROM eval  Flexion WFL  Extension 50% reudced  Right lateral flexion   Left lateral flexion   Right rotation 25% reduced with pain  Left rotation 25% reduced with pain   (Blank rows = not tested)  UPPER EXTREMITY MMT:  MMT Right eval Left eval  Shoulder flexion 3+/5 5/5  Shoulder abduction    Shoulder  IR    Shoulder ER 3/5 4+/5  Shoulder extension    Upper trap    Middle trap    Lower trap    Elbow flexion     Elbow extension    Wrist flexion    Wrist extension    Grip     (Blank rows = not tested)  GAIT: Distance walked: 17ft Assistive device utilized: None Level of assistance: Complete Independence Comments: no overt deviations noted  [facility] Adult PT Treatment:                                                DATE: 01/24/23 Therapeutic Exercise: Nustep L5 UE/LE x 4 minutes UE/LE LTR x 5  Supine PPT x 10 - 5 hold Supine PPT with ball x 10 - 5 sec Supine horizontal abd 2x10 GTB Supine clamshell 2x15 GTB Supine dow flexion 2x10  Supine dow chest press 2x10 Seated bilateral ER 2x10 RTB Standing row 2x10 GTB Seated thoracic extension over towel x 10  [facility] Adult PT Treatment:                                                DATE: 01/18/23 Therapeutic Exercise: Nustep L2 UE/LE x 5 minutes UE/LE  Seated scap 5 sec  x 8  Seated Nerve tensioner  Seated upper trap stretch 20 sec 2  x each -  Seated bilateral ER 6 x 2 YTB LTR small ROM x 4 each way  Supine bilateral shoulder flexion 6 x 2 Supine cross body stretch   Supine sciatic nerve tensioner  Self Care: Posture  Pacing with therex  Breathing/relaxation  [facility] Adult PT Treatment:                                                DATE: 01/10/2023 Therapeutic Exercise: Seated sciatic nerve glide x 10  Upper trap stretch x 60 L Seated scapular retractions x 10 Cross arm stretch x 30 Seated biateral ER x 10 YTB                               PATIENT EDUCATION:  Education details: 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 Person educated: Patient Education method: Explanation, Demonstration, and Handouts Education comprehension: verbalized understanding and returned demonstration  HOME EXERCISE PROGRAM: Access Code: [email or web address] URL: [email or web address] Date: 01/10/2023 Prepared by: Alm Kingdom  Exercises - Seated Sciatic Tensioner  - 1 x daily - 7 x weekly - 2 sets - 15 reps - Seated Upper Trapezius Stretch  - 1 x daily - 7 x weekly - 2 reps - 30 sec hold - Seated Scapular Retraction  - 1 x daily - 7 x weekly - 3 sets - 10 reps - Standing Shoulder Posterior Capsule Stretch  - 1 x daily - 7 x weekly - 2 reps - 30 sec hold - Shoulder External Rotation and Scapular Retraction with Resistance  - 1 x daily - 7 x weekly - 2 sets - 10 reps - yellow band hold 01/18/23 - Supine Lower Trunk  Rotation  - 1 x daily - 7 x weekly - 2 sets - 10 reps - Supine shoulder Flexion Extension AAROM with Dowel to comfortable height overhead  - 1 x daily - 7 x weekly - 2 sets - 10 reps  ASSESSMENT:  CLINICAL IMPRESSION: Pt tolerated treatment fair but was limited by continued pain in R thoracic to R hip. Therapy focused on light strengthening in order to decrease pain and improve comfort. Educated on product manager for R periscapular area at home. Will continue to progress per POC.   EVAL: Patient is a 53 y.o. F who was seen today for physical therapy evaluation and treatment for subacute R sided thoracic pain. Physical findings are consistent with referring provider impression, as pt demonstrates decrease in thoracic ROM and TTP with R periscapular musculature. Unable to rule out possibility of thoracolumbar muscle strain. FOTO score demonstrates decrease in subjective functional ability below PLOF. Pt would benefit from skilled PT services, will progress as able per POC.     OBJECTIVE IMPAIRMENTS: decreased activity  tolerance, decreased mobility, difficulty walking, decreased ROM, decreased strength, impaired UE functional use, postural dysfunction, and pain   ACTIVITY LIMITATIONS: carrying, lifting, bending, dressing, self feeding, and reach over head  PARTICIPATION LIMITATIONS: meal prep, cleaning, driving, shopping, community activity, occupation, and yard work  PERSONAL FACTORS: 1 comorbidity: HTN  are also affecting patient's functional outcome.   REHAB POTENTIAL: Excellent  CLINICAL DECISION MAKING: Stable/uncomplicated  EVALUATION COMPLEXITY: Low   GOALS: Goals reviewed with patient? No  SHORT TERM GOALS: Target date: 01/31/2023   Pt will be compliant and knowledgeable with initial HEP for improved comfort and carryover Baseline: initial HEP given  Goal status: INITIAL  2.  Pt will self report right sided thoracic pain pain no greater than 6/10 for improved comfort and functional ability Baseline: 10/10 at worst Goal status: INITIAL   LONG TERM GOALS: Target date: 02/21/2023   Pt will improve FOTO function score to no less than 62% as proxy for functional improvement Baseline: 44% function Goal status: INITIAL   2.  Pt will self report right sided thoracic pain pain no greater than 3/10 for improved comfort and functional ability Baseline: 10/10 at worst Goal status: INITIAL   3.  Pt will increase R ER MMT to no less than 4/5 for improved comfort and functional ability with work and other ADLs Baseline: 3/5  Goal status: INITIAL   4.  Pt will be able to reach overhead not limited by pain in mid back for improved comfort with work related duites  Baseline: unable Goal status: INITIAL   PLAN:  PT FREQUENCY: 1-2x/week  PT DURATION: 6 weeks  PLANNED INTERVENTIONS: 97164- PT Re-evaluation, 97110-Therapeutic exercises, 97530- Therapeutic activity, [id number]- Neuromuscular re-education, 97535- Self Care, [id number]- Manual therapy, [id number]- Aquatic Therapy, 97014- Electrical stimulation  (unattended), [id number]- Electrical stimulation (manual), 97016- Vasopneumatic device, Dry Needling, Cryotherapy, and Moist heat  PLAN FOR NEXT SESSION: assess HEP response, periscapular strengthening, STM to periscapular musculature   For all possible CPT codes, reference the Planned Interventions line above.     Check all conditions that are expected to impact treatment: {Conditions expected to impact treatment:Musculoskeletal disorders   If treatment provided at initial evaluation, no treatment charged due to lack of authorization.       Alm JAYSON Kingdom PT  01/24/23 9:28 AM

## 2023-01-24 NOTE — Telephone Encounter (Signed)
 Report to Emergency Department/Urgent Care/call 911 for immediate medical evaluation. Follow-up with Primary Care.

## 2023-01-28 ENCOUNTER — Ambulatory Visit (INDEPENDENT_AMBULATORY_CARE_PROVIDER_SITE_OTHER): Payer: Commercial Managed Care - PPO | Admitting: Physician Assistant

## 2023-01-28 ENCOUNTER — Ambulatory Visit (INDEPENDENT_AMBULATORY_CARE_PROVIDER_SITE_OTHER): Payer: Commercial Managed Care - PPO

## 2023-01-28 ENCOUNTER — Encounter: Payer: Self-pay | Admitting: Physician Assistant

## 2023-01-28 VITALS — BP 144/93 | HR 87 | Temp 98.1°F | Resp 16 | Wt 279.0 lb

## 2023-01-28 DIAGNOSIS — S60012A Contusion of left thumb without damage to nail, initial encounter: Secondary | ICD-10-CM | POA: Diagnosis not present

## 2023-01-28 DIAGNOSIS — S60222A Contusion of left hand, initial encounter: Secondary | ICD-10-CM

## 2023-01-28 DIAGNOSIS — W009XXA Unspecified fall due to ice and snow, initial encounter: Secondary | ICD-10-CM

## 2023-01-28 DIAGNOSIS — T887XXA Unspecified adverse effect of drug or medicament, initial encounter: Secondary | ICD-10-CM

## 2023-01-28 DIAGNOSIS — S7001XA Contusion of right hip, initial encounter: Secondary | ICD-10-CM

## 2023-01-28 DIAGNOSIS — S300XXA Contusion of lower back and pelvis, initial encounter: Secondary | ICD-10-CM

## 2023-01-28 DIAGNOSIS — I10 Essential (primary) hypertension: Secondary | ICD-10-CM

## 2023-01-28 MED ORDER — VALSARTAN 40 MG PO TABS: 40.0000 mg | ORAL_TABLET | Freq: Every day | ORAL | 1 refills | Status: DC

## 2023-01-28 MED ORDER — MELOXICAM 15 MG PO TABS
15.0000 mg | ORAL_TABLET | Freq: Every day | ORAL | 2 refills | Status: AC
Start: 2023-01-28 — End: 2024-01-28

## 2023-01-28 MED ORDER — METHOCARBAMOL 500 MG PO TABS: 1000.0000 mg | ORAL_TABLET | Freq: Four times a day (QID) | ORAL | 1 refills | Status: AC

## 2023-01-28 NOTE — Patient Instructions (Signed)
 Take 1/2 valsartan  daily and drink 64 to 80 ounces water to offset any side effects.  Check blood pressure daily and record.  Sit still and quiet for 5 mins while deep breathing before checking your blood pressure.  Goal for your blood pressure is <130 on top and <85 on the bottom.  If your BP is not at this level after 2 weeks, increase to a whole tablet.

## 2023-01-28 NOTE — Progress Notes (Signed)
 Valsartan 40mg  is to strong per patient. Patient said that sh e is waking up with dry mouth and headache  Patient slip on her first step  of apartment complex 01/27/2022 that had ice on it . Patient c/o right buttock pain  and left thumb pain 7/10

## 2023-01-28 NOTE — Progress Notes (Signed)
 Patient ID: Michial VEAR Counter, female   DOB: [birth year], 53 y.o.   MRN: [id number]   Zanetta Dehaan, is a 53 y.o. female  RDW:[id number]  FMW:[id number]  DOB - July 17, 1970  Chief Complaint  Patient presents with   Hypertension   Fall       Subjective:   Meribeth Mealy is a 53 y.o. female here today for BP recheck but ended up falling on the ice outside her apartment complex.  She was having morning thirst and HA while taking valsartan  so she stopped it.  She has not been checking BP.    As she was leaving to come here today, she fell.  She was leaving her apartment when she grabbed onto the rail with her L hand and slipped on black ice on the steps and fell and landed on her R buttocks in addition to her hand getting twisted and fell down the flight of stairs.  She said her apartment did nothing about the ice. She did not hit her head.  No LOC.  Complains of R hip and buttocks pain and into the lower back and L hand and thumb pain. No numbness of weakness.        No problems updated.  ALLERGIES: Allergies  Allergen Reactions   Aspirin Other (See Comments)    HEADACHE, DIZZINESS   Atenolol Other (See Comments)    HEADACHES DIZZINESS    PAST MEDICAL HISTORY: Past Medical History:  Diagnosis Date   Anxiety    NO RECENT ANXIETY ATTACKS PER PT ON 10-04-2020   Arthritis    OA LEFT KNEE   Claustrophobia 10/04/2020   COVID-19 2020   COUGH SOB X 6 WEEKS ALL SYMPTOMS RESOLVED   GERD (gastroesophageal reflux disease)    Headache    Hypertension    not currently on medications   Wears glasses 10/04/2020    MEDICATIONS AT HOME: Prior to Admission medications   Medication Sig Start Date End Date Taking? Authorizing Provider  methocarbamol  (ROBAXIN ) 500 MG tablet Take 2 tablets (1,000 mg total) by mouth 4 (four) times daily. Prn muscle spasm 01/28/23  Yes Danton Jon HERO, PA-C  acetaminophen  (TYLENOL ) 500 MG tablet Take 1 tablet (500 mg total) by mouth every 6 (six) hours as  needed. Patient not taking: Reported on 01/28/2023 01/05/22   [name], NP  gabapentin  (NEURONTIN ) 300 MG capsule Take 1 capsule (300 mg total) by mouth at bedtime as needed. Patient not taking: Reported on 01/28/2023 12/14/22   [name], NP  hydrOXYzine  (ATARAX /VISTARIL ) 25 MG tablet Take 1 tablet (25 mg total) by mouth every 6 (six) hours as needed for anxiety. Patient not taking: Reported on 01/28/2023 10/12/20   Ervin, Michael L, MD  ibuprofen  (ADVIL ) 800 MG tablet Take 1 tablet (800 mg total) by mouth 3 (three) times daily. Patient not taking: Reported on 01/28/2023 10/12/20   Ervin, Michael L, MD  meloxicam  (MOBIC ) 15 MG tablet Take 1 tablet (15 mg total) by mouth daily. 01/28/23 01/28/24  Candice Tobey M, PA-C  metroNIDAZOLE  (METROGEL ) 0.75 % vaginal gel Place 1 Applicatorful vaginally at bedtime. Apply one applicatorful to vagina at bedtime for 5 days Patient not taking: Reported on 01/28/2023 11/21/20   Ervin, Michael L, MD  omeprazole  (PRILOSEC) 20 MG capsule Take 20 mg by mouth as needed. Patient not taking: Reported on 01/28/2023    [provider]  valsartan  (DIOVAN ) 40 MG tablet Take 1 tablet (40 mg total) by mouth daily. 01/28/23   Tatianna Ibbotson,  Jon HERO, PA-C    ROS: Neg HEENT Neg resp Neg cardiac Neg GI Neg GU Neg psych Neg neuro  Objective:   Vitals:   01/28/23 1005 01/28/23 1021  BP: (!) 157/96 (!) 144/93  Pulse: 87   Resp: 16   Temp: 98.1 F (36.7 C)   TempSrc: Oral   SpO2: 99%   Weight: 279 lb (126.6 kg)    Exam General appearance : Awake, alert, not in any distress. Speech Clear. Not toxic looking HEENT: Atraumatic and Normocephalic Neck: Supple, no JVD. No cervical lymphadenopathy.  Chest: Good air entry bilaterally, CTAB.  No rales/rhonchi/wheezing CVS: S1 S2 regular, no murmurs.  TTP in coccyx and R S-I joint.  ROM limited by obesity but otherwise normal with back and hip.  Ambulates favoring L side but without assistance.   L hand-there is mild  swelling over the dorsum of the L thumb and it is beginning to show a bruise.  ROM limited due to pain.  No snuffbox TTP currently and all is N-V intact Extremities: B/L Lower Ext shows no edema, both legs are warm to touch Neurology: Awake alert, and oriented X 3, CN II-XII intact, Non focal Skin: No Rash  Data Review Lab Results  Component Value Date   HGBA1C 5.6 10/18/2021   HGBA1C 5.7 (H) 05/30/2020   HGBA1C CANCELED 05/25/2020    Assessment & Plan   1. Contusion of right hip, initial encounter (Primary) - meloxicam  (MOBIC ) 15 MG tablet; Take 1 tablet (15 mg total) by mouth daily.  Dispense: 30 tablet; Refill: 2 - methocarbamol  (ROBAXIN ) 500 MG tablet; Take 2 tablets (1,000 mg total) by mouth 4 (four) times daily. Prn muscle spasm  Dispense: 90 tablet; Refill: 1 -refer to ortho for all  2. Contusion of left hand, initial encounter -refer to ortho for all - meloxicam  (MOBIC ) 15 MG tablet; Take 1 tablet (15 mg total) by mouth daily.  Dispense: 30 tablet; Refill: 2 - methocarbamol  (ROBAXIN ) 500 MG tablet; Take 2 tablets (1,000 mg total) by mouth 4 (four) times daily. Prn muscle spasm  Dispense: 90 tablet; Refill: 1  3. Contusion of left thumb without damage to nail, initial encounter -refer to ortho for all - meloxicam  (MOBIC ) 15 MG tablet; Take 1 tablet (15 mg total) by mouth daily.  Dispense: 30 tablet; Refill: 2 - methocarbamol  (ROBAXIN ) 500 MG tablet; Take 2 tablets (1,000 mg total) by mouth 4 (four) times daily. Prn muscle spasm  Dispense: 90 tablet; Refill: 1  4. Contusion of lower back, initial encounter -refer to ortho for all - meloxicam  (MOBIC ) 15 MG tablet; Take 1 tablet (15 mg total) by mouth daily.  Dispense: 30 tablet; Refill: 2 - methocarbamol  (ROBAXIN ) 500 MG tablet; Take 2 tablets (1,000 mg total) by mouth 4 (four) times daily. Prn muscle spasm  Dispense: 90 tablet; Refill: 1  5. Fall on ice -refer to ortho for all - DG Hand Complete Left; Future - DG Lumbar  Spine Complete; Future - meloxicam  (MOBIC ) 15 MG tablet; Take 1 tablet (15 mg total) by mouth daily.  Dispense: 30 tablet; Refill: 2 - DG Finger Thumb Left; Future - DG Hip Unilat W OR W/O Pelvis 2-3 Views Right; Future - methocarbamol  (ROBAXIN ) 500 MG tablet; Take 2 tablets (1,000 mg total) by mouth 4 (four) times daily. Prn muscle spasm  Dispense: 90 tablet; Refill: 1  6. Primary hypertension Take 1/2 valsartan  daily and drink 64 to 80 ounces water to offset any side effects.  Check blood  pressure daily and record.  Sit still and quiet for 5 mins while deep breathing before checking your blood pressure.  Goal for your blood pressure is <130 on top and <85 on the bottom.  If your BP is not at this level after 2 weeks, increase to a whole tablet.   - valsartan  (DIOVAN ) 40 MG tablet; Take 1 tablet (40 mg total) by mouth daily.  Dispense: 30 tablet; Refill: 1  7. Side effect of medication Hopefully new plan will eliminate the SE.    Return in about 2 weeks (around 02/11/2023) for PCP for chronic conditions-htn.  The patient was given clear instructions to go to ER or return to medical center if symptoms don't improve, worsen or new problems develop. The patient verbalized understanding. The patient was told to call to get lab results if they haven't heard anything in the next week.      Jon Moores, PA-C [facility] and [facility] [place], [place] [phone]   01/28/2023, 10:35 AM

## 2023-01-29 ENCOUNTER — Ambulatory Visit: Payer: Commercial Managed Care - PPO

## 2023-01-30 ENCOUNTER — Encounter: Payer: Self-pay | Admitting: Physician Assistant

## 2023-01-30 ENCOUNTER — Ambulatory Visit (INDEPENDENT_AMBULATORY_CARE_PROVIDER_SITE_OTHER): Payer: Commercial Managed Care - PPO | Admitting: Physician Assistant

## 2023-01-30 DIAGNOSIS — M25551 Pain in right hip: Secondary | ICD-10-CM

## 2023-01-30 DIAGNOSIS — M79642 Pain in left hand: Secondary | ICD-10-CM | POA: Diagnosis not present

## 2023-01-30 NOTE — Progress Notes (Signed)
 Office Visit Note   Patient: Kristine Kelly           Date of Birth: 07-19-1970           MRN: [id number] Visit Date: 01/30/2023              Requested by: [name], NP [address] [place],  [place] [address] PCP: [name], NP  Chief Complaint  Patient presents with   Left Hand - Pain   Lower Back - Pain      HPI: Lenward is a pleasant 53 year old woman who have seen before for back pain.  Unfortunately 2 days ago she was walking out of her apartment complex and slipped on black ice falling down about 7-8 stairs.  She denies any loss of consciousness but complained of right buttock pain right hand pain and right back pain.  She is currently on Mobic  and a muscle relaxer.  She has been doing PT for her back and was getting better and now she feels like she is gone back few steps.  Denies any loss of bowel or bladder control  Assessment & Plan:   Plan: isit Diagnoses: I reviewed her x-rays cannot see any acute fractures.  This only happened 2 days ago.  As far as her thumb is concerned she does have a thumb sprain but is able to oppose her fingers that she is quite painful.  Will put her in a thumb spica splint.  She is due to go to therapy for her back this week I think that would be fine she may not be able to do a lot but hopefully they can work with her some of the modalities.  The right posterior buttock pain could even be a torn hamstring.  Follow-Up Instructions: No follow-ups on file.   Ortho Exam  Patient is alert, oriented, no adenopathy, well-dressed, normal affect, normal respiratory effort. Examination of her left hand she is neurovascular intact she has some mild soft tissue swelling about the thumb.  She is able to oppose all of her fingers.  She has difficulty with abduction of the thumb secondary to pain.  Not difficulties with extension. Examination of the right posterior buttock she has bruising towards the area of the proximal insertion of  the hamstring no tenderness in her groin no tenderness or step-offs in her spine manipulate her hip without difficulty her compartments are soft and nontender she is neurovascular intact  Imaging: No results found. No images are attached to the encounter.  Labs: Lab Results  Component Value Date   HGBA1C 5.6 10/18/2021   HGBA1C 5.7 (H) 05/30/2020   HGBA1C CANCELED 05/25/2020   ESRSEDRATE 33 (H) 11/13/2022     Lab Results  Component Value Date   ALBUMIN 4.3 08/22/2020   ALBUMIN 4.0 03/26/2020   ALBUMIN 4.1 12/18/2019    No results found for: MG No results found for: VD25OH  No results found for: PREALBUMIN    Latest Ref Rng & Units 10/11/2020   11:14 PM 10/07/2020    3:02 PM 08/22/2020    7:53 AM  CBC EXTENDED  WBC 4.0 - 10.5 K/uL 11.6  7.6  6.7   RBC 3.87 - 5.11 MIL/uL 4.03  4.46  4.85   Hemoglobin 12.0 - 15.0 g/dL 88.0  86.8  85.7   HCT 36.0 - 46.0 % 35.3  40.0  42.6   Platelets 150 - 400 K/uL 198  218  231   NEUT#  1.7 - 7.7 K/uL   5.0   Lymph# 0.7 - 4.0 K/uL   1.2      There is no height or weight on file to calculate BMI.  Orders:  No orders of the defined types were placed in this encounter.  No orders of the defined types were placed in this encounter.    Procedures: No procedures performed  Clinical Data: No additional findings.  ROS:  All other systems negative, except as noted in the HPI. Review of Systems  Objective: Vital Signs: LMP 09/22/2020   Specialty Comments:  Tobie Julaine Moons, MD - 12/24/2022  Formatting of this note might be different from the original.  CLINICAL DATA:  Back pain, T12 compression fracture.   EXAM:  MRI THORACIC SPINE WITHOUT CONTRAST   TECHNIQUE:  Multiplanar, multisequence MR imaging of the thoracic spine was  performed. No intravenous contrast was administered.   COMPARISON:  None Available.   FINDINGS:  Alignment: Physiologic.   Vertebrae: No acute fracture, evidence of discitis, or aggressive   bone lesion. Small hemangioma in the T5 vertebral body.   Cord:  Normal signal and morphology.   Paraspinal and other soft tissues: No acute paraspinal abnormality.   Disc levels:   Disc spaces:  Disc spaces are maintained.   Degenerative disease with mild disc height loss at T5-6, T6-7, T7-8  and T10-11.   T1-T2: No disc protrusion, foraminal stenosis or central canal  stenosis.   T2-T3: No disc protrusion, foraminal stenosis or central canal  stenosis.   T3-T4: No disc protrusion, foraminal stenosis or central canal  stenosis.   T4-T5: No disc protrusion, foraminal stenosis or central canal  stenosis.   T5-T6: No disc protrusion, foraminal stenosis or central canal  stenosis.   T6-T7: Moderate right paracentral disc protrusion with inferior  migration of disc material contacting the ventral cervical spinal  cord. No foraminal or central canal stenosis.   T7-T8: No disc protrusion, foraminal stenosis or central canal  stenosis.   T8-T9: Small central disc protrusion. No foraminal or central canal  stenosis.   T9-T10: No disc protrusion, foraminal stenosis or central canal  stenosis.   T10-T11: No disc protrusion, foraminal stenosis or central canal  stenosis.   T11-T12: No disc protrusion, foraminal stenosis or central canal  stenosis.   IMPRESSION:  1. No acute osseous injury of the thoracic spine.  2. At T6-7 there is a moderate right paracentral disc protrusion  with inferior migration of disc material contacting the ventral  cervical spinal cord.  3. At T8-9 there is a small central disc protrusion.  4. No foraminal or central canal stenosis of the thoracic spine.    Electronically Signed    By: Julaine Tobie M.D.    On: 12/24/2022 14:04  PMFS History: Patient Active Problem List   Diagnosis Date Noted   Osteoarthritis, multiple sites 11/12/2022   Pain in right hip 11/12/2022   Numbness and tingling 11/12/2022   Post-operative state 11/21/2020    H/O vaginal hysterectomy 11/21/2020   Vaginal discharge 10/05/2020   Menopausal symptoms 09/01/2020   Dermatophytosis 05/25/2020   Obesity 04/28/2018   Anxiety and depression 10/04/2016   Past Medical History:  Diagnosis Date   Anxiety    NO RECENT ANXIETY ATTACKS PER PT ON 10-04-2020   Arthritis    OA LEFT KNEE   Claustrophobia 10/04/2020   COVID-19 2020   COUGH SOB X 6 WEEKS ALL SYMPTOMS RESOLVED   GERD (gastroesophageal reflux disease)  Headache    Hypertension    not currently on medications   Wears glasses 10/04/2020    Family History  Problem Relation Age of Onset   Asthma Mother     Past Surgical History:  Procedure Laterality Date   BREAST LUMPECTOMY Right 1991   BENIGN   CHOLECYSTECTOMY  2006   LAPAROSCOPIC   DILATATION AND CURETTAGE/HYSTEROSCOPY WITH MINERVA N/A 07/30/2018   Procedure: DILATATION AND CURETTAGE/HYSTEROSCOPY WITH MINERVA;  Surgeon: Starla Harland BROCKS, MD;  Location: [facility];  Service: Gynecology;  Laterality: N/A;   TUBAL LIGATION     21 YRS AGO PER PT ON 10-04-2020   VAGINAL HYSTERECTOMY  10/11/2020   Procedure: TOTAL HYSTERECTOMY VAGINAL with RIGHT SALPINGECTOMY;  Surgeon: Lorence Ozell CROME, MD;  Location: [facility];  Service: Gynecology;;   Social History   Occupational History   Not on file  Tobacco Use   Smoking status: Never    Passive exposure: Never   Smokeless tobacco: Never  Vaping Use   Vaping status: Never Used  Substance and Sexual Activity   Alcohol use: No   Drug use: No   Sexual activity: Yes    Birth control/protection: Surgical

## 2023-02-01 ENCOUNTER — Ambulatory Visit: Payer: Commercial Managed Care - PPO | Admitting: Physical Therapy

## 2023-02-01 ENCOUNTER — Other Ambulatory Visit: Payer: Self-pay | Admitting: Family

## 2023-02-01 ENCOUNTER — Encounter: Payer: Self-pay | Admitting: Physical Therapy

## 2023-02-01 ENCOUNTER — Telehealth: Payer: Self-pay

## 2023-02-01 DIAGNOSIS — M25532 Pain in left wrist: Secondary | ICD-10-CM

## 2023-02-01 DIAGNOSIS — M546 Pain in thoracic spine: Secondary | ICD-10-CM | POA: Diagnosis not present

## 2023-02-01 DIAGNOSIS — M6281 Muscle weakness (generalized): Secondary | ICD-10-CM

## 2023-02-01 NOTE — Telephone Encounter (Signed)
-----   Message from Georgian Co sent at 01/31/2023  5:41 PM EST ----- Thumb xray was normal/no fracture.  Thanks, Georgian Co, PA-C

## 2023-02-01 NOTE — Telephone Encounter (Signed)
 Pt was called and vm was left, Information has been sent to nurse pool.

## 2023-02-01 NOTE — Therapy (Signed)
 OUTPATIENT PHYSICAL THERAPY THORACOLUMBAR TREATMENT    Patient Name: Kristine Kelly MRN: [id number] DOB:July 03, 1970, 53 y.o., female Today's Date: 02/01/2023  END OF SESSION:  PT End of Session - 02/01/23 1231     Visit Number 4    Number of Visits 13    Date for PT Re-Evaluation 02/21/23    Authorization Type [employer]/ [employer]    PT Start Time 1230    PT Stop Time 1300    PT Time Calculation (min) 30 min               Past Medical History:  Diagnosis Date   Anxiety    NO RECENT ANXIETY ATTACKS PER PT ON 10-04-2020   Arthritis    OA LEFT KNEE   Claustrophobia 10/04/2020   COVID-19 2020   COUGH SOB X 6 WEEKS ALL SYMPTOMS RESOLVED   GERD (gastroesophageal reflux disease)    Headache    Hypertension    not currently on medications   Wears glasses 10/04/2020   Past Surgical History:  Procedure Laterality Date   BREAST LUMPECTOMY Right 1991   BENIGN   CHOLECYSTECTOMY  2006   LAPAROSCOPIC   DILATATION AND CURETTAGE/HYSTEROSCOPY WITH MINERVA N/A 07/30/2018   Procedure: DILATATION AND CURETTAGE/HYSTEROSCOPY WITH MINERVA;  Surgeon: Starla Harland BROCKS, MD;  Location: [facility];  Service: Gynecology;  Laterality: N/A;   TUBAL LIGATION     21 YRS AGO PER PT ON 10-04-2020   VAGINAL HYSTERECTOMY  10/11/2020   Procedure: TOTAL HYSTERECTOMY VAGINAL with RIGHT SALPINGECTOMY;  Surgeon: Lorence Ozell CROME, MD;  Location: [facility];  Service: Gynecology;;   Patient Active Problem List   Diagnosis Date Noted   Osteoarthritis, multiple sites 11/12/2022   Pain in right hip 11/12/2022   Numbness and tingling 11/12/2022   Post-operative state 11/21/2020   H/O vaginal hysterectomy 11/21/2020   Vaginal discharge 10/05/2020   Menopausal symptoms 09/01/2020   Dermatophytosis 05/25/2020   Obesity 04/28/2018   Anxiety and depression 10/04/2016    PCP: [name], NP  REFERRING PROVIDER: Persons, [name], PA   REFERRING DIAG: M54.6 (ICD-10-CM) - Pain  in thoracic spine   Rationale for Evaluation and Treatment: Rehabilitation  THERAPY DIAG:  Pain in thoracic spine  Muscle weakness (generalized)  ONSET DATE: October 2024  SUBJECTIVE:                                                                                                                                                                                           SUBJECTIVE STATEMENT: I fell on the ice Monday (4 days ago) , slipped on the  stairs and landed on my but, all bruised up. Have a thumb sprain. Still having right sided thoracic to lumbar pain.   EVAL: Pt presents to PT with roughly 2 month hx of R sided thoracic back pain after lifting a box of soda while at work. She also promotes bilateral LE pain and N/T, denies bowel/bladder changes or saddle anesthesia. Lying on her R side greatly increases pain, also notes that she can't reach with her R UE without increasing pain, she is R hand dominant.   PERTINENT HISTORY:  HTN, see other PMH  PAIN:  Are you having pain?  Yes: NPRS scale: 7/10 Worst: 10/10 Pain location: R sided thoracic Pain description: sharp, N/T Aggravating factors: lying on R side Relieving factors: none  PRECAUTIONS: None  RED FLAGS: None   WEIGHT BEARING RESTRICTIONS: No  FALLS:  Has patient fallen in last 6 months? No  LIVING ENVIRONMENT: Lives with: lives alone Lives in: House/apartment  OCCUPATION: International Aid/development Worker [employer]   PLOF: Independent  PATIENT GOALS: decrease her back pain, improve her strength  NEXT MD VISIT: As needed  OBJECTIVE:  Note: Objective measures were completed at Evaluation unless otherwise noted.  DIAGNOSTIC FINDINGS:  Formatting of this note might be different from the original. CLINICAL DATA: Back pain, T12 compression fracture.  EXAM: MRI THORACIC SPINE WITHOUT CONTRAST  TECHNIQUE: Multiplanar, multisequence MR imaging of the thoracic spine was performed. No intravenous contrast was  administered.  COMPARISON: None Available.  FINDINGS: Alignment: Physiologic.  Vertebrae: No acute fracture, evidence of discitis, or aggressive bone lesion. Small hemangioma in the T5 vertebral body.  Cord: Normal signal and morphology.  Paraspinal and other soft tissues: No acute paraspinal abnormality.  Disc levels:  Disc spaces: Disc spaces are maintained.  Degenerative disease with mild disc height loss at T5-6, T6-7, T7-8 and T10-11.  T1-T2: No disc protrusion, foraminal stenosis or central canal stenosis.  T2-T3: No disc protrusion, foraminal stenosis or central canal stenosis.  T3-T4: No disc protrusion, foraminal stenosis or central canal stenosis.  T4-T5: No disc protrusion, foraminal stenosis or central canal stenosis.  T5-T6: No disc protrusion, foraminal stenosis or central canal stenosis.  T6-T7: Moderate right paracentral disc protrusion with inferior migration of disc material contacting the ventral cervical spinal cord. No foraminal or central canal stenosis.  T7-T8: No disc protrusion, foraminal stenosis or central canal stenosis.  T8-T9: Small central disc protrusion. No foraminal or central canal stenosis.  T9-T10: No disc protrusion, foraminal stenosis or central canal stenosis.  T10-T11: No disc protrusion, foraminal stenosis or central canal stenosis.  T11-T12: No disc protrusion, foraminal stenosis or central canal stenosis.  IMPRESSION: 1. No acute osseous injury of the thoracic spine. 2. At T6-7 there is a moderate right paracentral disc protrusion with inferior migration of disc material contacting the ventral cervical spinal cord. 3. At T8-9 there is a small central disc protrusion. 4. No foraminal or central canal stenosis of the thoracic spine.   PATIENT SURVEYS:  FOTO: 44% function; 62% predicted  COGNITION: Overall cognitive status: Within functional limits for tasks assessed     SENSATION: WFL  POSTURE: rounded  shoulders and forward head  PALPATION: TTP to R rhomboids, R latissimus   THORACIC/LUMBAR ROM:   AROM eval  Flexion WFL  Extension 50% reudced  Right lateral flexion   Left lateral flexion   Right rotation 25% reduced with pain  Left rotation 25% reduced with pain   (Blank rows = not tested)  UPPER EXTREMITY MMT:  MMT  Right eval Left eval  Shoulder flexion 3+/5 5/5  Shoulder abduction    Shoulder IR    Shoulder ER 3/5 4+/5  Shoulder extension    Upper trap    Middle trap    Lower trap    Elbow flexion     Elbow extension    Wrist flexion    Wrist extension    Grip     (Blank rows = not tested)  GAIT: Distance walked: 81ft Assistive device utilized: None Level of assistance: Complete Independence Comments: no overt deviations noted  [facility] Adult PT Treatment:                                                DATE: 02/01/23 Therapeutic Exercise: Seated lumbar flexion  Seated trunk rotations Seated hamstring stretch - foot on stool Supine march Supine Clam AROM Supine dow flexion 2x10  Supine dow chest press 2x10 Wide LTR, narrow LTR Supine horizontal abd 2x10 RTB Supine Bilat ER 2 x 10 RTB     [facility] Adult PT Treatment:                                                DATE: 01/24/23 Therapeutic Exercise: Nustep L5 UE/LE x 4 minutes UE/LE LTR x 5  Supine PPT x 10 - 5 hold Supine PPT with ball x 10 - 5 sec Supine horizontal abd 2x10 GTB Supine clamshell 2x15 GTB Supine dow flexion 2x10  Supine dow chest press 2x10 Seated bilateral ER 2x10 RTB Standing row 2x10 GTB Seated thoracic extension over towel x 10  [facility] Adult PT Treatment:                                                DATE: 01/18/23 Therapeutic Exercise: Nustep L2 UE/LE x 5 minutes UE/LE  Seated scap 5 sec  x 8  Seated Nerve tensioner  Seated upper trap stretch 20 sec 2  x each -  Seated bilateral ER 6 x 2 YTB LTR small ROM x 4 each way  Supine bilateral shoulder flexion 6 x 2 Supine cross  body stretch   Supine sciatic nerve tensioner  Self Care: Posture  Pacing with therex  Breathing/relaxation  [facility] Adult PT Treatment:                                                DATE: 01/10/2023 Therapeutic Exercise: Seated sciatic nerve glide x 10  Upper trap stretch x 60 L Seated scapular retractions x 10 Cross arm stretch x 30 Seated biateral ER x 10 YTB                              PATIENT EDUCATION:  Education details: 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 Person educated: Patient Education method: Explanation, Demonstration, and Handouts Education comprehension: verbalized understanding and returned demonstration  HOME EXERCISE PROGRAM: Access Code: [email or web address] URL: [email or web address] Date: 01/10/2023  Prepared by: Alm Kingdom  Exercises - Seated Sciatic Tensioner  - 1 x daily - 7 x weekly - 2 sets - 15 reps - Seated Upper Trapezius Stretch  - 1 x daily - 7 x weekly - 2 reps - 30 sec hold - Seated Scapular Retraction  - 1 x daily - 7 x weekly - 3 sets - 10 reps - Standing Shoulder Posterior Capsule Stretch  - 1 x daily - 7 x weekly - 2 reps - 30 sec hold - Shoulder External Rotation and Scapular Retraction with Resistance  - 1 x daily - 7 x weekly - 2 sets - 10 reps - yellow band hold 01/18/23 - Supine Lower Trunk Rotation  - 1 x daily - 7 x weekly - 2 sets - 10 reps - Supine shoulder Flexion Extension AAROM with Dowel to comfortable height overhead  - 1 x daily - 7 x weekly - 2 sets - 10 reps  ASSESSMENT:  CLINICAL IMPRESSION: Pt tolerated treatment fair but was limited by continued pain in R thoracic to R hip which is exacerbated today due to fall 4 days ago when she slipped on ice going down the stairs. Therapy focused on light strengthening in order to decrease pain and improve comfort. She did report improvement in her back pain after session. Will continue to progress per POC.   EVAL: Patient is a 53 y.o. F who was seen today for physical  therapy evaluation and treatment for subacute R sided thoracic pain. Physical findings are consistent with referring provider impression, as pt demonstrates decrease in thoracic ROM and TTP with R periscapular musculature. Unable to rule out possibility of thoracolumbar muscle strain. FOTO score demonstrates decrease in subjective functional ability below PLOF. Pt would benefit from skilled PT services, will progress as able per POC.     OBJECTIVE IMPAIRMENTS: decreased activity tolerance, decreased mobility, difficulty walking, decreased ROM, decreased strength, impaired UE functional use, postural dysfunction, and pain   ACTIVITY LIMITATIONS: carrying, lifting, bending, dressing, self feeding, and reach over head  PARTICIPATION LIMITATIONS: meal prep, cleaning, driving, shopping, community activity, occupation, and yard work  PERSONAL FACTORS: 1 comorbidity: HTN  are also affecting patient's functional outcome.   REHAB POTENTIAL: Excellent  CLINICAL DECISION MAKING: Stable/uncomplicated  EVALUATION COMPLEXITY: Low   GOALS: Goals reviewed with patient? No  SHORT TERM GOALS: Target date: 01/31/2023   Pt will be compliant and knowledgeable with initial HEP for improved comfort and carryover Baseline: initial HEP given  Goal status: INITIAL  2.  Pt will self report right sided thoracic pain pain no greater than 6/10 for improved comfort and functional ability Baseline: 10/10 at worst Goal status: INITIAL   LONG TERM GOALS: Target date: 02/21/2023   Pt will improve FOTO function score to no less than 62% as proxy for functional improvement Baseline: 44% function Goal status: INITIAL   2.  Pt will self report right sided thoracic pain pain no greater than 3/10 for improved comfort and functional ability Baseline: 10/10 at worst Goal status: INITIAL   3.  Pt will increase R ER MMT to no less than 4/5 for improved comfort and functional ability with work and other ADLs Baseline: 3/5   Goal status: INITIAL   4.  Pt will be able to reach overhead not limited by pain in mid back for improved comfort with work related duites  Baseline: unable Goal status: INITIAL   PLAN:  PT FREQUENCY: 1-2x/week  PT DURATION: 6 weeks  PLANNED INTERVENTIONS:  02835- PT Re-evaluation, 97110-Therapeutic exercises, 97530- Therapeutic activity, [id number]- Neuromuscular re-education, 97535- Self Care, 97140- Manual therapy, [id number]- Aquatic Therapy, 97014- Electrical stimulation (unattended), [id number]- Electrical stimulation (manual), 97016- Vasopneumatic device, Dry Needling, Cryotherapy, and Moist heat  PLAN FOR NEXT SESSION: assess HEP response, periscapular strengthening, STM to periscapular musculature   For all possible CPT codes, reference the Planned Interventions line above.     Check all conditions that are expected to impact treatment: {Conditions expected to impact treatment:Musculoskeletal disorders   If treatment provided at initial evaluation, no treatment charged due to lack of authorization.       Harlene Persons, PTA 02/01/23 1:02 PM Phone: [phone] Fax: [phone]

## 2023-02-05 ENCOUNTER — Ambulatory Visit: Payer: Commercial Managed Care - PPO

## 2023-02-08 ENCOUNTER — Encounter: Payer: Self-pay | Admitting: Family

## 2023-02-08 ENCOUNTER — Ambulatory Visit: Payer: Commercial Managed Care - PPO | Admitting: Physical Therapy

## 2023-02-08 ENCOUNTER — Ambulatory Visit (INDEPENDENT_AMBULATORY_CARE_PROVIDER_SITE_OTHER): Payer: Commercial Managed Care - PPO | Admitting: Family

## 2023-02-08 ENCOUNTER — Encounter: Payer: Self-pay | Admitting: Physical Therapy

## 2023-02-08 VITALS — BP 132/85 | HR 78 | Temp 97.9°F | Ht 67.0 in | Wt 279.8 lb

## 2023-02-08 DIAGNOSIS — M546 Pain in thoracic spine: Secondary | ICD-10-CM | POA: Diagnosis not present

## 2023-02-08 DIAGNOSIS — M6281 Muscle weakness (generalized): Secondary | ICD-10-CM

## 2023-02-08 DIAGNOSIS — I10 Essential (primary) hypertension: Secondary | ICD-10-CM | POA: Diagnosis not present

## 2023-02-08 NOTE — Therapy (Signed)
OUTPATIENT PHYSICAL THERAPY THORACOLUMBAR TREATMENT    Patient Name: Kristine Kelly MRN: [id number] DOB:Jun 28, 1970, 53 y.o., female Today's Date: 02/08/2023  END OF SESSION:  PT End of Session - 02/08/23 1256     Visit Number 5    Number of Visits 13    Date for PT Re-Evaluation 02/21/23    Authorization Type [employer]/ [employer]    Authorization Time Period 01/24/23-03/24/23    Authorization - Visit Number 3    Authorization - Number of Visits 7    PT Start Time 1300    PT Stop Time 1340    PT Time Calculation (min) 40 min               Past Medical History:  Diagnosis Date   Anxiety    NO RECENT ANXIETY ATTACKS PER PT ON 10-04-2020   Arthritis    OA LEFT KNEE   Claustrophobia 10/04/2020   COVID-19 2020   COUGH SOB X 6 WEEKS ALL SYMPTOMS RESOLVED   GERD (gastroesophageal reflux disease)    Headache    Hypertension    not currently on medications   Wears glasses 10/04/2020   Past Surgical History:  Procedure Laterality Date   BREAST LUMPECTOMY Right 1991   BENIGN   CHOLECYSTECTOMY  2006   LAPAROSCOPIC   DILATATION AND CURETTAGE/HYSTEROSCOPY WITH MINERVA N/A 07/30/2018   Procedure: DILATATION AND CURETTAGE/HYSTEROSCOPY WITH MINERVA;  Surgeon: Allie Bossier, MD;  Location: [facility];  Service: Gynecology;  Laterality: N/A;   TUBAL LIGATION     21 YRS AGO PER PT ON 10-04-2020   VAGINAL HYSTERECTOMY  10/11/2020   Procedure: TOTAL HYSTERECTOMY VAGINAL with RIGHT SALPINGECTOMY;  Surgeon: Hermina Staggers, MD;  Location: [facility];  Service: Gynecology;;   Patient Active Problem List   Diagnosis Date Noted   Osteoarthritis, multiple sites 11/12/2022   Pain in right hip 11/12/2022   Numbness and tingling 11/12/2022   Post-operative state 11/21/2020   H/O vaginal hysterectomy 11/21/2020   Vaginal discharge 10/05/2020   Menopausal symptoms 09/01/2020   Dermatophytosis 05/25/2020   Obesity 04/28/2018   Anxiety and depression 10/04/2016     PCP: Rema Fendt, NP  REFERRING PROVIDER: Persons, [place], [place]   REFERRING DIAG: M54.6 (ICD-10-CM) - Pain in thoracic spine   Rationale for Evaluation and Treatment: Rehabilitation  THERAPY DIAG:  Pain in thoracic spine  Muscle weakness (generalized)  ONSET DATE: October 2024  SUBJECTIVE:  SUBJECTIVE STATEMENT: I am doing better since I fell. The thoracic pain reaches 4-5/10. I am still bruised on right buttock and pain in right hip.    02/01/23: I fell on the ice Monday (4 days ago) , slipped on the stairs and landed on my but, all bruised up. Have a thumb sprain. Still having right sided thoracic to lumbar pain.   EVAL: Pt presents to PT with roughly 2 month hx of R sided thoracic back pain after lifting a box of soda while at work. She also promotes bilateral LE pain and N/T, denies bowel/bladder changes or saddle anesthesia. Lying on her R side greatly increases pain, also notes that she can't reach with her R UE without increasing pain, she is R hand dominant.   PERTINENT HISTORY:  HTN, see other PMH  PAIN:  Are you having pain?  Yes: NPRS scale: 7/10 Worst: 10/10 Pain location: R sided thoracic Pain description: sharp, N/T Aggravating factors: lying on R side Relieving factors: none  PRECAUTIONS: None  RED FLAGS: None   WEIGHT BEARING RESTRICTIONS: No  FALLS:  Has patient fallen in last 6 months? No  LIVING ENVIRONMENT: Lives with: lives alone Lives in: House/apartment  OCCUPATION: International aid/development worker [place]   PLOF: Independent  PATIENT GOALS: decrease her back pain, improve her strength  NEXT MD VISIT: As needed  OBJECTIVE:  Note: Objective measures were completed at Evaluation unless otherwise noted.  DIAGNOSTIC FINDINGS:  Formatting of this note  might be different from the original. CLINICAL DATA: Back pain, T12 compression fracture.  EXAM: MRI THORACIC SPINE WITHOUT CONTRAST  TECHNIQUE: Multiplanar, multisequence MR imaging of the thoracic spine was performed. No intravenous contrast was administered.  COMPARISON: None Available.  FINDINGS: Alignment: Physiologic.  Vertebrae: No acute fracture, evidence of discitis, or aggressive bone lesion. Small hemangioma in the T5 vertebral body.  Cord: Normal signal and morphology.  Paraspinal and other soft tissues: No acute paraspinal abnormality.  Disc levels:  Disc spaces: Disc spaces are maintained.  Degenerative disease with mild disc height loss at T5-6, T6-7, T7-8 and T10-11.  T1-T2: No disc protrusion, foraminal stenosis or central canal stenosis.  T2-T3: No disc protrusion, foraminal stenosis or central canal stenosis.  T3-T4: No disc protrusion, foraminal stenosis or central canal stenosis.  T4-T5: No disc protrusion, foraminal stenosis or central canal stenosis.  T5-T6: No disc protrusion, foraminal stenosis or central canal stenosis.  T6-T7: Moderate right paracentral disc protrusion with inferior migration of disc material contacting the ventral cervical spinal cord. No foraminal or central canal stenosis.  T7-T8: No disc protrusion, foraminal stenosis or central canal stenosis.  T8-T9: Small central disc protrusion. No foraminal or central canal stenosis.  T9-T10: No disc protrusion, foraminal stenosis or central canal stenosis.  T10-T11: No disc protrusion, foraminal stenosis or central canal stenosis.  T11-T12: No disc protrusion, foraminal stenosis or central canal stenosis.  IMPRESSION: 1. No acute osseous injury of the thoracic spine. 2. At T6-7 there is a moderate right paracentral disc protrusion with inferior migration of disc material contacting the ventral cervical spinal cord. 3. At T8-9 there is a small central disc  protrusion. 4. No foraminal or central canal stenosis of the thoracic spine.   PATIENT SURVEYS:  FOTO: 44% function; 62% predicted 02/08/23: 46%   COGNITION: Overall cognitive status: Within functional limits for tasks assessed     SENSATION: WFL  POSTURE: rounded shoulders and forward head  PALPATION: TTP to R rhomboids, R latissimus   THORACIC/LUMBAR ROM:  AROM eval   Flexion WFL   Extension 50% reudced   Right lateral flexion    Left lateral flexion    Right rotation 25% reduced with pain   Left rotation 25% reduced with pain    (Blank rows = not tested)  UPPER EXTREMITY MMT:  MMT Right eval Left eval Right 02/08/23  Shoulder flexion 3+/5 5/5   Shoulder abduction     Shoulder IR     Shoulder ER 3/5 4+/5 4 pain  Shoulder extension     Upper trap     Middle trap     Lower trap     Elbow flexion      Elbow extension     Wrist flexion     Wrist extension     Grip      (Blank rows = not tested)  GAIT: Distance walked: 1ft Assistive device utilized: None Level of assistance: Complete Independence Comments: no overt deviations noted  [facility] Adult PT Treatment:                                                DATE: 02/08/23 Therapeutic Exercise: Nustep L4 UE/LE x 5 minutes  Seated lumbar flexion and rotation using exercise ball PPT 5 sec x 10  Supine march ab brace  Supine clam with blue band   Therapeutic Activity: Golfers pick up -less confident on right  Hip hinge with wide base -  cues mechanics     [facility] Adult PT Treatment:                                                DATE: 02/01/23 Therapeutic Exercise: Seated lumbar flexion  Seated trunk rotations Seated hamstring stretch - foot on stool Supine march Supine Clam AROM Supine dow flexion 2x10  Supine dow chest press 2x10 Wide LTR, narrow LTR Supine horizontal abd 2x10 RTB Supine Bilat ER 2 x 10 RTB     [facility] Adult PT Treatment:                                                DATE:  01/24/23 Therapeutic Exercise: Nustep L5 UE/LE x 4 minutes UE/LE LTR x 5  Supine PPT x 10 - 5" hold Supine PPT with ball x 10 - 5 sec Supine horizontal abd 2x10 GTB Supine clamshell 2x15 GTB Supine dow flexion 2x10  Supine dow chest press 2x10 Seated bilateral ER 2x10 RTB Standing row 2x10 GTB Seated thoracic extension over towel x 10  [facility] Adult PT Treatment:                                                DATE: 01/18/23 Therapeutic Exercise: Nustep L2 UE/LE x 5 minutes UE/LE  Seated scap 5 sec  x 8  Seated Nerve tensioner  Seated upper trap stretch 20 sec 2  x each -  Seated bilateral ER 6 x 2 YTB LTR small ROM x 4 each way  Supine bilateral shoulder flexion 6 x 2 Supine cross body stretch   Supine sciatic nerve tensioner  Self Care: Posture  Pacing with therex  Breathing/relaxation  [facility] Adult PT Treatment:                                                DATE: 01/10/2023 Therapeutic Exercise: Seated sciatic nerve glide x 10  Upper trap stretch x 60" L Seated scapular retractions x 10 Cross arm stretch x 30" Seated biateral ER x 10 YTB                              PATIENT EDUCATION:  Education details: tennis ball release to [employer] Person educated: Patient Education method: Explanation, Demonstration, and Handouts Education comprehension: verbalized understanding and returned demonstration  HOME EXERCISE PROGRAM: Access Code: [email or web address] URL: [email or web address] Date: 01/10/2023 Prepared by: Edwinna Areola  Exercises - Seated Sciatic Tensioner  - 1 x daily - 7 x weekly - 2 sets - 15 reps - Seated Upper Trapezius Stretch  - 1 x daily - 7 x weekly - 2 reps - 30 sec hold - Seated Scapular Retraction  - 1 x daily - 7 x weekly - 3 sets - 10 reps - Standing Shoulder Posterior Capsule Stretch  - 1 x daily - 7 x weekly - 2 reps - 30 sec hold - Shoulder External Rotation and Scapular Retraction with Resistance  - 1 x daily - 7 x weekly - 2 sets - 10 reps  - yellow band hold 01/18/23 - Supine Lower Trunk Rotation  - 1 x daily - 7 x weekly - 2 sets - 10 reps - Supine shoulder Flexion Extension AAROM with Dowel to comfortable height overhead  - 1 x daily - 7 x weekly - 2 sets - 10 reps  ASSESSMENT:  CLINICAL IMPRESSION: Pt reports improvement in symptoms since fall last week. Her pain levels are reaching 4-5/10 now instead of 10/10 as previous. STG# 2 met.  Has not completed HEP. Follows up with primary MD today, sees referring physician next week. Pt limited by pain with bending and lifting, avoids squat due to knee OA, worse on right. Began education on hip hinge. Pt demonstrates wide base and trunk twist to reach items on floor. Will continue to educate and instruct in appropriate mechanics. Could benefit from quad strengthening.  Will continue to progress per POC.   EVAL: Patient is a 53 y.o. F who was seen today for physical therapy evaluation and treatment for subacute R sided thoracic pain. Physical findings are consistent with referring provider impression, as pt demonstrates decrease in thoracic ROM and TTP with R periscapular musculature. Unable to rule out possibility of thoracolumbar muscle strain. FOTO score demonstrates decrease in subjective functional ability below PLOF. Pt would benefit from skilled PT services, will progress as able per POC.     OBJECTIVE IMPAIRMENTS: decreased activity tolerance, decreased mobility, difficulty walking, decreased ROM, decreased strength, impaired UE functional use, postural dysfunction, and pain   ACTIVITY LIMITATIONS: carrying, lifting, bending, dressing, self feeding, and reach over head  PARTICIPATION LIMITATIONS: meal prep, cleaning, driving, shopping, community activity, occupation, and yard work  PERSONAL FACTORS: 1 comorbidity: HTN  are also affecting patient's functional outcome.   REHAB POTENTIAL: Excellent  CLINICAL DECISION MAKING: Stable/uncomplicated  EVALUATION COMPLEXITY:  Low   GOALS: Goals reviewed with patient? No  SHORT TERM GOALS: Target date: 01/31/2023   Pt will be compliant and knowledgeable with initial HEP for improved comfort and carryover Baseline: initial HEP given  02/08/23: non compliant  Goal status: ONGOING  2.  Pt will self report right sided thoracic pain pain no greater than 6/10 for improved comfort and functional ability Baseline: 10/10 at worst 02/08/23: 4-5/10 Goal status: MET   LONG TERM GOALS: Target date: 02/21/2023   Pt will improve FOTO function score to no less than 62% as proxy for functional improvement Baseline: 44% function 02/08/23: 46%  Goal status: ONGOING    2.  Pt will self report right sided thoracic pain pain no greater than 3/10 for improved comfort and functional ability Baseline: 10/10 at worst 02/08/23: 4-5/10 Goal status: ONGOING   3.  Pt will increase R ER MMT to no less than 4/5 for improved comfort and functional ability with work and other ADLs Baseline: 3/5  Goal status: INITIAL   4.  Pt will be able to reach overhead not limited by pain in mid back for improved comfort with work related duites  Baseline: unable Goal status: INITIAL   PLAN:  PT FREQUENCY: 1-2x/week  PT DURATION: 6 weeks  PLANNED INTERVENTIONS: 97164- PT Re-evaluation, 97110-Therapeutic exercises, 97530- Therapeutic activity, [id number]- Neuromuscular re-education, 97535- Self Care, [id number]- Manual therapy, [id number]- Aquatic Therapy, 97014- Electrical stimulation (unattended), [id number]- Electrical stimulation (manual), 97016- Vasopneumatic device, Dry Needling, Cryotherapy, and Moist heat  PLAN FOR NEXT SESSION: assess HEP response, periscapular strengthening, STM to periscapular musculature   For all possible CPT codes, reference the Planned Interventions line above.     Check all conditions that are expected to impact treatment: {Conditions expected to impact treatment:Musculoskeletal disorders   If treatment provided at initial  evaluation, no treatment charged due to lack of authorization.       Jannette Spanner, PTA 02/08/23 1:46 PM Phone: [phone] Fax: [phone]

## 2023-02-08 NOTE — Progress Notes (Unsigned)
Patient states other Dr told her to break valsartan medication in half.

## 2023-02-08 NOTE — Progress Notes (Unsigned)
Patient ID: Kristine Kelly, female    DOB: 05/14/1970  MRN: [id number]  CC: Medical Management of Chronic Issues   Subjective: Kristine Kelly is a 53 y.o. female who presents for Her concerns today include: ***  Patient Active Problem List   Diagnosis Date Noted   Osteoarthritis, multiple sites 11/12/2022   Pain in right hip 11/12/2022   Numbness and tingling 11/12/2022   Post-operative state 11/21/2020   H/O vaginal hysterectomy 11/21/2020   Vaginal discharge 10/05/2020   Menopausal symptoms 09/01/2020   Dermatophytosis 05/25/2020   Obesity 04/28/2018   Anxiety and depression 10/04/2016     Current Outpatient Medications on File Prior to Visit  Medication Sig Dispense Refill   valsartan (DIOVAN) 40 MG tablet Take 1 tablet (40 mg total) by mouth daily. 30 tablet 1   hydrOXYzine (ATARAX/VISTARIL) 25 MG tablet Take 1 tablet (25 mg total) by mouth every 6 (six) hours as needed for anxiety. (Patient not taking: Reported on 02/08/2023) 30 tablet 0   ibuprofen (ADVIL) 800 MG tablet Take 1 tablet (800 mg total) by mouth 3 (three) times daily. (Patient not taking: Reported on 02/08/2023) 30 tablet 0   meloxicam (MOBIC) 15 MG tablet Take 1 tablet (15 mg total) by mouth daily. (Patient not taking: Reported on 02/08/2023) 30 tablet 2   methocarbamol (ROBAXIN) 500 MG tablet Take 2 tablets (1,000 mg total) by mouth 4 (four) times daily. Prn muscle spasm (Patient not taking: Reported on 02/08/2023) 90 tablet 1   metroNIDAZOLE (METROGEL) 0.75 % vaginal gel Place 1 Applicatorful vaginally at bedtime. Apply one applicatorful to vagina at bedtime for 5 days (Patient not taking: Reported on 02/08/2023) 70 g 1   omeprazole (PRILOSEC) 20 MG capsule Take 20 mg by mouth as needed. (Patient not taking: Reported on 01/28/2023)     Current Facility-Administered Medications on File Prior to Visit  Medication Dose Route Frequency Provider Last Rate Last Admin   triamcinolone acetonide (KENALOG-40) injection 40 mg   40 mg Intramuscular Once Rema Fendt, NP        Allergies  Allergen Reactions   Aspirin Other (See Comments)    HEADACHE, DIZZINESS   Atenolol Other (See Comments)    HEADACHES DIZZINESS    Social History   Socioeconomic History   Marital status: Single    Spouse name: Not on file   Number of children: Not on file   Years of education: Not on file   Highest education level: Not on file  Occupational History   Not on file  Tobacco Use   Smoking status: Never    Passive exposure: Never   Smokeless tobacco: Never  Vaping Use   Vaping status: Never Used  Substance and Sexual Activity   Alcohol use: No   Drug use: No   Sexual activity: Yes    Birth control/protection: Surgical  Other Topics Concern   Not on file  Social History Narrative   Not on file   Social Drivers of Health   Financial Resource Strain: Medium Risk (02/08/2023)   Overall Financial Resource Strain (CARDIA)    Difficulty of Paying Living Expenses: Somewhat hard  Food Insecurity: No Food Insecurity (04/28/2018)   Hunger Vital Sign    Worried About Running Out of Food in the Last Year: Never true    Ran Out of Food in the Last Year: Never true  Transportation Needs: No Transportation Needs (04/28/2018)   PRAPARE - Administrator, Civil Service (Medical): No  Lack of Transportation (Non-Medical): No  Physical Activity: Inactive (02/08/2023)   Exercise Vital Sign    Days of Exercise per Week: 0 days    Minutes of Exercise per Session: 0 min  Stress: No Stress Concern Present (02/08/2023)   [employer] of Occupational Health - Occupational Stress Questionnaire    Feeling of Stress : Only a little  Social Connections: Moderately Isolated (02/08/2023)   Social Connection and Isolation Panel [NHANES]    Frequency of Communication with Friends and Family: Three times a week    Frequency of Social Gatherings with Friends and Family: Three times a week    Attends Religious Services: More  than 4 times per year    Active Member of Clubs or Organizations: No    Attends Banker Meetings: Never    Marital Status: Divorced  Catering manager Violence: Not At Risk (02/08/2023)   Humiliation, Afraid, Rape, and Kick questionnaire    Fear of Current or Ex-Partner: No    Emotionally Abused: No    Physically Abused: No    Sexually Abused: No    Family History  Problem Relation Age of Onset   Asthma Mother     Past Surgical History:  Procedure Laterality Date   BREAST LUMPECTOMY Right 1991   BENIGN   CHOLECYSTECTOMY  2006   LAPAROSCOPIC   DILATATION AND CURETTAGE/HYSTEROSCOPY WITH MINERVA N/A 07/30/2018   Procedure: DILATATION AND CURETTAGE/HYSTEROSCOPY WITH MINERVA;  Surgeon: Allie Bossier, MD;  Location: [facility];  Service: Gynecology;  Laterality: N/A;   TUBAL LIGATION     21 YRS AGO PER PT ON 10-04-2020   VAGINAL HYSTERECTOMY  10/11/2020   Procedure: TOTAL HYSTERECTOMY VAGINAL with RIGHT SALPINGECTOMY;  Surgeon: Hermina Staggers, MD;  Location:  SURGERY CENTER;  Service: Gynecology;;    ROS: Review of Systems Negative except as stated above  PHYSICAL EXAM: BP 132/85   Pulse 78   Temp 97.9 F (36.6 C) (Oral)   Ht 5\' 7"  (1.702 m)   Wt 279 lb 12.8 oz (126.9 kg)   LMP 09/22/2020   SpO2 97%   BMI 43.82 kg/m   Physical Exam  {female adult master:310786} {female adult master:310785}     Latest Ref Rng & Units 01/11/2023    2:17 PM 10/18/2021    3:45 PM 10/11/2020   11:14 PM  CMP  Glucose 70 - 99 mg/dL 52  79  562   BUN 6 - 24 mg/dL 11  13  8    Creatinine 0.57 - 1.00 mg/dL 1.30  8.65  7.84   Sodium 134 - 144 mmol/L 141  142  134   Potassium 3.5 - 5.2 mmol/L 4.1  4.1  4.0   Chloride 96 - 106 mmol/L 104  106  106   CO2 20 - 29 mmol/L 21  20  21    Calcium 8.7 - 10.2 mg/dL 9.7  9.4  8.7    Lipid Panel     Component Value Date/Time   CHOL 202 (H) 10/18/2021 1545   TRIG 155 (H) 10/18/2021 1545   HDL 52 10/18/2021 1545    CHOLHDL 3.9 10/18/2021 1545   CHOLHDL 4.2 Ratio 10/13/2008 2046   VLDL 25 10/13/2008 2046   LDLCALC 123 (H) 10/18/2021 1545    CBC    Component Value Date/Time   WBC 11.6 (H) 10/11/2020 2314   RBC 4.03 10/11/2020 2314   HGB 11.9 (L) 10/11/2020 2314   HGB 13.9 08/07/2020 0917   HCT  35.3 (L) 10/11/2020 2314   HCT 41.5 08/07/2020 0917   PLT 198 10/11/2020 2314   PLT 209 08/07/2020 0917   MCV 87.6 10/11/2020 2314   MCV 88 08/07/2020 0917   MCH 29.5 10/11/2020 2314   MCHC 33.7 10/11/2020 2314   RDW 13.2 10/11/2020 2314   RDW 13.1 08/07/2020 0917   LYMPHSABS 1.2 08/22/2020 0753   LYMPHSABS 2.3 05/10/2019 1628   MONOABS 0.5 08/22/2020 0753   EOSABS 0.0 08/22/2020 0753   EOSABS 0.0 05/10/2019 1628   BASOSABS 0.0 08/22/2020 0753   BASOSABS 0.0 05/10/2019 1628    ASSESSMENT AND PLAN:  There are no diagnoses linked to this encounter.   Patient was given the opportunity to ask questions.  Patient verbalized understanding of the plan and was able to repeat key elements of the plan. Patient was given clear instructions to go to Emergency Department or return to medical center if symptoms don't improve, worsen, or new problems develop.The patient verbalized understanding.   No orders of the defined types were placed in this encounter.    Requested Prescriptions    No prescriptions requested or ordered in this encounter    No follow-ups on file.  Rema Fendt, NP

## 2023-02-12 ENCOUNTER — Ambulatory Visit: Payer: Commercial Managed Care - PPO | Admitting: Physical Therapy

## 2023-02-12 ENCOUNTER — Encounter: Payer: Self-pay | Admitting: Physical Therapy

## 2023-02-12 DIAGNOSIS — M546 Pain in thoracic spine: Secondary | ICD-10-CM

## 2023-02-12 DIAGNOSIS — M6281 Muscle weakness (generalized): Secondary | ICD-10-CM

## 2023-02-12 NOTE — Therapy (Addendum)
 OUTPATIENT PHYSICAL THERAPY TREATMENT NOTE/DISCHARGE  PHYSICAL THERAPY DISCHARGE SUMMARY  Visits from Start of Care: 6  Current functional level related to goals / functional outcomes: See goals/objective   Remaining deficits: Unable to assess   Education / Equipment: HEP   Patient agrees to discharge. Patient goals were unable to assess. Patient is being discharged due to not returning since the last visit.     Patient Name: Kristine Kelly MRN: [id number] DOB:[birth year], 53 y.o., female Today's Date: 02/12/2023  END OF SESSION:  PT End of Session - 02/12/23 1314     Visit Number 6    Number of Visits 13    Date for PT Re-Evaluation 02/21/23    Authorization Type [employer]/ [employer]    Authorization Time Period 01/24/23-03/24/23    Authorization - Visit Number 4    Authorization - Number of Visits 7    PT Start Time 1313    PT Stop Time 1355    PT Time Calculation (min) 42 min               Past Medical History:  Diagnosis Date   Anxiety    NO RECENT ANXIETY ATTACKS PER PT ON 10-04-2020   Arthritis    OA LEFT KNEE   Claustrophobia 10/04/2020   COVID-19 2020   COUGH SOB X 6 WEEKS ALL SYMPTOMS RESOLVED   GERD (gastroesophageal reflux disease)    Headache    Hypertension    not currently on medications   Wears glasses 10/04/2020   Past Surgical History:  Procedure Laterality Date   BREAST LUMPECTOMY Right 1991   BENIGN   CHOLECYSTECTOMY  2006   LAPAROSCOPIC   DILATATION AND CURETTAGE/HYSTEROSCOPY WITH MINERVA N/A 07/30/2018   Procedure: DILATATION AND CURETTAGE/HYSTEROSCOPY WITH MINERVA;  Surgeon: Allie Bossier, MD;  Location: [facility];  Service: Gynecology;  Laterality: N/A;   TUBAL LIGATION     21 YRS AGO PER PT ON 10-04-2020   VAGINAL HYSTERECTOMY  10/11/2020   Procedure: TOTAL HYSTERECTOMY VAGINAL with RIGHT SALPINGECTOMY;  Surgeon: Hermina Staggers, MD;  Location: [facility];  Service: Gynecology;;   Patient Active  Problem List   Diagnosis Date Noted   Osteoarthritis, multiple sites 11/12/2022   Pain in right hip 11/12/2022   Numbness and tingling 11/12/2022   Post-operative state 11/21/2020   H/O vaginal hysterectomy 11/21/2020   Vaginal discharge 10/05/2020   Menopausal symptoms 09/01/2020   Dermatophytosis 05/25/2020   Obesity 04/28/2018   Anxiety and depression 10/04/2016    PCP: Rema Fendt, NP  REFERRING PROVIDER: Persons, [place], [place]   REFERRING DIAG: M54.6 (ICD-10-CM) - Pain in thoracic spine   Rationale for Evaluation and Treatment: Rehabilitation  THERAPY DIAG:  Pain in thoracic spine  Muscle weakness (generalized)  ONSET DATE: October 2024  SUBJECTIVE:  SUBJECTIVE STATEMENT: Pain is 4/10 today. I have a hitch in right low back. Side thoracic pain only hurts if I move too quick,      02/01/23: I fell on the ice Monday (4 days ago) , slipped on the stairs and landed on my but, all bruised up. Have a thumb sprain. Still having right sided thoracic to lumbar pain.   EVAL: Pt presents to PT with roughly 2 month hx of R sided thoracic back pain after lifting a box of soda while at work. She also promotes bilateral LE pain and N/T, denies bowel/bladder changes or saddle anesthesia. Lying on her R side greatly increases pain, also notes that she can't reach with her R UE without increasing pain, she is R hand dominant.   PERTINENT HISTORY:  HTN, see other PMH  PAIN:  Are you having pain?  Yes: NPRS scale: 7/10 Worst: 10/10 Pain location: R sided thoracic Pain description: sharp, N/T Aggravating factors: lying on R side Relieving factors: none  PRECAUTIONS: None  RED FLAGS: None   WEIGHT BEARING RESTRICTIONS: No  FALLS:  Has patient fallen in last 6 months? No  LIVING  ENVIRONMENT: Lives with: lives alone Lives in: House/apartment  OCCUPATION: International aid/development worker [employer]   PLOF: Independent  PATIENT GOALS: decrease her back pain, improve her strength  NEXT MD VISIT: As needed  OBJECTIVE:  Note: Objective measures were completed at Evaluation unless otherwise noted.  DIAGNOSTIC FINDINGS:  Formatting of this note might be different from the original. CLINICAL DATA: Back pain, T12 compression fracture.  EXAM: MRI THORACIC SPINE WITHOUT CONTRAST  TECHNIQUE: Multiplanar, multisequence MR imaging of the thoracic spine was performed. No intravenous contrast was administered.  COMPARISON: None Available.  FINDINGS: Alignment: Physiologic.  Vertebrae: No acute fracture, evidence of discitis, or aggressive bone lesion. Small hemangioma in the T5 vertebral body.  Cord: Normal signal and morphology.  Paraspinal and other soft tissues: No acute paraspinal abnormality.  Disc levels:  Disc spaces: Disc spaces are maintained.  Degenerative disease with mild disc height loss at T5-6, T6-7, T7-8 and T10-11.  T1-T2: No disc protrusion, foraminal stenosis or central canal stenosis.  T2-T3: No disc protrusion, foraminal stenosis or central canal stenosis.  T3-T4: No disc protrusion, foraminal stenosis or central canal stenosis.  T4-T5: No disc protrusion, foraminal stenosis or central canal stenosis.  T5-T6: No disc protrusion, foraminal stenosis or central canal stenosis.  T6-T7: Moderate right paracentral disc protrusion with inferior migration of disc material contacting the ventral cervical spinal cord. No foraminal or central canal stenosis.  T7-T8: No disc protrusion, foraminal stenosis or central canal stenosis.  T8-T9: Small central disc protrusion. No foraminal or central canal stenosis.  T9-T10: No disc protrusion, foraminal stenosis or central canal stenosis.  T10-T11: No disc protrusion, foraminal stenosis or  central canal stenosis.  T11-T12: No disc protrusion, foraminal stenosis or central canal stenosis.  IMPRESSION: 1. No acute osseous injury of the thoracic spine. 2. At T6-7 there is a moderate right paracentral disc protrusion with inferior migration of disc material contacting the ventral cervical spinal cord. 3. At T8-9 there is a small central disc protrusion. 4. No foraminal or central canal stenosis of the thoracic spine.   PATIENT SURVEYS:  FOTO: 44% function; 62% predicted 02/08/23: 46%   COGNITION: Overall cognitive status: Within functional limits for tasks assessed     SENSATION: WFL  POSTURE: rounded shoulders and forward head  PALPATION: TTP to R rhomboids, R latissimus   THORACIC/LUMBAR ROM:  AROM eval   Flexion WFL   Extension 50% reudced   Right lateral flexion    Left lateral flexion    Right rotation 25% reduced with pain   Left rotation 25% reduced with pain    (Blank rows = not tested)  UPPER EXTREMITY MMT:  MMT Right eval Left eval Right 02/08/23  Shoulder flexion 3+/5 5/5   Shoulder abduction     Shoulder IR   4+  Shoulder ER 3/5 4+/5 4- pain  Shoulder extension     Upper trap     Middle trap     Lower trap     Elbow flexion      Elbow extension     Wrist flexion     Wrist extension     Grip      (Blank rows = not tested)  GAIT: Distance walked: 4ft Assistive device utilized: None Level of assistance: Complete Independence Comments: no overt deviations noted  [facility] Adult PT Treatment:                                                DATE: 02/12/23 Therapeutic Exercise: Nustep L5 UE/LE x 5 minutes  Seated Lumbar flexion with lateral  Shoulder ER Bilat 8  x 2 RTB  Seated shoulder horiz abdct 8 x 2  Seated diagonals x 5  Seated alternating lat pull down red band 5 x 2 each  Supine pullover 2# dowel x 10 Supine marching -added UE for dead bug 5 x 2 - level 1    Therapeutic Activity: Hip Hinge for squat and lift to 6 inch  step    [facility] Adult PT Treatment:                                                DATE: 02/08/23 Therapeutic Exercise: Nustep L4 UE/LE x 5 minutes  Seated lumbar flexion and rotation using exercise ball PPT 5 sec x 10  Supine march ab brace  Supine clam with blue band   Therapeutic Activity: Golfers pick up -less confident on right  Hip hinge with wide base -  cues mechanics     [facility] Adult PT Treatment:                                                DATE: 02/01/23 Therapeutic Exercise: Seated lumbar flexion  Seated trunk rotations Seated hamstring stretch - foot on stool Supine march Supine Clam AROM Supine dow flexion 2x10  Supine dow chest press 2x10 Wide LTR, narrow LTR Supine horizontal abd 2x10 RTB Supine Bilat ER 2 x 10 RTB     [facility] Adult PT Treatment:                                                DATE: 01/24/23 Therapeutic Exercise: Nustep L5 UE/LE x 4 minutes UE/LE LTR x 5  Supine PPT x 10 - 5" hold Supine PPT with ball x 10 -  5 sec Supine horizontal abd 2x10 GTB Supine clamshell 2x15 GTB Supine dow flexion 2x10  Supine dow chest press 2x10 Seated bilateral ER 2x10 RTB Standing row 2x10 GTB Seated thoracic extension over towel x 10  [facility] Adult PT Treatment:                                                DATE: 01/18/23 Therapeutic Exercise: Nustep L2 UE/LE x 5 minutes UE/LE  Seated scap 5 sec  x 8  Seated Nerve tensioner  Seated upper trap stretch 20 sec 2  x each -  Seated bilateral ER 6 x 2 YTB LTR small ROM x 4 each way  Supine bilateral shoulder flexion 6 x 2 Supine cross body stretch   Supine sciatic nerve tensioner  Self Care: Posture  Pacing with therex  Breathing/relaxation  [facility] Adult PT Treatment:                                                DATE: 01/10/2023 Therapeutic Exercise: Seated sciatic nerve glide x 10  Upper trap stretch x 60" L Seated scapular retractions x 10 Cross arm stretch x 30" Seated biateral ER x 10 YTB                               PATIENT EDUCATION:  Education details: tennis ball release to [employer] Person educated: Patient Education method: Explanation, Demonstration, and Handouts Education comprehension: verbalized understanding and returned demonstration  HOME EXERCISE PROGRAM: Access Code: [email or web address] URL: [email or web address] Date: 01/10/2023 Prepared by: Edwinna Areola  Exercises - Seated Sciatic Tensioner  - 1 x daily - 7 x weekly - 2 sets - 15 reps - Seated Upper Trapezius Stretch  - 1 x daily - 7 x weekly - 2 reps - 30 sec hold - Seated Scapular Retraction  - 1 x daily - 7 x weekly - 3 sets - 10 reps - Standing Shoulder Posterior Capsule Stretch  - 1 x daily - 7 x weekly - 2 reps - 30 sec hold - Shoulder External Rotation and Scapular Retraction with Resistance  - 1 x daily - 7 x weekly - 2 sets - 10 reps - yellow band hold 01/18/23 - Supine Lower Trunk Rotation  - 1 x daily - 7 x weekly - 2 sets - 10 reps - Supine shoulder Flexion Extension AAROM with Dowel to comfortable height overhead  - 1 x daily - 7 x weekly - 2 sets - 10 reps  ASSESSMENT:  CLINICAL IMPRESSION: Pt reports continued general improvement. Sees Orthopedic tomorrow for follow up. She is still c/o right side pain and low back pain. Her right side pain occurs with quick movements. Able to progress scapular bands from supine to seated. Able to progress supine core strengthening to a level 1 dead bug. Reviewed lifting and squatting via hip hinge with pt improving her mechanics cues and notes decreased back and knee pain when correct technique is used.  Will continue to progress per POC.   EVAL: Patient is a 53 y.o. F who was seen today for physical therapy evaluation and treatment for subacute R sided thoracic pain. Physical findings are consistent  with referring provider impression, as pt demonstrates decrease in thoracic ROM and TTP with R periscapular musculature. Unable to rule out possibility of  thoracolumbar muscle strain. FOTO score demonstrates decrease in subjective functional ability below PLOF. Pt would benefit from skilled PT services, will progress as able per POC.     OBJECTIVE IMPAIRMENTS: decreased activity tolerance, decreased mobility, difficulty walking, decreased ROM, decreased strength, impaired UE functional use, postural dysfunction, and pain   ACTIVITY LIMITATIONS: carrying, lifting, bending, dressing, self feeding, and reach over head  PARTICIPATION LIMITATIONS: meal prep, cleaning, driving, shopping, community activity, occupation, and yard work  PERSONAL FACTORS: 1 comorbidity: HTN  are also affecting patient's functional outcome.   REHAB POTENTIAL: Excellent  CLINICAL DECISION MAKING: Stable/uncomplicated  EVALUATION COMPLEXITY: Low   GOALS: Goals reviewed with patient? No  SHORT TERM GOALS: Target date: 01/31/2023   Pt will be compliant and knowledgeable with initial HEP for improved comfort and carryover Baseline: initial HEP given  02/08/23: non compliant  Goal status: ONGOING  2.  Pt will self report right sided thoracic pain pain no greater than 6/10 for improved comfort and functional ability Baseline: 10/10 at worst 02/08/23: 4-5/10 Goal status: MET   LONG TERM GOALS: Target date: 02/21/2023   Pt will improve FOTO function score to no less than 62% as proxy for functional improvement Baseline: 44% function 02/08/23: 46%  Goal status: ONGOING    2.  Pt will self report right sided thoracic pain pain no greater than 3/10 for improved comfort and functional ability Baseline: 10/10 at worst 02/08/23: 4-5/10 Goal status: ONGOING   3.  Pt will increase R ER MMT to no less than 4/5 for improved comfort and functional ability with work and other ADLs Baseline: 3/5  02/08/23: 4-/5 Goal status: INITIAL   4.  Pt will be able to reach overhead not limited by pain in mid back for improved comfort with work related duites  Baseline: unable Goal  status: INITIAL   PLAN:  PT FREQUENCY: 1-2x/week  PT DURATION: 6 weeks  PLANNED INTERVENTIONS: 97164- PT Re-evaluation, 97110-Therapeutic exercises, 97530- Therapeutic activity, [id number]- Neuromuscular re-education, 97535- Self Care, 84132- Manual therapy, [id number]- Aquatic Therapy, 97014- Electrical stimulation (unattended), [id number]- Electrical stimulation (manual), 97016- Vasopneumatic device, Dry Needling, Cryotherapy, and Moist heat  PLAN FOR NEXT SESSION: assess HEP response, periscapular strengthening, STM to periscapular musculature   For all possible CPT codes, reference the Planned Interventions line above.     Check all conditions that are expected to impact treatment: {Conditions expected to impact treatment:Musculoskeletal disorders   If treatment provided at initial evaluation, no treatment charged due to lack of authorization.       Jannette Spanner, PTA 02/12/23 2:00 PM Phone: [phone] Fax: [phone]

## 2023-02-13 ENCOUNTER — Encounter: Payer: Self-pay | Admitting: Physician Assistant

## 2023-02-13 ENCOUNTER — Ambulatory Visit (INDEPENDENT_AMBULATORY_CARE_PROVIDER_SITE_OTHER): Payer: Commercial Managed Care - PPO | Admitting: Physician Assistant

## 2023-02-13 DIAGNOSIS — M79644 Pain in right finger(s): Secondary | ICD-10-CM

## 2023-02-13 NOTE — Progress Notes (Signed)
Office Visit Note   Patient: Kristine Kelly           Date of Birth: 1971-01-13           MRN: [id number] Visit Date: 02/13/2023              Requested by: Rema Fendt, NP [address] [place],  [place] [address] PCP: Rema Fendt, NP  Chief Complaint  Patient presents with   Left Hand - Follow-up   Lower Back - Follow-up      HPI: Patient is here in follow-up for her thumb injury.  She slipped on the ice and fell onto her right hand.  X-rays were negative she however she did have significant pain on her thumb.  She was given a thumb spica splint.  No longer using the splint  Assessment & Plan: Visit Diagnoses: Right thumb pain  Plan: Should finish her therapy for her back should continue to get better with her thumb if she has any concerns I would order an MRI  Follow-Up Instructions: Return if symptoms worsen or fail to improve.   Ortho Exam  Patient is alert, oriented, no adenopathy, well-dressed, normal affect, normal respiratory effort. Patient of her thumb good strength no swelling brisk capillary refill no tenderness with manipulation  Imaging: No results found. No images are attached to the encounter.  Labs: Lab Results  Component Value Date   HGBA1C 5.6 10/18/2021   HGBA1C 5.7 (H) 05/30/2020   HGBA1C CANCELED 05/25/2020   ESRSEDRATE 33 (H) 11/13/2022     Lab Results  Component Value Date   ALBUMIN 4.3 08/22/2020   ALBUMIN 4.0 03/26/2020   ALBUMIN 4.1 12/18/2019    No results found for: "MG" No results found for: "VD25OH"  No results found for: "PREALBUMIN"    Latest Ref Rng & Units 10/11/2020   11:14 PM 10/07/2020    3:02 PM 08/22/2020    7:53 AM  CBC EXTENDED  WBC 4.0 - 10.5 K/uL 11.6  7.6  6.7   RBC 3.87 - 5.11 MIL/uL 4.03  4.46  4.85   Hemoglobin 12.0 - 15.0 g/dL 44.0  10.2  72.5   HCT 36.0 - 46.0 % 35.3  40.0  42.6   Platelets 150 - 400 K/uL 198  218  231   NEUT# 1.7 - 7.7 K/uL   5.0   Lymph# 0.7 - 4.0 K/uL   1.2       There is no height or weight on file to calculate BMI.  Orders:  No orders of the defined types were placed in this encounter.  No orders of the defined types were placed in this encounter.    Procedures: No procedures performed  Clinical Data: No additional findings.  ROS:  All other systems negative, except as noted in the HPI. Review of Systems  Objective: Vital Signs: LMP 09/22/2020   Specialty Comments:  Janeth Rase, MD - 12/24/2022  Formatting of this note might be different from the original.  CLINICAL DATA:  Back pain, T12 compression fracture.   EXAM:  MRI THORACIC SPINE WITHOUT CONTRAST   TECHNIQUE:  Multiplanar, multisequence MR imaging of the thoracic spine was  performed. No intravenous contrast was administered.   COMPARISON:  None Available.   FINDINGS:  Alignment: Physiologic.   Vertebrae: No acute fracture, evidence of discitis, or aggressive  bone lesion. Small hemangioma in the T5 vertebral body.   Cord:  Normal signal and morphology.   Paraspinal  and other soft tissues: No acute paraspinal abnormality.   Disc levels:   Disc spaces:  Disc spaces are maintained.   Degenerative disease with mild disc height loss at T5-6, T6-7, T7-8  and T10-11.   T1-T2: No disc protrusion, foraminal stenosis or central canal  stenosis.   T2-T3: No disc protrusion, foraminal stenosis or central canal  stenosis.   T3-T4: No disc protrusion, foraminal stenosis or central canal  stenosis.   T4-T5: No disc protrusion, foraminal stenosis or central canal  stenosis.   T5-T6: No disc protrusion, foraminal stenosis or central canal  stenosis.   T6-T7: Moderate right paracentral disc protrusion with inferior  migration of disc material contacting the ventral cervical spinal  cord. No foraminal or central canal stenosis.   T7-T8: No disc protrusion, foraminal stenosis or central canal  stenosis.   T8-T9: Small central disc protrusion. No  foraminal or central canal  stenosis.   T9-T10: No disc protrusion, foraminal stenosis or central canal  stenosis.   T10-T11: No disc protrusion, foraminal stenosis or central canal  stenosis.   T11-T12: No disc protrusion, foraminal stenosis or central canal  stenosis.   IMPRESSION:  1. No acute osseous injury of the thoracic spine.  2. At T6-7 there is a moderate right paracentral disc protrusion  with inferior migration of disc material contacting the ventral  cervical spinal cord.  3. At T8-9 there is a small central disc protrusion.  4. No foraminal or central canal stenosis of the thoracic spine.    Electronically Signed    By: Elige Ko M.D.    On: 12/24/2022 14:04  PMFS History: Patient Active Problem List   Diagnosis Date Noted   Osteoarthritis, multiple sites 11/12/2022   Pain in right hip 11/12/2022   Numbness and tingling 11/12/2022   Post-operative state 11/21/2020   H/O vaginal hysterectomy 11/21/2020   Vaginal discharge 10/05/2020   Menopausal symptoms 09/01/2020   Dermatophytosis 05/25/2020   Obesity 04/28/2018   Anxiety and depression 10/04/2016   Past Medical History:  Diagnosis Date   Anxiety    NO RECENT ANXIETY ATTACKS PER PT ON 10-04-2020   Arthritis    OA LEFT KNEE   Claustrophobia 10/04/2020   COVID-19 2020   COUGH SOB X 6 WEEKS ALL SYMPTOMS RESOLVED   GERD (gastroesophageal reflux disease)    Headache    Hypertension    not currently on medications   Wears glasses 10/04/2020    Family History  Problem Relation Age of Onset   Asthma Mother     Past Surgical History:  Procedure Laterality Date   BREAST LUMPECTOMY Right 1991   BENIGN   CHOLECYSTECTOMY  2006   LAPAROSCOPIC   DILATATION AND CURETTAGE/HYSTEROSCOPY WITH MINERVA N/A 07/30/2018   Procedure: DILATATION AND CURETTAGE/HYSTEROSCOPY WITH MINERVA;  Surgeon: Allie Bossier, MD;  Location: [facility];  Service: Gynecology;  Laterality: N/A;   TUBAL LIGATION      21 YRS AGO PER PT ON 10-04-2020   VAGINAL HYSTERECTOMY  10/11/2020   Procedure: TOTAL HYSTERECTOMY VAGINAL with RIGHT SALPINGECTOMY;  Surgeon: Hermina Staggers, MD;  Location: [facility];  Service: Gynecology;;   Social History   Occupational History   Not on file  Tobacco Use   Smoking status: Never    Passive exposure: Never   Smokeless tobacco: Never  Vaping Use   Vaping status: Never Used  Substance and Sexual Activity   Alcohol use: No   Drug use: No  Sexual activity: Yes    Birth control/protection: Surgical

## 2023-02-14 ENCOUNTER — Ambulatory Visit: Payer: Commercial Managed Care - PPO

## 2023-02-19 ENCOUNTER — Ambulatory Visit: Payer: Commercial Managed Care - PPO | Admitting: Family

## 2023-03-18 ENCOUNTER — Telehealth: Payer: Self-pay | Admitting: Physician Assistant

## 2023-03-18 NOTE — Telephone Encounter (Signed)
 Copied from CRM [id number]. Topic: Referral - Request for Referral >> Mar 18, 2023  1:09 PM Eunice Blase wrote: Did the patient discuss referral with their provider in the last year? Yes (If No - schedule appointment) (If Yes - send message)  Appointment offered? Yes  Type of order/referral and detailed reason for visit: Both Beatriz Chancellor Ph# [phone], [address] [place], [place] [address]   Preference of office, provider, location: Cleda Clarks Ph# [phone] [address] [place], [place] [address]   If referral order, have you been seen by this specialty before? Yes (If Yes, this issue or another issue? When? Where?  Can we respond through MyChart? Yes

## 2023-03-18 NOTE — Telephone Encounter (Signed)
 Copied from CRM [id number]. Topic: General - Billing Inquiry >> Mar 18, 2023  1:03 PM Eunice Blase wrote: Reason for CRM: Pt called regarding bill that was sent to [facility] instead of [employer]. Please call pt at [phone].

## 2023-03-26 NOTE — Telephone Encounter (Signed)
 Called patient and explain to her the [facility] coverage is still showing as active. If she no longer wanted the [facility] insurance then she will have to contact them and terminate her coverage. She was upset about claims going to [facility] as primary and [employer] as secondary and I explained to her that [employer] will always be secondary coverage if you have another commercial plan active. Kristine Kelly stating that she will contact [employer].

## 2023-04-07 ENCOUNTER — Telehealth: Admitting: Physician Assistant

## 2023-04-07 DIAGNOSIS — N760 Acute vaginitis: Secondary | ICD-10-CM

## 2023-04-07 MED ORDER — FLUCONAZOLE 150 MG PO TABS: 150.0000 mg | ORAL_TABLET | Freq: Once | ORAL | 0 refills | Status: AC

## 2023-04-07 NOTE — Patient Instructions (Signed)
 Mikyla H Luce, thank you for joining [name], PA-C for today's virtual visit.  While this provider is not your primary care provider (PCP), if your PCP is located in our provider database this encounter information will be shared with them immediately following your visit.   A [facility] MyChart account gives you access to today's visit and all your visits, tests, and labs performed at [facility] " click here if you don't have a [facility] MyChart account or go to [email or web address]  Consent: (Patient) Kristine Kelly provided verbal consent for this virtual visit at the beginning of the encounter.  Current Medications:  Current Outpatient Medications:    fluconazole (DIFLUCAN) 150 MG tablet, Take 1 tablet (150 mg total) by mouth once for 1 dose., Disp: 1 tablet, Rfl: 0   hydrOXYzine (ATARAX/VISTARIL) 25 MG tablet, Take 1 tablet (25 mg total) by mouth every 6 (six) hours as needed for anxiety. (Patient not taking: Reported on 02/08/2023), Disp: 30 tablet, Rfl: 0   ibuprofen (ADVIL) 800 MG tablet, Take 1 tablet (800 mg total) by mouth 3 (three) times daily. (Patient not taking: Reported on 02/08/2023), Disp: 30 tablet, Rfl: 0   meloxicam (MOBIC) 15 MG tablet, Take 1 tablet (15 mg total) by mouth daily. (Patient not taking: Reported on 02/08/2023), Disp: 30 tablet, Rfl: 2   methocarbamol (ROBAXIN) 500 MG tablet, Take 2 tablets (1,000 mg total) by mouth 4 (four) times daily. Prn muscle spasm (Patient not taking: Reported on 02/08/2023), Disp: 90 tablet, Rfl: 1   metroNIDAZOLE (METROGEL) 0.75 % vaginal gel, Place 1 Applicatorful vaginally at bedtime. Apply one applicatorful to vagina at bedtime for 5 days (Patient not taking: Reported on 02/08/2023), Disp: 70 g, Rfl: 1   omeprazole (PRILOSEC) 20 MG capsule, Take 20 mg by mouth as needed. (Patient not taking: Reported on 01/28/2023), Disp: , Rfl:    valsartan (DIOVAN) 40 MG tablet, Take 1 tablet (40 mg total) by mouth daily., Disp:  30 tablet, Rfl: 1  Current Facility-Administered Medications:    triamcinolone acetonide (KENALOG-40) injection 40 mg, 40 mg, Intramuscular, Once, Zonia Kief, Amy J, NP   Medications ordered in this encounter:  Meds ordered this encounter  Medications   fluconazole (DIFLUCAN) 150 MG tablet    Sig: Take 1 tablet (150 mg total) by mouth once for 1 dose.    Dispense:  1 tablet    Refill:  0    Supervising Provider:   Merrilee Jansky [[id number]]     *If you need refills on other medications prior to your next appointment, please contact your pharmacy*  Follow-Up: Call back or seek an in-person evaluation if the symptoms worsen or if the condition fails to improve as anticipated.  [facility] [phone]  Other Instructions Please report to the nearest Emergency room with any worsening symptoms. Follow up with primary care provider (PCP) in 2 -3 days.    If you have been instructed to have an in-person evaluation today at a local Urgent Care facility, please use the link below. It will take you to a list of all of our available [facility], including address, phone number and hours of operation. Please do not delay care.  [facility]  If you or a family member do not have a primary care provider, use the link below to schedule a visit and establish care. When you choose a [facility] primary care physician or advanced practice provider, you gain a long-term partner  in health. Find a Primary Care Provider  Learn more about [facility]'s in-office and virtual care options: [facility] - Get Care Now

## 2023-04-07 NOTE — Progress Notes (Signed)
 Virtual Visit Consent   Kristine Kelly, you are scheduled for a virtual visit with a [facility] provider today. Just as with appointments in the office, your consent must be obtained to participate. Your consent will be active for this visit and any virtual visit you may have with one of our providers in the next 365 days. If you have a MyChart account, a copy of this consent can be sent to you electronically.  As this is a virtual visit, video technology does not allow for your provider to perform a traditional examination. This may limit your provider's ability to fully assess your condition. If your provider identifies any concerns that need to be evaluated in person or the need to arrange testing (such as labs, EKG, etc.), we will make arrangements to do so. Although advances in technology are sophisticated, we cannot ensure that it will always work on either your end or our end. If the connection with a video visit is poor, the visit may have to be switched to a telephone visit. With either a video or telephone visit, we are not always able to ensure that we have a secure connection.  By engaging in this virtual visit, you consent to the provision of healthcare and authorize for your insurance to be billed (if applicable) for the services provided during this visit. Depending on your insurance coverage, you may receive a charge related to this service.  I need to obtain your verbal consent now. Are you willing to proceed with your visit today? Dawnetta H Ritter has provided verbal consent on 04/07/2023 for a virtual visit (video or telephone). Kristine Kelly, [place]  Date: 04/07/2023 5:40 PM   Virtual Visit via Video Note   I, Kristine Kelly, connected with  [name]  ([id number], 1970-08-02) on 04/07/23 at  5:30 PM EDT by a video-enabled telemedicine application and verified that I am speaking with the correct person using two identifiers.  Location: Patient: Virtual Visit Location Patient:  Home Provider: Virtual Visit Location Provider: Home Office   I discussed the limitations of evaluation and management by telemedicine and the availability of in person appointments. The patient expressed understanding and agreed to proceed.    History of Present Illness: Kristine Kelly is a 53 y.o. who identifies as a female who was assigned female at birth, and is being seen today for  yeast infection.  HPI: Vaginal Itching Primary symptoms comment: dryness. This is a recurrent problem. The current episode started in the past 7 days. The problem occurs constantly. The patient is experiencing no pain. She is not pregnant. Pertinent negatives include no abdominal pain, anorexia, back pain, chills, constipation, diarrhea, discolored urine, dysuria, fever, flank pain, frequency, headaches, hematuria, joint pain, joint swelling, nausea, painful intercourse, rash, sore throat, urgency or vomiting. She is not sexually active. No, her partner does not have an STD. Her past medical history is significant for vaginosis.    Problems:  Patient Active Problem List   Diagnosis Date Noted   Osteoarthritis, multiple sites 11/12/2022   Pain in right hip 11/12/2022   Numbness and tingling 11/12/2022   Post-operative state 11/21/2020   H/O vaginal hysterectomy 11/21/2020   Vaginal discharge 10/05/2020   Menopausal symptoms 09/01/2020   Dermatophytosis 05/25/2020   Obesity 04/28/2018   Anxiety and depression 10/04/2016    Allergies:  Allergies  Allergen Reactions   Aspirin Other (See Comments)    HEADACHE, DIZZINESS   Atenolol Other (See Comments)    HEADACHES DIZZINESS  Medications:  Current Outpatient Medications:    fluconazole (DIFLUCAN) 150 MG tablet, Take 1 tablet (150 mg total) by mouth once for 1 dose., Disp: 1 tablet, Rfl: 0   hydrOXYzine (ATARAX/VISTARIL) 25 MG tablet, Take 1 tablet (25 mg total) by mouth every 6 (six) hours as needed for anxiety. (Patient not taking: Reported on  02/08/2023), Disp: 30 tablet, Rfl: 0   ibuprofen (ADVIL) 800 MG tablet, Take 1 tablet (800 mg total) by mouth 3 (three) times daily. (Patient not taking: Reported on 02/08/2023), Disp: 30 tablet, Rfl: 0   meloxicam (MOBIC) 15 MG tablet, Take 1 tablet (15 mg total) by mouth daily. (Patient not taking: Reported on 02/08/2023), Disp: 30 tablet, Rfl: 2   methocarbamol (ROBAXIN) 500 MG tablet, Take 2 tablets (1,000 mg total) by mouth 4 (four) times daily. Prn muscle spasm (Patient not taking: Reported on 02/08/2023), Disp: 90 tablet, Rfl: 1   metroNIDAZOLE (METROGEL) 0.75 % vaginal gel, Place 1 Applicatorful vaginally at bedtime. Apply one applicatorful to vagina at bedtime for 5 days (Patient not taking: Reported on 02/08/2023), Disp: 70 g, Rfl: 1   omeprazole (PRILOSEC) 20 MG capsule, Take 20 mg by mouth as needed. (Patient not taking: Reported on 01/28/2023), Disp: , Rfl:    valsartan (DIOVAN) 40 MG tablet, Take 1 tablet (40 mg total) by mouth daily., Disp: 30 tablet, Rfl: 1  Current Facility-Administered Medications:    triamcinolone acetonide (KENALOG-40) injection 40 mg, 40 mg, Intramuscular, Once, Rema Fendt, NP  Observations/Objective: Patient is well-developed, well-nourished in no acute distress.  Resting comfortably  at home.  Head is normocephalic, atraumatic.  No labored breathing.  Speech is clear and coherent with logical content.  Patient is alert and oriented at baseline.    Assessment and Plan: 1. Acute vaginitis (Primary)   Patient presenting with dysuria most consistent with yeast vaginosis given recent sikilar episode  I also considered PID, pregnancy, ectopic pregnancy, endometriosis, tubovarian abscess, appendicitis, UTI  and pyelonephritis, but this appears less likely considering the data gathered thus far.  I have instructed the patient to present to the ER at any time if there are any new or worsening symptoms.  The patient expressed understanding of and agreement with  this plan.  Opportunity was given for questions prior to discharge and all stated questions were answered to the patient's satisfaction.   Follow Up Instructions: I discussed the assessment and treatment plan with the patient. The patient was provided an opportunity to ask questions and all were answered. The patient agreed with the plan and demonstrated an understanding of the instructions.  A copy of instructions were sent to the patient via MyChart unless otherwise noted below.     The patient was advised to call back or seek an in-person evaluation if the symptoms worsen or if the condition fails to improve as anticipated.    Kristine Kidney, PA-C

## 2023-04-18 ENCOUNTER — Ambulatory Visit (INDEPENDENT_AMBULATORY_CARE_PROVIDER_SITE_OTHER): Admitting: Family

## 2023-04-18 VITALS — BP 122/84 | HR 90 | Temp 98.2°F | Ht 67.0 in | Wt 282.4 lb

## 2023-04-18 DIAGNOSIS — M25562 Pain in left knee: Secondary | ICD-10-CM

## 2023-04-18 DIAGNOSIS — M25561 Pain in right knee: Secondary | ICD-10-CM | POA: Diagnosis not present

## 2023-04-18 DIAGNOSIS — G8929 Other chronic pain: Secondary | ICD-10-CM | POA: Diagnosis not present

## 2023-04-18 DIAGNOSIS — I10 Essential (primary) hypertension: Secondary | ICD-10-CM

## 2023-04-18 DIAGNOSIS — M25511 Pain in right shoulder: Secondary | ICD-10-CM

## 2023-04-18 MED ORDER — TRIAMCINOLONE ACETONIDE 40 MG/ML IJ SUSP: 40.0000 mg | Freq: Once | INTRAMUSCULAR | Status: AC

## 2023-04-18 MED ORDER — VALSARTAN 40 MG PO TABS: 40.0000 mg | ORAL_TABLET | Freq: Every day | ORAL | 1 refills | Status: DC

## 2023-04-18 NOTE — Progress Notes (Signed)
 Patient ID: Kristine Kelly, female    DOB: [birth year]  MRN: [id number]  CC: Chronic Conditions Follow-Up  Subjective: Kristine Kelly is a 53 y.o. female who presents for chronic conditions follow-up.   Her concerns today include:  - Doing well on Valsartan, no issues/concerns. She does not complain of red flag symptoms such as but not limited to chest pain, shortness of breath, worst headache of life, nausea/vomiting.  - Chronic right shoulder pain and chronic bilateral knee pain persisting. Denies recent trauma/injury and red flag symptoms. Established with Orthopedics.  Patient Active Problem List   Diagnosis Date Noted   Osteoarthritis, multiple sites 11/12/2022   Pain in right hip 11/12/2022   Numbness and tingling 11/12/2022   Post-operative state 11/21/2020   H/O vaginal hysterectomy 11/21/2020   Vaginal discharge 10/05/2020   Menopausal symptoms 09/01/2020   Dermatophytosis 05/25/2020   Obesity 04/28/2018   Anxiety and depression 10/04/2016     Current Outpatient Medications on File Prior to Visit  Medication Sig Dispense Refill   hydrOXYzine (ATARAX/VISTARIL) 25 MG tablet Take 1 tablet (25 mg total) by mouth every 6 (six) hours as needed for anxiety. (Patient not taking: Reported on 02/08/2023) 30 tablet 0   ibuprofen (ADVIL) 800 MG tablet Take 1 tablet (800 mg total) by mouth 3 (three) times daily. (Patient not taking: Reported on 02/08/2023) 30 tablet 0   meloxicam (MOBIC) 15 MG tablet Take 1 tablet (15 mg total) by mouth daily. (Patient not taking: Reported on 02/08/2023) 30 tablet 2   methocarbamol (ROBAXIN) 500 MG tablet Take 2 tablets (1,000 mg total) by mouth 4 (four) times daily. Prn muscle spasm (Patient not taking: Reported on 02/08/2023) 90 tablet 1   metroNIDAZOLE (METROGEL) 0.75 % vaginal gel Place 1 Applicatorful vaginally at bedtime. Apply one applicatorful to vagina at bedtime for 5 days (Patient not taking: Reported on 02/08/2023) 70 g 1   omeprazole (PRILOSEC) 20  MG capsule Take 20 mg by mouth as needed. (Patient not taking: Reported on 01/28/2023)     Current Facility-Administered Medications on File Prior to Visit  Medication Dose Route Frequency Provider Last Rate Last Admin   triamcinolone acetonide (KENALOG-40) injection 40 mg  40 mg Intramuscular Once Rema Fendt, NP        Allergies  Allergen Reactions   Aspirin Other (See Comments)    HEADACHE, DIZZINESS   Atenolol Other (See Comments)    HEADACHES DIZZINESS    Social History   Socioeconomic History   Marital status: Single    Spouse name: Not on file   Number of children: Not on file   Years of education: Not on file   Highest education level: Not on file  Occupational History   Not on file  Tobacco Use   Smoking status: Never    Passive exposure: Never   Smokeless tobacco: Never  Vaping Use   Vaping status: Never Used  Substance and Sexual Activity   Alcohol use: No   Drug use: No   Sexual activity: Yes    Birth control/protection: Surgical  Other Topics Concern   Not on file  Social History Narrative   Not on file   Social Drivers of Health   Financial Resource Strain: Medium Risk (02/08/2023)   Overall Financial Resource Strain (CARDIA)    Difficulty of Paying Living Expenses: Somewhat hard  Food Insecurity: No Food Insecurity (04/28/2018)   Hunger Vital Sign    Worried About Running Out of Food in the Last  Year: Never true    Ran Out of Food in the Last Year: Never true  Transportation Needs: No Transportation Needs (04/28/2018)   PRAPARE - Administrator, Civil Service (Medical): No    Lack of Transportation (Non-Medical): No  Physical Activity: Inactive (02/08/2023)   Exercise Vital Sign    Days of Exercise per Week: 0 days    Minutes of Exercise per Session: 0 min  Stress: No Stress Concern Present (02/08/2023)   [employer] of Occupational Health - Occupational Stress Questionnaire    Feeling of Stress : Only a little  Social  Connections: Moderately Isolated (02/08/2023)   Social Connection and Isolation Panel [NHANES]    Frequency of Communication with Friends and Family: Three times a week    Frequency of Social Gatherings with Friends and Family: Three times a week    Attends Religious Services: More than 4 times per year    Active Member of Clubs or Organizations: No    Attends Banker Meetings: Never    Marital Status: Divorced  Catering manager Violence: Not At Risk (02/08/2023)   Humiliation, Afraid, Rape, and Kick questionnaire    Fear of Current or Ex-Partner: No    Emotionally Abused: No    Physically Abused: No    Sexually Abused: No    Family History  Problem Relation Age of Onset   Asthma Mother     Past Surgical History:  Procedure Laterality Date   BREAST LUMPECTOMY Right 1991   BENIGN   CHOLECYSTECTOMY  2006   LAPAROSCOPIC   DILATATION AND CURETTAGE/HYSTEROSCOPY WITH MINERVA N/A 07/30/2018   Procedure: DILATATION AND CURETTAGE/HYSTEROSCOPY WITH MINERVA;  Surgeon: Allie Bossier, MD;  Location: [facility];  Service: Gynecology;  Laterality: N/A;   TUBAL LIGATION     21 YRS AGO PER PT ON 10-04-2020   VAGINAL HYSTERECTOMY  10/11/2020   Procedure: TOTAL HYSTERECTOMY VAGINAL with RIGHT SALPINGECTOMY;  Surgeon: Hermina Staggers, MD;  Location: [facility];  Service: Gynecology;;    ROS: Review of Systems Negative except as stated above  PHYSICAL EXAM: BP 122/84   Pulse 90   Temp 98.2 F (36.8 C) (Oral)   Ht 5\' 7"  (1.702 m)   Wt 282 lb 6.4 oz (128.1 kg)   LMP 09/22/2020   SpO2 96%   BMI 44.23 kg/m   Physical Exam HENT:     Head: Normocephalic and atraumatic.     Nose: Nose normal.     Mouth/Throat:     Mouth: Mucous membranes are moist.     Pharynx: Oropharynx is clear.  Eyes:     Extraocular Movements: Extraocular movements intact.     Conjunctiva/sclera: Conjunctivae normal.     Pupils: Pupils are equal, round, and reactive to  light.  Cardiovascular:     Rate and Rhythm: Normal rate and regular rhythm.     Pulses: Normal pulses.     Heart sounds: Normal heart sounds.  Pulmonary:     Effort: Pulmonary effort is normal.     Breath sounds: Normal breath sounds.  Musculoskeletal:        General: Normal range of motion.     Right shoulder: Normal.     Left shoulder: Normal.     Right upper arm: Normal.     Left upper arm: Normal.     Right elbow: Normal.     Left elbow: Normal.     Right forearm: Normal.  Left forearm: Normal.     Right wrist: Normal.     Left wrist: Normal.     Right hand: Normal.     Left hand: Normal.     Cervical back: Normal, normal range of motion and neck supple.     Thoracic back: Normal.     Lumbar back: Normal.     Right hip: Normal.     Left hip: Normal.     Right upper leg: Normal.     Left upper leg: Normal.     Right knee: Normal.     Left knee: Normal.     Right lower leg: Normal.     Left lower leg: Normal.     Right ankle: Normal.     Left ankle: Normal.     Right foot: Normal.     Left foot: Normal.  Neurological:     General: No focal deficit present.     Mental Status: She is alert and oriented to person, place, and time.  Psychiatric:        Mood and Affect: Mood normal.        Behavior: Behavior normal.      ASSESSMENT AND PLAN: 1. Primary hypertension (Primary) - Continue Valsartan 20 mg (half of 40 mg tablet) as prescribed.  - Counseled on blood pressure goal of less than 130/80, low-sodium, DASH diet, medication compliance, and 150 minutes of moderate intensity exercise per week as tolerated. Counseled on medication adherence and adverse effects. - Follow-up with primary provider in 3 months or sooner if needed. - valsartan (DIOVAN) 40 MG tablet; Take 1 tablet (40 mg total) by mouth daily.  Dispense: 30 tablet; Refill: 1  2. Chronic right shoulder pain 3. Chronic pain of both knees - Triamcinolone Acetonide injection administered.  - Keep all  scheduled appointments with Orthopedics. - triamcinolone acetonide (KENALOG-40) injection 40 mg   Patient was given the opportunity to ask questions.  Patient verbalized understanding of the plan and was able to repeat key elements of the plan. Patient was given clear instructions to go to Emergency Department or return to medical center if symptoms don't improve, worsen, or new problems develop.The patient verbalized understanding.   Requested Prescriptions   Signed Prescriptions Disp Refills   valsartan (DIOVAN) 40 MG tablet 30 tablet 1    Sig: Take 1 tablet (40 mg total) by mouth daily.    Return in about 3 months (around 07/19/2023) for Follow-Up or next available chronic conditions.  Rema Fendt, NP

## 2023-04-18 NOTE — Progress Notes (Signed)
 Patient states shoulder pain as well.  No other concerns to discuss.

## 2023-04-29 ENCOUNTER — Other Ambulatory Visit (INDEPENDENT_AMBULATORY_CARE_PROVIDER_SITE_OTHER): Payer: Self-pay

## 2023-04-29 ENCOUNTER — Ambulatory Visit: Admitting: Physician Assistant

## 2023-04-29 ENCOUNTER — Encounter: Payer: Self-pay | Admitting: Physician Assistant

## 2023-04-29 DIAGNOSIS — M25562 Pain in left knee: Secondary | ICD-10-CM | POA: Insufficient documentation

## 2023-04-29 DIAGNOSIS — M25561 Pain in right knee: Secondary | ICD-10-CM

## 2023-04-29 DIAGNOSIS — G8929 Other chronic pain: Secondary | ICD-10-CM

## 2023-04-29 MED ORDER — METHYLPREDNISOLONE ACETATE 40 MG/ML IJ SUSP
40.0000 mg | INTRAMUSCULAR | Status: AC | PRN
  Administered 2023-04-29: 40 mg via INTRA_ARTICULAR

## 2023-04-29 MED ORDER — LIDOCAINE HCL 1 % IJ SOLN
3.0000 mL | INTRAMUSCULAR | Status: AC | PRN
  Administered 2023-04-29: 3 mL

## 2023-04-29 MED ORDER — METHYLPREDNISOLONE ACETATE 40 MG/ML IJ SUSP
40.0000 mg | Freq: Once | INTRAMUSCULAR | Status: AC
Start: 2023-04-29 — End: 2023-04-29
  Administered 2023-04-29: 40 mg via INTRAMUSCULAR

## 2023-04-29 MED ORDER — METHYLPREDNISOLONE ACETATE 40 MG/ML IJ SUSP
40.0000 mg | INTRAMUSCULAR | Status: AC | PRN
Start: 2023-04-29 — End: 2023-04-29
  Administered 2023-04-29: 40 mg via INTRA_ARTICULAR

## 2023-04-29 MED ORDER — METHYLPREDNISOLONE ACETATE 40 MG/ML IJ SUSP
40.0000 mg | Freq: Once | INTRAMUSCULAR | Status: AC
  Administered 2023-04-29: 40 mg via INTRAMUSCULAR

## 2023-04-29 NOTE — Progress Notes (Signed)
 Office Visit Note   Patient: Kristine Kelly           Date of Birth: [birth year]           MRN: [id number] Visit Date: 04/29/2023              Requested by: Shashana Fullington, [place], [place] [address] [place],  [place] [address] PCP: Rema Fendt, NP  Patient is a pleasant 53 year old woman who I have seen in the past for hip pain and left knee pain and low back pain.  She comes in today complaining actually of pain mostly in her right groin and her right knee.  She said her left knee is not much better as well.  We talked about different things she could try.  I think trying injections in her knees might be helpful to her today.  I will also give more definition if these are the main sources of her pain.  She does have arthritis by x-ray on her right hip but does not seem to mind logrolling and is not very stiff.  Like to see her back in 3 weeks Assessment & Plan: Visit Diagnoses:  1. Chronic pain of right knee   2. Bilateral chronic knee pain     Plan: Bilateral knee pain.  Went forward with injections today.  Will follow-up in 3 weeks  Follow-Up Instructions: Return in about 3 weeks (around 05/20/2023).   Orders:  Orders Placed This Encounter  Procedures  . XR KNEE 3 VIEW RIGHT   No orders of the defined types were placed in this encounter.     Procedures: Large Joint Inj: bilateral knee on 04/29/2023 3:25 PM Indications: pain and diagnostic evaluation Details: 25 G 1.5 in needle, anteromedial approach  Arthrogram: No  Medications (Right): 3 mL lidocaine 1 %; 40 mg methylPREDNISolone acetate 40 MG/ML Medications (Left): 3 mL lidocaine 1 %; 40 mg methylPREDNISolone acetate 40 MG/ML Outcome: tolerated well, no immediate complications Procedure, treatment alternatives, risks and benefits explained, specific risks discussed. Consent was given by the patient.     Clinical Data: No additional findings.   Subjective: Right knee pain  HPI Pleasant 53 year old woman complains of  bilateral right greater than left knee pain and right hip pain.  She has had the left knee evaluated before.  This is gotten a little bit better however now she has pain more in the right knee.  She is not sure if this is compensatory.  She denies any paresthesias or weakness also complains of some right groin pain Review of Systems  All other systems reviewed and are negative.    Objective: Vital Signs: LMP 09/22/2020   Physical Exam Constitutional:      Appearance: Normal appearance.  Pulmonary:     Effort: Pulmonary effort is normal.  Neurological:     General: No focal deficit present.     Mental Status: She is alert and oriented to person, place, and time.  Psychiatric:        Mood and Affect: Mood normal.        Behavior: Behavior normal.    Ortho Exam Bilateral knees no effusion no erythema compartments are soft and compressible she is neurovascularly intact good strength with dorsiflexion plantarflexion extension and flexion of her legs.  Negative Denna Haggard' sign she has good internal/external rotation of her right hip Specialty Comments:  Janeth Rase, MD - 12/24/2022  Formatting of this note might be different from the original.  CLINICAL DATA:  Back pain, T12 compression fracture.   EXAM:  MRI THORACIC SPINE WITHOUT CONTRAST   TECHNIQUE:  Multiplanar, multisequence MR imaging of the thoracic spine was  performed. No intravenous contrast was administered.   COMPARISON:  None Available.   FINDINGS:  Alignment: Physiologic.   Vertebrae: No acute fracture, evidence of discitis, or aggressive  bone lesion. Small hemangioma in the T5 vertebral body.   Cord:  Normal signal and morphology.   Paraspinal and other soft tissues: No acute paraspinal abnormality.   Disc levels:   Disc spaces:  Disc spaces are maintained.   Degenerative disease with mild disc height loss at T5-6, T6-7, T7-8  and T10-11.   T1-T2: No disc protrusion, foraminal stenosis or central  canal  stenosis.   T2-T3: No disc protrusion, foraminal stenosis or central canal  stenosis.   T3-T4: No disc protrusion, foraminal stenosis or central canal  stenosis.   T4-T5: No disc protrusion, foraminal stenosis or central canal  stenosis.   T5-T6: No disc protrusion, foraminal stenosis or central canal  stenosis.   T6-T7: Moderate right paracentral disc protrusion with inferior  migration of disc material contacting the ventral cervical spinal  cord. No foraminal or central canal stenosis.   T7-T8: No disc protrusion, foraminal stenosis or central canal  stenosis.   T8-T9: Small central disc protrusion. No foraminal or central canal  stenosis.   T9-T10: No disc protrusion, foraminal stenosis or central canal  stenosis.   T10-T11: No disc protrusion, foraminal stenosis or central canal  stenosis.   T11-T12: No disc protrusion, foraminal stenosis or central canal  stenosis.   IMPRESSION:  1. No acute osseous injury of the thoracic spine.  2. At T6-7 there is a moderate right paracentral disc protrusion  with inferior migration of disc material contacting the ventral  cervical spinal cord.  3. At T8-9 there is a small central disc protrusion.  4. No foraminal or central canal stenosis of the thoracic spine.    Electronically Signed    By: Elige Ko M.D.    On: 12/24/2022 14:04  Imaging: XR KNEE 3 VIEW RIGHT Result Date: 04/29/2023 Radiographs of the right knee demonstrate no evidence of fracture she does have some degenerative changes similar to her left    PMFS History: Patient Active Problem List   Diagnosis Date Noted  . Bilateral chronic knee pain 04/29/2023  . Osteoarthritis, multiple sites 11/12/2022  . Pain in right hip 11/12/2022  . Numbness and tingling 11/12/2022  . Post-operative state 11/21/2020  . H/O vaginal hysterectomy 11/21/2020  . Vaginal discharge 10/05/2020  . Menopausal symptoms 09/01/2020  . Dermatophytosis 05/25/2020  .  Obesity 04/28/2018  . Anxiety and depression 10/04/2016   Past Medical History:  Diagnosis Date  . Anxiety    NO RECENT ANXIETY ATTACKS PER PT ON 10-04-2020  . Arthritis    OA LEFT KNEE  . Claustrophobia 10/04/2020  . COVID-19 2020   COUGH SOB X 6 WEEKS ALL SYMPTOMS RESOLVED  . GERD (gastroesophageal reflux disease)   . Headache   . Hypertension    not currently on medications  . Wears glasses 10/04/2020    Family History  Problem Relation Age of Onset  . Asthma Mother     Past Surgical History:  Procedure Laterality Date  . BREAST LUMPECTOMY Right 1991   BENIGN  . CHOLECYSTECTOMY  2006   LAPAROSCOPIC  . DILATATION AND CURETTAGE/HYSTEROSCOPY WITH MINERVA N/A 07/30/2018   Procedure: DILATATION AND CURETTAGE/HYSTEROSCOPY WITH MINERVA;  Surgeon: Marice Potter,  Leanora Ivanoff, MD;  Location: [facility];  Service: Gynecology;  Laterality: N/A;  . TUBAL LIGATION     21 YRS AGO PER PT ON 10-04-2020  . VAGINAL HYSTERECTOMY  10/11/2020   Procedure: TOTAL HYSTERECTOMY VAGINAL with RIGHT SALPINGECTOMY;  Surgeon: Hermina Staggers, MD;  Location: [facility];  Service: Gynecology;;   Social History   Occupational History  . Not on file  Tobacco Use  . Smoking status: Never    Passive exposure: Never  . Smokeless tobacco: Never  Vaping Use  . Vaping status: Never Used  Substance and Sexual Activity  . Alcohol use: No  . Drug use: No  . Sexual activity: Yes    Birth control/protection: Surgical

## 2023-04-29 NOTE — Addendum Note (Signed)
 Addended by: Michaele Offer on: 04/29/2023 03:40 PM   Modules accepted: Orders

## 2023-05-17 ENCOUNTER — Ambulatory Visit (INDEPENDENT_AMBULATORY_CARE_PROVIDER_SITE_OTHER): Payer: Commercial Managed Care - PPO | Admitting: Family

## 2023-05-17 ENCOUNTER — Encounter: Payer: Self-pay | Admitting: Family

## 2023-05-17 VITALS — BP 132/87 | HR 66 | Temp 97.9°F | Resp 18 | Ht 67.0 in | Wt 273.8 lb

## 2023-05-17 DIAGNOSIS — Z7689 Persons encountering health services in other specified circumstances: Secondary | ICD-10-CM | POA: Diagnosis not present

## 2023-05-17 DIAGNOSIS — I10 Essential (primary) hypertension: Secondary | ICD-10-CM

## 2023-05-17 DIAGNOSIS — R635 Abnormal weight gain: Secondary | ICD-10-CM | POA: Diagnosis not present

## 2023-05-17 DIAGNOSIS — Z6841 Body Mass Index (BMI) 40.0 and over, adult: Secondary | ICD-10-CM

## 2023-05-17 MED ORDER — VALSARTAN 40 MG PO TABS: ORAL_TABLET | ORAL | 0 refills | Status: AC

## 2023-05-17 NOTE — Progress Notes (Signed)
 Patient ID: Kristine Kelly, female    DOB: [birth year]  MRN: [id number]  CC: Chronic Conditions Follow-Up  Subjective: Kristine Kelly is a 53 y.o. female who presents for chronic conditions follow-up.   Her concerns today include:  - Doing well on Valsartan , no issues/concerns. She does not complain of red flag symptoms such as but not limited to chest pain, shortness of breath, worst headache of life, nausea/vomiting.  - Requests referral to weight specialist. She is trying to watch what she eats. Exercise limit due to orthopedic-related conditions.  - Established with Orthopedics for chronic conditions.   Patient Active Problem List   Diagnosis Date Noted   Bilateral chronic knee pain 04/29/2023   Osteoarthritis, multiple sites 11/12/2022   Pain in right hip 11/12/2022   Numbness and tingling 11/12/2022   Post-operative state 11/21/2020   H/O vaginal hysterectomy 11/21/2020   Vaginal discharge 10/05/2020   Menopausal symptoms 09/01/2020   Dermatophytosis 05/25/2020   Obesity 04/28/2018   Anxiety and depression 10/04/2016     Current Outpatient Medications on File Prior to Visit  Medication Sig Dispense Refill   hydrOXYzine  (ATARAX /VISTARIL ) 25 MG tablet Take 1 tablet (25 mg total) by mouth every 6 (six) hours as needed for anxiety. (Patient not taking: Reported on 02/08/2023) 30 tablet 0   ibuprofen  (ADVIL ) 800 MG tablet Take 1 tablet (800 mg total) by mouth 3 (three) times daily. (Patient not taking: Reported on 02/08/2023) 30 tablet 0   meloxicam  (MOBIC ) 15 MG tablet Take 1 tablet (15 mg total) by mouth daily. (Patient not taking: Reported on 02/08/2023) 30 tablet 2   methocarbamol  (ROBAXIN ) 500 MG tablet Take 2 tablets (1,000 mg total) by mouth 4 (four) times daily. Prn muscle spasm (Patient not taking: Reported on 02/08/2023) 90 tablet 1   metroNIDAZOLE  (METROGEL ) 0.75 % vaginal gel Place 1 Applicatorful vaginally at bedtime. Apply one applicatorful to vagina at bedtime for 5 days  (Patient not taking: Reported on 02/08/2023) 70 g 1   omeprazole  (PRILOSEC) 20 MG capsule Take 20 mg by mouth as needed. (Patient not taking: Reported on 01/28/2023)     Current Facility-Administered Medications on File Prior to Visit  Medication Dose Route Frequency Provider Last Rate Last Admin   triamcinolone  acetonide (KENALOG -40) injection 40 mg  40 mg Intramuscular Once Rogerio Clay, Shahidah Nesbitt J, NP       triamcinolone  acetonide (KENALOG -40) injection 40 mg  40 mg Intramuscular Once         Allergies  Allergen Reactions   Aspirin Other (See Comments)    HEADACHE, DIZZINESS   Atenolol Other (See Comments)    HEADACHES DIZZINESS    Social History   Socioeconomic History   Marital status: Single    Spouse name: Not on file   Number of children: Not on file   Years of education: Not on file   Highest education level: Not on file  Occupational History   Not on file  Tobacco Use   Smoking status: Never    Passive exposure: Never   Smokeless tobacco: Never  Vaping Use   Vaping status: Never Used  Substance and Sexual Activity   Alcohol use: No   Drug use: No   Sexual activity: Yes    Birth control/protection: Surgical  Other Topics Concern   Not on file  Social History Narrative   Not on file   Social Drivers of Health   Financial Resource Strain: Medium Risk (02/08/2023)   Overall Financial Resource Strain (CARDIA)  Difficulty of Paying Living Expenses: Somewhat hard  Food Insecurity: No Food Insecurity (05/17/2023)   Hunger Vital Sign    Worried About Running Out of Food in the Last Year: Never true    Ran Out of Food in the Last Year: Never true  Transportation Needs: No Transportation Needs (05/17/2023)   PRAPARE - Administrator, Civil Service (Medical): No    Lack of Transportation (Non-Medical): No  Physical Activity: Inactive (02/08/2023)   Exercise Vital Sign    Days of Exercise per Week: 0 days    Minutes of Exercise per Session: 0 min  Stress: No  Stress Concern Present (02/08/2023)   [employer] of Occupational Health - Occupational Stress Questionnaire    Feeling of Stress : Only a little  Social Connections: Moderately Isolated (02/08/2023)   Social Connection and Isolation Panel [NHANES]    Frequency of Communication with Friends and Family: Three times a week    Frequency of Social Gatherings with Friends and Family: Three times a week    Attends Religious Services: More than 4 times per year    Active Member of Clubs or Organizations: No    Attends Banker Meetings: Never    Marital Status: Divorced  Catering manager Violence: Not At Risk (02/08/2023)   Humiliation, Afraid, Rape, and Kick questionnaire    Fear of Current or Ex-Partner: No    Emotionally Abused: No    Physically Abused: No    Sexually Abused: No    Family History  Problem Relation Age of Onset   Asthma Mother     Past Surgical History:  Procedure Laterality Date   BREAST LUMPECTOMY Right 1991   BENIGN   CHOLECYSTECTOMY  2006   LAPAROSCOPIC   DILATATION AND CURETTAGE/HYSTEROSCOPY WITH MINERVA N/A 07/30/2018   Procedure: DILATATION AND CURETTAGE/HYSTEROSCOPY WITH MINERVA;  Surgeon: Ana Balling, MD;  Location: [facility];  Service: Gynecology;  Laterality: N/A;   TUBAL LIGATION     21 YRS AGO PER PT ON 10-04-2020   VAGINAL HYSTERECTOMY  10/11/2020   Procedure: TOTAL HYSTERECTOMY VAGINAL with RIGHT SALPINGECTOMY;  Surgeon: Othelia Blinks, MD;  Location: [facility];  Service: Gynecology;;    ROS: Review of Systems Negative except as stated above  PHYSICAL EXAM: BP 132/87   Pulse 66   Temp 97.9 F (36.6 C) (Oral)   Resp 18   Ht 5\' 7"  (1.702 m)   Wt 273 lb 12.8 oz (124.2 kg)   LMP 09/22/2020   SpO2 95%   BMI 42.88 kg/m   Physical Exam HENT:     Head: Normocephalic and atraumatic.     Nose: Nose normal.     Mouth/Throat:     Mouth: Mucous membranes are moist.     Pharynx: Oropharynx is  clear.  Eyes:     Extraocular Movements: Extraocular movements intact.     Conjunctiva/sclera: Conjunctivae normal.     Pupils: Pupils are equal, round, and reactive to light.  Cardiovascular:     Rate and Rhythm: Normal rate and regular rhythm.     Pulses: Normal pulses.     Heart sounds: Normal heart sounds.  Pulmonary:     Effort: Pulmonary effort is normal.     Breath sounds: Normal breath sounds.  Musculoskeletal:        General: Normal range of motion.     Cervical back: Normal range of motion and neck supple.  Neurological:     General:  No focal deficit present.     Mental Status: She is alert and oriented to person, place, and time.  Psychiatric:        Mood and Affect: Mood normal.        Behavior: Behavior normal.      ASSESSMENT AND PLAN: 1. Primary hypertension (Primary) - Continue Valsartan  as prescribed.  - Routine screening.  - Counseled on blood pressure goal of less than 130/80, low-sodium, DASH diet, medication compliance, and 150 minutes of moderate intensity exercise per week as tolerated. Counseled on medication adherence and adverse effects. - Follow-up with primary provider in 3 months or sooner if needed.  - valsartan  (DIOVAN ) 40 MG tablet; Take half tablet (20 mg total) daily by mouth.  Dispense: 90 tablet; Refill: 0 - Basic Metabolic Panel  2. Encounter for weight management 3. BMI 40.0-44.9, adult (HCC) 4. Weight gain - Referral to Medical Weight Management for evaluation/management. - Amb Ref to Medical Weight Management    Patient was given the opportunity to ask questions.  Patient verbalized understanding of the plan and was able to repeat key elements of the plan. Patient was given clear instructions to go to Emergency Department or return to medical center if symptoms don't improve, worsen, or new problems develop.The patient verbalized understanding.   Orders Placed This Encounter  Procedures   Basic Metabolic Panel   Amb Ref to  Medical Weight Management     Requested Prescriptions   Signed Prescriptions Disp Refills   valsartan  (DIOVAN ) 40 MG tablet 90 tablet 0    Sig: Take half tablet (20 mg total) daily by mouth.    Follow-up with primary provider as scheduled.  Senaida Dama, NP

## 2023-05-17 NOTE — Progress Notes (Deleted)
 Pinky toe has been hurting off and on for the last 6 months.  Fell and twisted left knee needs a referral to sports medicine

## 2023-05-17 NOTE — Progress Notes (Signed)
 3 month follow up, patient just got shots in both knees however it made the right one worst

## 2023-05-18 LAB — BASIC METABOLIC PANEL WITH GFR
BUN/Creatinine Ratio: 16 (ref 9–23)
BUN: 15 mg/dL (ref 6–24)
CO2: 17 mmol/L — ABNORMAL LOW (ref 20–29)
Calcium: 9.4 mg/dL (ref 8.7–10.2)
Chloride: 103 mmol/L (ref 96–106)
Creatinine, Ser: 0.94 mg/dL (ref 0.57–1.00)
Glucose: 66 mg/dL — ABNORMAL LOW (ref 70–99)
Potassium: 4.7 mmol/L (ref 3.5–5.2)
Sodium: 138 mmol/L (ref 134–144)
eGFR: 73 mL/min/{1.73_m2} (ref >59)

## 2023-05-20 ENCOUNTER — Encounter: Payer: Self-pay | Admitting: Family

## 2023-05-20 ENCOUNTER — Encounter (INDEPENDENT_AMBULATORY_CARE_PROVIDER_SITE_OTHER): Payer: Self-pay

## 2023-05-21 ENCOUNTER — Ambulatory Visit: Admitting: Physician Assistant

## 2023-05-31 ENCOUNTER — Ambulatory Visit (INDEPENDENT_AMBULATORY_CARE_PROVIDER_SITE_OTHER): Admitting: Physician Assistant

## 2023-05-31 ENCOUNTER — Encounter: Payer: Self-pay | Admitting: Physician Assistant

## 2023-05-31 ENCOUNTER — Other Ambulatory Visit: Payer: Self-pay

## 2023-05-31 DIAGNOSIS — M150 Primary generalized (osteo)arthritis: Secondary | ICD-10-CM | POA: Diagnosis not present

## 2023-05-31 DIAGNOSIS — G8929 Other chronic pain: Secondary | ICD-10-CM

## 2023-05-31 NOTE — Progress Notes (Signed)
 Office Visit Note   Patient: Kristine Kelly           Date of Birth: 1970-12-08           MRN: [id number] Visit Date: 05/31/2023              Requested by: Senaida Dama, NP [address] [place],  [place] [address] PCP: Senaida Dama, NP  Chief Complaint  Patient presents with   Left Knee - Follow-up   Right Knee - Follow-up      HPI: Kristine Kelly comes in today to follow-up on her bilateral knee pain and her right hip pain.  She did have injections at her last visit.  She said she did not think they were helping but most recently her knees are feeling a little bit better.  She still has some hip pain as well.  Assessment & Plan: Visit Diagnoses:  1. Primary osteoarthritis involving multiple joints     Plan: Her knees are doing a little bit better I think she has a lot of patellofemoral issues.  I think she really would do well with therapy and she is willing to do this we will follow-up in a month for her hip she like to concentrate on her knees but in the future could try an injection with Dr. Vaughn Georges  Follow-Up Instructions: Return in about 1 month (around 07/01/2023).   Ortho Exam  Patient is alert, oriented, no adenopathy, well-dressed, normal affect, normal respiratory effort. Bilateral knees she has crepitus bilaterally with range of motion left greater than right no erythema no effusion compartments are soft and nontender  Imaging: No results found. No images are attached to the encounter.  Labs: Lab Results  Component Value Date   HGBA1C 5.6 10/18/2021   HGBA1C 5.7 (H) 05/30/2020   HGBA1C CANCELED 05/25/2020   ESRSEDRATE 33 (H) 11/13/2022     Lab Results  Component Value Date   ALBUMIN 4.3 08/22/2020   ALBUMIN 4.0 03/26/2020   ALBUMIN 4.1 12/18/2019    No results found for: "MG" No results found for: "VD25OH"  No results found for: "PREALBUMIN"    Latest Ref Rng & Units 10/11/2020   11:14 PM 10/07/2020    3:02 PM 08/22/2020    7:53 AM   CBC EXTENDED  WBC 4.0 - 10.5 K/uL 11.6  7.6  6.7   RBC 3.87 - 5.11 MIL/uL 4.03  4.46  4.85   Hemoglobin 12.0 - 15.0 g/dL 62.1  30.8  65.7   HCT 36.0 - 46.0 % 35.3  40.0  42.6   Platelets 150 - 400 K/uL 198  218  231   NEUT# 1.7 - 7.7 K/uL   5.0   Lymph# 0.7 - 4.0 K/uL   1.2      There is no height or weight on file to calculate BMI.  Orders:  No orders of the defined types were placed in this encounter.  No orders of the defined types were placed in this encounter.    Procedures: No procedures performed  Clinical Data: No additional findings.  ROS:  All other systems negative, except as noted in the HPI. Review of Systems  Objective: Vital Signs: LMP 09/22/2020   Specialty Comments:  Bryce Captain, MD - 12/24/2022  Formatting of this note might be different from the original.  CLINICAL DATA:  Back pain, T12 compression fracture.   EXAM:  MRI THORACIC SPINE WITHOUT CONTRAST   TECHNIQUE:  Multiplanar, multisequence MR imaging  of the thoracic spine was  performed. No intravenous contrast was administered.   COMPARISON:  None Available.   FINDINGS:  Alignment: Physiologic.   Vertebrae: No acute fracture, evidence of discitis, or aggressive  bone lesion. Small hemangioma in the T5 vertebral body.   Cord:  Normal signal and morphology.   Paraspinal and other soft tissues: No acute paraspinal abnormality.   Disc levels:   Disc spaces:  Disc spaces are maintained.   Degenerative disease with mild disc height loss at T5-6, T6-7, T7-8  and T10-11.   T1-T2: No disc protrusion, foraminal stenosis or central canal  stenosis.   T2-T3: No disc protrusion, foraminal stenosis or central canal  stenosis.   T3-T4: No disc protrusion, foraminal stenosis or central canal  stenosis.   T4-T5: No disc protrusion, foraminal stenosis or central canal  stenosis.   T5-T6: No disc protrusion, foraminal stenosis or central canal  stenosis.   T6-T7: Moderate  right paracentral disc protrusion with inferior  migration of disc material contacting the ventral cervical spinal  cord. No foraminal or central canal stenosis.   T7-T8: No disc protrusion, foraminal stenosis or central canal  stenosis.   T8-T9: Small central disc protrusion. No foraminal or central canal  stenosis.   T9-T10: No disc protrusion, foraminal stenosis or central canal  stenosis.   T10-T11: No disc protrusion, foraminal stenosis or central canal  stenosis.   T11-T12: No disc protrusion, foraminal stenosis or central canal  stenosis.   IMPRESSION:  1. No acute osseous injury of the thoracic spine.  2. At T6-7 there is a moderate right paracentral disc protrusion  with inferior migration of disc material contacting the ventral  cervical spinal cord.  3. At T8-9 there is a small central disc protrusion.  4. No foraminal or central canal stenosis of the thoracic spine.    Electronically Signed    By: Onnie Bilis M.D.    On: 12/24/2022 14:04  PMFS History: Patient Active Problem List   Diagnosis Date Noted   Bilateral chronic knee pain 04/29/2023   Osteoarthritis, multiple sites 11/12/2022   Pain in right hip 11/12/2022   Numbness and tingling 11/12/2022   Post-operative state 11/21/2020   H/O vaginal hysterectomy 11/21/2020   Vaginal discharge 10/05/2020   Menopausal symptoms 09/01/2020   Dermatophytosis 05/25/2020   Obesity 04/28/2018   Anxiety and depression 10/04/2016   Past Medical History:  Diagnosis Date   Anxiety    NO RECENT ANXIETY ATTACKS PER PT ON 10-04-2020   Arthritis    OA LEFT KNEE   Claustrophobia 10/04/2020   COVID-19 2020   COUGH SOB X 6 WEEKS ALL SYMPTOMS RESOLVED   GERD (gastroesophageal reflux disease)    Headache    Hypertension    not currently on medications   Wears glasses 10/04/2020    Family History  Problem Relation Age of Onset   Asthma Mother     Past Surgical History:  Procedure Laterality Date   BREAST  LUMPECTOMY Right 1991   BENIGN   CHOLECYSTECTOMY  2006   LAPAROSCOPIC   DILATATION AND CURETTAGE/HYSTEROSCOPY WITH MINERVA N/A 07/30/2018   Procedure: DILATATION AND CURETTAGE/HYSTEROSCOPY WITH MINERVA;  Surgeon: Ana Balling, MD;  Location: [facility];  Service: Gynecology;  Laterality: N/A;   TUBAL LIGATION     21 YRS AGO PER PT ON 10-04-2020   VAGINAL HYSTERECTOMY  10/11/2020   Procedure: TOTAL HYSTERECTOMY VAGINAL with RIGHT SALPINGECTOMY;  Surgeon: Othelia Blinks, MD;  Location: [facility];  Service: Gynecology;;   Social History   Occupational History   Not on file  Tobacco Use   Smoking status: Never    Passive exposure: Never   Smokeless tobacco: Never  Vaping Use   Vaping status: Never Used  Substance and Sexual Activity   Alcohol use: No   Drug use: No   Sexual activity: Yes    Birth control/protection: Surgical

## 2023-06-18 ENCOUNTER — Other Ambulatory Visit: Payer: Self-pay

## 2023-06-18 ENCOUNTER — Ambulatory Visit: Attending: Physician Assistant

## 2023-06-18 DIAGNOSIS — R2689 Other abnormalities of gait and mobility: Secondary | ICD-10-CM | POA: Diagnosis present

## 2023-06-18 DIAGNOSIS — M25561 Pain in right knee: Secondary | ICD-10-CM | POA: Insufficient documentation

## 2023-06-18 DIAGNOSIS — M25562 Pain in left knee: Secondary | ICD-10-CM | POA: Diagnosis present

## 2023-06-18 DIAGNOSIS — G8929 Other chronic pain: Secondary | ICD-10-CM | POA: Insufficient documentation

## 2023-06-18 DIAGNOSIS — M6281 Muscle weakness (generalized): Secondary | ICD-10-CM | POA: Insufficient documentation

## 2023-06-18 NOTE — Therapy (Signed)
 OUTPATIENT PHYSICAL THERAPY LOWER EXTREMITY EVALUATION   Patient Name: Kristine Kelly MRN: [id number] DOB:18-Feb-1970, 53 y.o., female Today's Date: 06/19/2023  END OF SESSION:  PT End of Session - 06/18/23 1459     Visit Number 1    Number of Visits 17    Date for PT Re-Evaluation 08/13/23    Authorization Type [employer]    PT Start Time 1446    PT Stop Time 1525    PT Time Calculation (min) 39 min    Activity Tolerance Patient tolerated treatment well    Behavior During Therapy WFL for tasks assessed/performed             Past Medical History:  Diagnosis Date   Anxiety    NO RECENT ANXIETY ATTACKS PER PT ON 10-04-2020   Arthritis    OA LEFT KNEE   Claustrophobia 10/04/2020   COVID-19 2020   COUGH SOB X 6 WEEKS ALL SYMPTOMS RESOLVED   GERD (gastroesophageal reflux disease)    Headache    Hypertension    not currently on medications   Wears glasses 10/04/2020   Past Surgical History:  Procedure Laterality Date   BREAST LUMPECTOMY Right 1991   BENIGN   CHOLECYSTECTOMY  2006   LAPAROSCOPIC   DILATATION AND CURETTAGE/HYSTEROSCOPY WITH MINERVA N/A 07/30/2018   Procedure: DILATATION AND CURETTAGE/HYSTEROSCOPY WITH MINERVA;  Surgeon: Ana Balling, MD;  Location: [facility];  Service: Gynecology;  Laterality: N/A;   TUBAL LIGATION     21 YRS AGO PER PT ON 10-04-2020   VAGINAL HYSTERECTOMY  10/11/2020   Procedure: TOTAL HYSTERECTOMY VAGINAL with RIGHT SALPINGECTOMY;  Surgeon: Othelia Blinks, MD;  Location: [facility];  Service: Gynecology;;   Patient Active Problem List   Diagnosis Date Noted   Bilateral chronic knee pain 04/29/2023   Osteoarthritis, multiple sites 11/12/2022   Pain in right hip 11/12/2022   Numbness and tingling 11/12/2022   Post-operative state 11/21/2020   H/O vaginal hysterectomy 11/21/2020   Vaginal discharge 10/05/2020   Menopausal symptoms 09/01/2020   Dermatophytosis 05/25/2020   Obesity 04/28/2018   Anxiety and  depression 10/04/2016    PCP: Senaida Dama, NP  REFERRING PROVIDER: Persons, Norma Beckers, PA   REFERRING DIAG: M25.561,M25.562,G89.29 (ICD-10-CM) - Bilateral chronic knee pain   THERAPY DIAG:  Chronic pain of both knees  Muscle weakness (generalized)  Other abnormalities of gait and mobility  Rationale for Evaluation and Treatment: Rehabilitation  ONSET DATE: Chronic  SUBJECTIVE:   SUBJECTIVE STATEMENT: Pt is well known to therapist and presents to PT with reports of chronic bilateral knee and R hip pain. Also has pain in multiple bilateral joint sites, saw rheumatology last year but never heard back about results of labs. 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 Denies N/T or any paresthesias down LE.   PERTINENT HISTORY: HTN, see other PMH   PAIN:  Are you having pain?  Yes: NPRS scale: 7/10 Worst: 10/10 Pain location: bilateral knee Pain description: sharp, sore Aggravating factors: prolonged standing, squatting, stairs Relieving factors: rest, medication  PRECAUTIONS: None  RED FLAGS: None   WEIGHT BEARING RESTRICTIONS: No  FALLS:  Has patient fallen in last 6 months? Yes. Number of falls - 1  LIVING ENVIRONMENT: Lives with: lives with their family Lives in: House/apartment Stairs: Yes: External: 13 steps; bilateral but cannot reach both Has following equipment at home: None  OCCUPATION: Product manager  PLOF: Independent  PATIENT GOALS: decrease knee pain, improve functional mobility with stairs and work  NEXT MD VISIT: 07/05/2023  OBJECTIVE:  Note: Objective measures were completed at Evaluation unless otherwise noted.  DIAGNOSTIC FINDINGS: See imaging  PATIENT SURVEYS:  LEFS: 27/80  COGNITION: Overall cognitive status: Within functional limits for tasks assessed     SENSATION: WFL  POSTURE: rounded shoulders, forward head, and knee  vaglus  PALPATION: TTP to bilateral patellar tendon, bilateral patellar hypomobility  LOWER EXTREMITY ROM:  Active ROM Right eval Left eval  Hip flexion    Hip extension    Hip abduction    Hip adduction    Hip internal rotation    Hip external rotation    Knee flexion 115 125  Knee extension 0 0  Ankle dorsiflexion    Ankle plantarflexion    Ankle inversion    Ankle eversion     (Blank rows = not tested)  LOWER EXTREMITY MMT:  MMT Right eval Left eval  Hip flexion 4 4  Hip extension    Hip abduction 3 3  Hip adduction    Hip internal rotation    Hip external rotation    Knee flexion 4 4  Knee extension 4 p! 4  Ankle dorsiflexion    Ankle plantarflexion    Ankle inversion    Ankle eversion     (Blank rows = not tested)  LOWER EXTREMITY SPECIAL TESTS:  Knee special tests: Posterior drawer test: negative and Lachman Test: negative  FUNCTIONAL TESTS:  30 Second Sit to Stand: 4 reps with UE  GAIT: Distance walked: 76ft Assistive device utilized: None Level of assistance: Complete Independence Comments: antalgic gait, decreased gait speed   TREATMENT: [facility] Adult PT Treatment:                                                DATE: 06/18/2023 Therapeutic Exercise: Supine QS x 5 - 5" hold Supine SLR x 5 each S/L clamshell x 5 each RTB  PATIENT EDUCATION:  Education details: eval findings, LEFS, HEP, POC Person educated: Patient Education method: Explanation, Demonstration, and Handouts Education comprehension: verbalized understanding and returned demonstration  HOME EXERCISE PROGRAM: Access Code: [email or web address] URL: [email or web address] Date: 06/18/2023 Prepared by: Loral Roch  Exercises - Supine Quadricep Sets  - 1 x daily - 7 x weekly - 2 sets - 10 reps - 5 sec hold - Active Straight Leg Raise with Quad Set  - 1 x daily - 7 x weekly - 2-3 sets - 5 reps - Clamshell with Resistance  - 1 x daily - 7 x weekly - 3 sets - 10 reps - red band  hold  ASSESSMENT:  CLINICAL IMPRESSION: Patient is a 53 y.o. F who was seen today for physical therapy evaluation and treatment for chronic bilateral knee pain with occasional R hip pain as well. Physical findings are consistent with referring provider impression as pt demonstrates decrease in quad and proximal hip strength and general functional mobility. LEFS score demonstrates severe disability in performance of home ADLs and higher level community activities. Pt would benefit from skilled PT services working on improving quad and hip strength in order to decrease knee pain.   OBJECTIVE IMPAIRMENTS: Abnormal gait, decreased activity tolerance, decreased endurance, decreased mobility, difficulty walking, decreased strength, and pain   ACTIVITY LIMITATIONS: carrying, lifting, sitting, standing, squatting, stairs,  transfers, and locomotion level  PARTICIPATION LIMITATIONS: meal prep, cleaning, driving, shopping, community activity, occupation, and yard work  PERSONAL FACTORS: Time since onset of injury/illness/exacerbation and 1-2 comorbidities: HTN, see other PMH  are also affecting patient's functional outcome.   REHAB POTENTIAL: Good  CLINICAL DECISION MAKING: Stable/uncomplicated  EVALUATION COMPLEXITY: Low   GOALS: Goals reviewed with patient? No  SHORT TERM GOALS: Target date: 07/09/2023   Pt will be compliant and knowledgeable with initial HEP for improved comfort and carryover Baseline: initial HEP given  Goal status: INITIAL  2.  Pt will self report bilateral knee pain no greater than 6/10 for improved comfort and functional ability Baseline: 10/10 at worst Goal status: INITIAL   LONG TERM GOALS: Target date: 08/13/2023   Pt will improve LEFS to no less than 40/80 as proxy for functional improvement with home ADLs and higher level community activity Baseline: 27/80 Goal status: INITIAL   2.  Pt will self report bilateral knee pain no greater than 3/10 for improved  comfort and functional ability Baseline: 10/10 at worst Goal status: INITIAL   3.  Pt will increase 30 Second Sit to Stand rep count to no less than 8 reps for improved balance, strength, and functional mobility Baseline: 4 reps with UE  Goal status: INITIAL   4.  Pt will improve bilateral LE MMT to no less than 4/5 for all tested motions with decreased pain for improved functional mobility and stability to bilateral knees Baseline: see MMT chart Goal status: INITIAL   PLAN:  PT FREQUENCY: 1-2x/week  PT DURATION: 8 weeks  PLANNED INTERVENTIONS: 97164- PT Re-evaluation, 97110-Therapeutic exercises, 97530- Therapeutic activity, 97112- Neuromuscular re-education, 97535- Self Care, 40981- Manual therapy, [id number]- Gait training, [phone]- Aquatic Therapy, [phone]- Electrical stimulation (unattended), [id number]- Electrical stimulation (manual), 97016- Vasopneumatic device, Cryotherapy, and Moist heat  PLAN FOR NEXT SESSION: assess HEP response, quad and hip strengthening in gym and pool    Ivor Mars, PT 06/19/2023, 7:56 AM

## 2023-06-24 ENCOUNTER — Ambulatory Visit: Admitting: Obstetrics & Gynecology

## 2023-06-24 ENCOUNTER — Other Ambulatory Visit (HOSPITAL_COMMUNITY)
Admission: RE | Admit: 2023-06-24 | Discharge: 2023-06-24 | Disposition: A | Discharge status: Home / Self Care | Source: Ambulatory Visit | Attending: Obstetrics & Gynecology | Admitting: Obstetrics & Gynecology

## 2023-06-24 ENCOUNTER — Encounter: Payer: Self-pay | Admitting: Obstetrics & Gynecology

## 2023-06-24 VITALS — BP 144/99 | HR 79 | Ht 67.0 in | Wt 273.0 lb

## 2023-06-24 DIAGNOSIS — Z1331 Encounter for screening for depression: Secondary | ICD-10-CM | POA: Diagnosis not present

## 2023-06-24 DIAGNOSIS — Z113 Encounter for screening for infections with a predominantly sexual mode of transmission: Secondary | ICD-10-CM | POA: Insufficient documentation

## 2023-06-24 DIAGNOSIS — N951 Menopausal and female climacteric states: Secondary | ICD-10-CM | POA: Diagnosis not present

## 2023-06-24 DIAGNOSIS — N898 Other specified noninflammatory disorders of vagina: Secondary | ICD-10-CM | POA: Diagnosis not present

## 2023-06-24 DIAGNOSIS — Z01419 Encounter for gynecological examination (general) (routine) without abnormal findings: Secondary | ICD-10-CM

## 2023-06-24 DIAGNOSIS — Z90710 Acquired absence of both cervix and uterus: Secondary | ICD-10-CM | POA: Diagnosis not present

## 2023-06-24 MED ORDER — ESTRADIOL 1 MG PO TABS: 1.0000 mg | ORAL_TABLET | Freq: Every day | ORAL | 12 refills | Status: AC

## 2023-06-24 NOTE — Progress Notes (Signed)
 GYNECOLOGY CLINIC ANNUAL PREVENTATIVE CARE ENCOUNTER NOTE  Subjective:h/o TVH 09/2020    Kristine Kelly is a 53 y.o. [phone] female here for a routine annual gynecologic exam.  Current complaints: hot flushes vaginal dryness.   Denies abnormal vaginal bleeding, discharge, and request STD screening although not having intercourse very often  Gynecologic History Patient's last menstrual period was 09/22/2020. Contraception: status post hysterectomy Last Pap: 2012. Results were: normal Last mammogram: 2024. Results were: normal  Obstetric History OB History  Gravida Para Term Preterm AB Living  4 3 3  1 3   SAB IAB Ectopic Multiple Live Births   1   3    # Outcome Date GA Lbr Len/2nd Weight Sex Type Anes PTL Lv  4 Term 02/15/00    F Vag-Spont   LIV  3 Term 12/25/90    F Vag-Spont   LIV  2 Term 07/20/88    M Vag-Spont   LIV  1 IAB             Past Medical History:  Diagnosis Date   Anxiety    NO RECENT ANXIETY ATTACKS PER PT ON 10-04-2020   Arthritis    OA LEFT KNEE   Claustrophobia 10/04/2020   COVID-19 2020   COUGH SOB X 6 WEEKS ALL SYMPTOMS RESOLVED   GERD (gastroesophageal reflux disease)    Headache    Hypertension    not currently on medications   Wears glasses 10/04/2020    Past Surgical History:  Procedure Laterality Date   BREAST LUMPECTOMY Right 1991   BENIGN   CHOLECYSTECTOMY  2006   LAPAROSCOPIC   DILATATION AND CURETTAGE/HYSTEROSCOPY WITH MINERVA N/A 07/30/2018   Procedure: DILATATION AND CURETTAGE/HYSTEROSCOPY WITH MINERVA;  Surgeon: Ana Balling, MD;  Location: [facility];  Service: Gynecology;  Laterality: N/A;   TUBAL LIGATION     21 YRS AGO PER PT ON 10-04-2020   VAGINAL HYSTERECTOMY  10/11/2020   Procedure: TOTAL HYSTERECTOMY VAGINAL with RIGHT SALPINGECTOMY;  Surgeon: Othelia Blinks, MD;  Location: [facility];  Service: Gynecology;;    Current Outpatient Medications on File Prior to Visit  Medication Sig Dispense  Refill   hydrOXYzine  (ATARAX /VISTARIL ) 25 MG tablet Take 1 tablet (25 mg total) by mouth every 6 (six) hours as needed for anxiety. (Patient not taking: Reported on 02/08/2023) 30 tablet 0   ibuprofen  (ADVIL ) 800 MG tablet Take 1 tablet (800 mg total) by mouth 3 (three) times daily. (Patient not taking: Reported on 02/08/2023) 30 tablet 0   meloxicam  (MOBIC ) 15 MG tablet Take 1 tablet (15 mg total) by mouth daily. (Patient not taking: Reported on 02/08/2023) 30 tablet 2   methocarbamol  (ROBAXIN ) 500 MG tablet Take 2 tablets (1,000 mg total) by mouth 4 (four) times daily. Prn muscle spasm (Patient not taking: Reported on 02/08/2023) 90 tablet 1   metroNIDAZOLE  (METROGEL ) 0.75 % vaginal gel Place 1 Applicatorful vaginally at bedtime. Apply one applicatorful to vagina at bedtime for 5 days (Patient not taking: Reported on 02/08/2023) 70 g 1   omeprazole  (PRILOSEC) 20 MG capsule Take 20 mg by mouth as needed. (Patient not taking: Reported on 01/28/2023)     valsartan  (DIOVAN ) 40 MG tablet Take half tablet (20 mg total) daily by mouth. 90 tablet 0   Current Facility-Administered Medications on File Prior to Visit  Medication Dose Route Frequency Provider Last Rate Last Admin   triamcinolone  acetonide (KENALOG -40) injection 40 mg  40 mg Intramuscular Once Rogerio Clay, Amy J,  NP       triamcinolone  acetonide (KENALOG -40) injection 40 mg  40 mg Intramuscular Once         Allergies  Allergen Reactions   Aspirin Other (See Comments)    HEADACHE, DIZZINESS   Atenolol Other (See Comments)    HEADACHES DIZZINESS    Social History   Socioeconomic History   Marital status: Single    Spouse name: Not on file   Number of children: Not on file   Years of education: Not on file   Highest education level: Not on file  Occupational History   Not on file  Tobacco Use   Smoking status: Never    Passive exposure: Never   Smokeless tobacco: Never  Vaping Use   Vaping status: Never Used  Substance and Sexual  Activity   Alcohol use: No   Drug use: No   Sexual activity: Yes    Birth control/protection: Surgical  Other Topics Concern   Not on file  Social History Narrative   Not on file   Social Drivers of Health   Financial Resource Strain: Medium Risk (02/08/2023)   Overall Financial Resource Strain (CARDIA)    Difficulty of Paying Living Expenses: Somewhat hard  Food Insecurity: No Food Insecurity (05/17/2023)   Hunger Vital Sign    Worried About Running Out of Food in the Last Year: Never true    Ran Out of Food in the Last Year: Never true  Transportation Needs: No Transportation Needs (05/17/2023)   PRAPARE - Administrator, Civil Service (Medical): No    Lack of Transportation (Non-Medical): No  Physical Activity: Inactive (02/08/2023)   Exercise Vital Sign    Days of Exercise per Week: 0 days    Minutes of Exercise per Session: 0 min  Stress: No Stress Concern Present (02/08/2023)   [employer] of Occupational Health - Occupational Stress Questionnaire    Feeling of Stress : Only a little  Social Connections: Moderately Isolated (02/08/2023)   Social Connection and Isolation Panel [NHANES]    Frequency of Communication with Friends and Family: Three times a week    Frequency of Social Gatherings with Friends and Family: Three times a week    Attends Religious Services: More than 4 times per year    Active Member of Clubs or Organizations: No    Attends Banker Meetings: Never    Marital Status: Divorced  Catering manager Violence: Not At Risk (02/08/2023)   Humiliation, Afraid, Rape, and Kick questionnaire    Fear of Current or Ex-Partner: No    Emotionally Abused: No    Physically Abused: No    Sexually Abused: No    Family History  Problem Relation Age of Onset   Asthma Mother     The following portions of the patient's history were reviewed and updated as appropriate: allergies, current medications, past family history, past medical  history, past social history, past surgical history and problem list.  Review of Systems Pertinent items are noted in HPI.   Objective:  BP (!) 144/99 (BP Location: Right Arm, Cuff Size: Large)   Pulse 79   Ht 5\' 7"  (1.702 m)   Wt 273 lb (123.8 kg)   LMP 09/22/2020   BMI 42.76 kg/m  CONSTITUTIONAL: Well-developed, well-nourished female in no acute distress.  HENT:  Normocephalic, atraumatic, External right and left ear normal. Oropharynx is clear and moist EYES: Conjunctivae and EOM are normal. Pupils are equal, round, and reactive to light.  No scleral icterus.  NECK: Normal range of motion, supple, no masses.  Normal thyroid .  SKIN: Skin is warm and dry. No rash noted. Not diaphoretic. No erythema. No pallor. NEUROLGIC: Alert and oriented to person, place, and time. Normal reflexes, muscle tone coordination. No cranial nerve deficit noted. PSYCHIATRIC: Normal mood and affect. Normal behavior. Normal judgment and thought content. CARDIOVASCULAR: Normal heart rate noted, regular rhythm RESPIRATORY: Clear to auscultation bilaterally. Effort and breath sounds normal, no problems with respiration noted. BREASTS: Symmetric in size. pendulous. No masses, skin changes, nipple drainage, or lymphadenopathy. ABDOMEN: Soft, normal bowel sounds, no distention noted.  No tenderness, rebound or guarding.  PELVIC: Normal appearing external genitalia; normal appearing vaginal mucosa and cervix.  No abnormal discharge noted.  No palpable masses, no adnexal tenderness. MUSCULOSKELETAL: Normal range of motion. No tenderness.  No cyanosis, clubbing, or edema.     Assessment:  Annual gynecologic examination Women's annual routine gynecological examination [Z01.419] - Plan: Cervicovaginal ancillary only( [place]), Hepatitis C Antibody, Hepatitis B Surface AntiGEN, RPR, estradiol (ESTRACE) 1 MG tablet, HIV antibody (with reflex)  History of vaginal hysterectomy  Menopausal symptoms  Vaginal  discharge  H/O vaginal hysterectomy    Plan:  STD screen Mammogram scheduled Routine preventative health maintenance measures emphasized. Please refer to After Visit Summary for other counseling recommendations.  Meds ordered this encounter  Medications   estradiol (ESTRACE) 1 MG tablet    Sig: Take 1 tablet (1 mg total) by mouth daily.    Dispense:  30 tablet    Refill:  12   For menopausal VMS  Onnie Bilis, MD Attending Obstetrician & Gynecologist Center for [facility], [facility]

## 2023-06-24 NOTE — Progress Notes (Addendum)
 53 y.o. New GYN presents for AEX/STD screening.  Pt will call and schedule her Mammogram, last Mammo 07/27/2022 Negative. C/o vaginal dryness, hot flashes, mood swings.  HO hysterectomy 2022.  BP elevated today, she took her meds last night.

## 2023-06-25 ENCOUNTER — Ambulatory Visit: Attending: Physician Assistant

## 2023-06-25 DIAGNOSIS — M25561 Pain in right knee: Secondary | ICD-10-CM | POA: Insufficient documentation

## 2023-06-25 DIAGNOSIS — G8929 Other chronic pain: Secondary | ICD-10-CM | POA: Diagnosis present

## 2023-06-25 DIAGNOSIS — M25562 Pain in left knee: Secondary | ICD-10-CM | POA: Insufficient documentation

## 2023-06-25 DIAGNOSIS — R2689 Other abnormalities of gait and mobility: Secondary | ICD-10-CM | POA: Insufficient documentation

## 2023-06-25 DIAGNOSIS — M546 Pain in thoracic spine: Secondary | ICD-10-CM | POA: Diagnosis present

## 2023-06-25 DIAGNOSIS — M6281 Muscle weakness (generalized): Secondary | ICD-10-CM | POA: Insufficient documentation

## 2023-06-25 LAB — CERVICOVAGINAL ANCILLARY ONLY
Chlamydia: NEGATIVE
Comment: NEGATIVE
Comment: NEGATIVE
Comment: NORMAL
Neisseria Gonorrhea: NEGATIVE
Trichomonas: NEGATIVE

## 2023-06-25 LAB — RPR: RPR Ser Ql: NONREACTIVE

## 2023-06-25 LAB — HEPATITIS C ANTIBODY: Hep C Virus Ab: NONREACTIVE

## 2023-06-25 LAB — HIV ANTIBODY (ROUTINE TESTING W REFLEX): HIV Screen 4th Generation wRfx: NONREACTIVE

## 2023-06-25 LAB — HEPATITIS B SURFACE ANTIGEN: Hepatitis B Surface Ag: NEGATIVE

## 2023-06-25 NOTE — Therapy (Signed)
 OUTPATIENT PHYSICAL THERAPY TREATMENT   Patient Name: Kristine Kelly MRN: [id number] DOB:1970/05/22, 53 y.o., female Today's Date: 06/25/2023  END OF SESSION:  PT End of Session - 06/25/23 1143     Visit Number 2    Number of Visits 17    Date for PT Re-Evaluation 08/13/23    Authorization Type [employer]    PT Start Time 1145    PT Stop Time 1223    PT Time Calculation (min) 38 min    Activity Tolerance Patient tolerated treatment well    Behavior During Therapy WFL for tasks assessed/performed              Past Medical History:  Diagnosis Date   Anxiety    NO RECENT ANXIETY ATTACKS PER PT ON 10-04-2020   Arthritis    OA LEFT KNEE   Claustrophobia 10/04/2020   COVID-19 2020   COUGH SOB X 6 WEEKS ALL SYMPTOMS RESOLVED   GERD (gastroesophageal reflux disease)    Headache    Hypertension    not currently on medications   Wears glasses 10/04/2020   Past Surgical History:  Procedure Laterality Date   BREAST LUMPECTOMY Right 1991   BENIGN   CHOLECYSTECTOMY  2006   LAPAROSCOPIC   DILATATION AND CURETTAGE/HYSTEROSCOPY WITH MINERVA N/A 07/30/2018   Procedure: DILATATION AND CURETTAGE/HYSTEROSCOPY WITH MINERVA;  Surgeon: Ana Balling, MD;  Location: [facility];  Service: Gynecology;  Laterality: N/A;   TUBAL LIGATION     21 YRS AGO PER PT ON 10-04-2020   VAGINAL HYSTERECTOMY  10/11/2020   Procedure: TOTAL HYSTERECTOMY VAGINAL with RIGHT SALPINGECTOMY;  Surgeon: Othelia Blinks, MD;  Location: [facility];  Service: Gynecology;;   Patient Active Problem List   Diagnosis Date Noted   Bilateral chronic knee pain 04/29/2023   Osteoarthritis, multiple sites 11/12/2022   Pain in right hip 11/12/2022   Numbness and tingling 11/12/2022   Post-operative state 11/21/2020   H/O vaginal hysterectomy 11/21/2020   Vaginal discharge 10/05/2020   Menopausal symptoms 09/01/2020   Dermatophytosis 05/25/2020   Obesity 04/28/2018   Anxiety and depression  10/04/2016    PCP: Senaida Dama, NP  REFERRING PROVIDER: Persons, Norma Beckers, PA   REFERRING DIAG: M25.561,M25.562,G89.29 (ICD-10-CM) - Bilateral chronic knee pain   THERAPY DIAG:  Chronic pain of both knees  Muscle weakness (generalized)  Other abnormalities of gait and mobility  Rationale for Evaluation and Treatment: Rehabilitation  ONSET DATE: Chronic  SUBJECTIVE:   SUBJECTIVE STATEMENT: Pt presents to PT with continued knee and R hip pain. Has been compliant with. HEP.   EVAL: Pt is well known to therapist and presents to PT with reports of chronic bilateral knee and R hip pain. Also has pain in multiple bilateral joint sites, saw rheumatology last year but never heard back about results of labs. 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 Denies N/T or any paresthesias down LE.   PERTINENT HISTORY: HTN, see other PMH   PAIN:  Are you having pain?  Yes: NPRS scale: 7/10 Worst: 10/10 Pain location: bilateral knee Pain description: sharp, sore Aggravating factors: prolonged standing, squatting, stairs Relieving factors: rest, medication  PRECAUTIONS: None  RED FLAGS: None   WEIGHT BEARING RESTRICTIONS: No  FALLS:  Has patient fallen in last 6 months? Yes. Number of falls - 1  LIVING ENVIRONMENT: Lives with: lives with their family Lives in: House/apartment Stairs: Yes: External:  13 steps; bilateral but cannot reach both Has following equipment at home: None  OCCUPATION: Product manager   PLOF: Independent  PATIENT GOALS: decrease knee pain, improve functional mobility with stairs and work  NEXT MD VISIT: 07/05/2023  OBJECTIVE:  Note: Objective measures were completed at Evaluation unless otherwise noted.  DIAGNOSTIC FINDINGS: See imaging  PATIENT SURVEYS:  LEFS: 27/80  COGNITION: Overall cognitive status: Within functional limits for tasks  assessed     SENSATION: WFL  POSTURE: rounded shoulders, forward head, and knee vaglus  PALPATION: TTP to bilateral patellar tendon, bilateral patellar hypomobility  LOWER EXTREMITY ROM:  Active ROM Right eval Left eval  Hip flexion    Hip extension    Hip abduction    Hip adduction    Hip internal rotation    Hip external rotation    Knee flexion 115 125  Knee extension 0 0  Ankle dorsiflexion    Ankle plantarflexion    Ankle inversion    Ankle eversion     (Blank rows = not tested)  LOWER EXTREMITY MMT:  MMT Right eval Left eval  Hip flexion 4 4  Hip extension    Hip abduction 3 3  Hip adduction    Hip internal rotation    Hip external rotation    Knee flexion 4 4  Knee extension 4 p! 4  Ankle dorsiflexion    Ankle plantarflexion    Ankle inversion    Ankle eversion     (Blank rows = not tested)  LOWER EXTREMITY SPECIAL TESTS:  Knee special tests: Posterior drawer test: negative and Lachman Test: negative  FUNCTIONAL TESTS:  30 Second Sit to Stand: 4 reps with UE  GAIT: Distance walked: 58ft Assistive device utilized: None Level of assistance: Complete Independence Comments: antalgic gait, decreased gait speed   TREATMENT: [facility] Adult PT Treatment:                                                DATE: 06/25/2023 NuStep lvl 3 UE/LE x 4 min for functional activity tolerance Supine QS x 10 - 5" hold Supine SLR 2x10each Hooklying clamshell 2x15 blue band Bridge - attempted increased pain LTR x 5 each Hooklying ball squeeze 2x10 - 5" hold SAQ 2x10 2#  [facility] Adult PT Treatment:                                                DATE: 06/18/2023 Therapeutic Exercise: Supine QS x 5 - 5" hold Supine SLR x 5 each S/L clamshell x 5 each RTB  PATIENT EDUCATION:  Education details: eval findings, LEFS, HEP, POC Person educated: Patient Education method: Explanation, Demonstration, and Handouts Education comprehension: verbalized understanding and returned  demonstration  HOME EXERCISE PROGRAM: Access Code: [email or web address] URL: [email or web address] Date: 06/18/2023 Prepared by: Loral Roch  Exercises - Supine Quadricep Sets  - 1 x daily - 7 x weekly - 2 sets - 10 reps - 5 sec hold - Active Straight Leg Raise with Quad Set  - 1 x daily - 7 x weekly - 2-3 sets - 5 reps - Clamshell with Resistance  - 1 x daily - 7 x weekly - 3 sets - 10 reps - red band hold  ASSESSMENT:  CLINICAL IMPRESSION: Pt was able to complete prescribed exercises but continued to have hip and knee pain during treatment. Exercises focused on improving hip and quad strength in order to decrease pain and improving function. Pt is progressing with therapy, will begin trial of aquatic therapy tomorrow and continue to progress as able.   EVAL: Patient is a 53 y.o. F who was seen today for physical therapy evaluation and treatment for chronic bilateral knee pain with occasional R hip pain as well. Physical findings are consistent with referring provider impression as pt demonstrates decrease in quad and proximal hip strength and general functional mobility. LEFS score demonstrates severe disability in performance of home ADLs and higher level community activities. Pt would benefit from skilled PT services working on improving quad and hip strength in order to decrease knee pain.   OBJECTIVE IMPAIRMENTS: Abnormal gait, decreased activity tolerance, decreased endurance, decreased mobility, difficulty walking, decreased strength, and pain   ACTIVITY LIMITATIONS: carrying, lifting, sitting, standing, squatting, stairs, transfers, and locomotion level  PARTICIPATION LIMITATIONS: meal prep, cleaning, driving, shopping, community activity, occupation, and yard work  PERSONAL FACTORS: Time since onset of injury/illness/exacerbation and 1-2 comorbidities: HTN, see other PMH  are also affecting patient's functional outcome.   REHAB POTENTIAL: Good  CLINICAL DECISION MAKING:  Stable/uncomplicated  EVALUATION COMPLEXITY: Low   GOALS: Goals reviewed with patient? No  SHORT TERM GOALS: Target date: 07/09/2023   Pt will be compliant and knowledgeable with initial HEP for improved comfort and carryover Baseline: initial HEP given  Goal status: INITIAL  2.  Pt will self report bilateral knee pain no greater than 6/10 for improved comfort and functional ability Baseline: 10/10 at worst Goal status: INITIAL   LONG TERM GOALS: Target date: 08/13/2023   Pt will improve LEFS to no less than 40/80 as proxy for functional improvement with home ADLs and higher level community activity Baseline: 27/80 Goal status: INITIAL   2.  Pt will self report bilateral knee pain no greater than 3/10 for improved comfort and functional ability Baseline: 10/10 at worst Goal status: INITIAL   3.  Pt will increase 30 Second Sit to Stand rep count to no less than 8 reps for improved balance, strength, and functional mobility Baseline: 4 reps with UE  Goal status: INITIAL   4.  Pt will improve bilateral LE MMT to no less than 4/5 for all tested motions with decreased pain for improved functional mobility and stability to bilateral knees Baseline: see MMT chart Goal status: INITIAL   PLAN:  PT FREQUENCY: 1-2x/week  PT DURATION: 8 weeks  PLANNED INTERVENTIONS: 97164- PT Re-evaluation, 97110-Therapeutic exercises, 97530- Therapeutic activity, [id number]- Neuromuscular re-education, 97535- Self Care, 16109- Manual therapy, [id number]- Gait training, [phone]- Aquatic Therapy, U9811- Electrical stimulation (unattended), [id number]- Electrical stimulation (manual), 97016- Vasopneumatic device, Cryotherapy, and Moist heat  PLAN FOR NEXT SESSION: assess HEP response, quad and hip strengthening in gym and pool    Ivor Mars, PT 06/25/2023, 1:15 PM

## 2023-06-26 ENCOUNTER — Encounter: Payer: Self-pay | Admitting: Physical Therapy

## 2023-06-26 ENCOUNTER — Ambulatory Visit: Admitting: Physical Therapy

## 2023-06-26 DIAGNOSIS — G8929 Other chronic pain: Secondary | ICD-10-CM

## 2023-06-26 DIAGNOSIS — M6281 Muscle weakness (generalized): Secondary | ICD-10-CM

## 2023-06-26 DIAGNOSIS — M546 Pain in thoracic spine: Secondary | ICD-10-CM

## 2023-06-26 DIAGNOSIS — R2689 Other abnormalities of gait and mobility: Secondary | ICD-10-CM

## 2023-06-26 DIAGNOSIS — M25561 Pain in right knee: Secondary | ICD-10-CM | POA: Diagnosis not present

## 2023-06-26 NOTE — Therapy (Signed)
 OUTPATIENT PHYSICAL THERAPY TREATMENT   Patient Name: Kristine Kelly MRN: [id number] DOB:03-May-1970, 53 y.o., female Today's Date: 06/26/2023  END OF SESSION:  PT End of Session - 06/26/23 1551     Visit Number 3    Number of Visits 17    Date for PT Re-Evaluation 08/13/23    Authorization Type U[employer]    PT Start Time 1545    PT Stop Time 1633    PT Time Calculation (min) 48 min    Activity Tolerance Patient tolerated treatment well    Behavior During Therapy WFL for tasks assessed/performed               Past Medical History:  Diagnosis Date   Anxiety    NO RECENT ANXIETY ATTACKS PER PT ON 10-04-2020   Arthritis    OA LEFT KNEE   Claustrophobia 10/04/2020   COVID-19 2020   COUGH SOB X 6 WEEKS ALL SYMPTOMS RESOLVED   GERD (gastroesophageal reflux disease)    Headache    Hypertension    not currently on medications   Wears glasses 10/04/2020   Past Surgical History:  Procedure Laterality Date   BREAST LUMPECTOMY Right 1991   BENIGN   CHOLECYSTECTOMY  2006   LAPAROSCOPIC   DILATATION AND CURETTAGE/HYSTEROSCOPY WITH MINERVA N/A 07/30/2018   Procedure: DILATATION AND CURETTAGE/HYSTEROSCOPY WITH MINERVA;  Surgeon: Ana Balling, MD;  Location: [facility];  Service: Gynecology;  Laterality: N/A;   TUBAL LIGATION     21 YRS AGO PER PT ON 10-04-2020   VAGINAL HYSTERECTOMY  10/11/2020   Procedure: TOTAL HYSTERECTOMY VAGINAL with RIGHT SALPINGECTOMY;  Surgeon: Othelia Blinks, MD;  Location: [facility];  Service: Gynecology;;   Patient Active Problem List   Diagnosis Date Noted   Bilateral chronic knee pain 04/29/2023   Osteoarthritis, multiple sites 11/12/2022   Pain in right hip 11/12/2022   Numbness and tingling 11/12/2022   Post-operative state 11/21/2020   H/O vaginal hysterectomy 11/21/2020   Vaginal discharge 10/05/2020   Menopausal symptoms 09/01/2020   Dermatophytosis 05/25/2020   Obesity 04/28/2018   Anxiety and depression  10/04/2016    PCP: Senaida Dama, NP  REFERRING PROVIDER: Persons, Norma Beckers, PA   REFERRING DIAG: M25.561,M25.562,G89.29 (ICD-10-CM) - Bilateral chronic knee pain   THERAPY DIAG:  Chronic pain of both knees  Muscle weakness (generalized)  Other abnormalities of gait and mobility  Pain in thoracic spine  Rationale for Evaluation and Treatment: Rehabilitation  ONSET DATE: Chronic  SUBJECTIVE:   SUBJECTIVE STATEMENT: Pt presents to aquatics with 5/10 in bil knees and Right hip today.  She reported that she was discouraged about her pain today.  EVAL: Pt is well known to therapist and presents to PT with reports of chronic bilateral knee and R hip pain. Also has pain in multiple bilateral joint sites, saw rheumatology last year but never heard back about results of labs. 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 Denies N/T or any paresthesias down LE.   PERTINENT HISTORY: HTN, see other PMH   PAIN:  Are you having pain?  Yes: NPRS scale: 7/10 Worst: 10/10 Pain location: bilateral knee Pain description: sharp, sore Aggravating factors: prolonged standing, squatting, stairs Relieving factors: rest, medication  PRECAUTIONS: None  RED FLAGS: None   WEIGHT BEARING RESTRICTIONS: No  FALLS:  Has patient fallen in last 6 months? Yes. Number of falls - 1  LIVING  ENVIRONMENT: Lives with: lives with their family Lives in: House/apartment Stairs: Yes: External: 13 steps; bilateral but cannot reach both Has following equipment at home: None  OCCUPATION: Product manager   PLOF: Independent  PATIENT GOALS: decrease knee pain, improve functional mobility with stairs and work  NEXT MD VISIT: 07/05/2023  OBJECTIVE:  Note: Objective measures were completed at Evaluation unless otherwise noted.  DIAGNOSTIC FINDINGS: See imaging  PATIENT SURVEYS:  LEFS: 27/80  COGNITION: Overall  cognitive status: Within functional limits for tasks assessed     SENSATION: WFL  POSTURE: rounded shoulders, forward head, and knee vaglus  PALPATION: TTP to bilateral patellar tendon, bilateral patellar hypomobility  LOWER EXTREMITY ROM:  Active ROM Right eval Left eval  Hip flexion    Hip extension    Hip abduction    Hip adduction    Hip internal rotation    Hip external rotation    Knee flexion 115 125  Knee extension 0 0  Ankle dorsiflexion    Ankle plantarflexion    Ankle inversion    Ankle eversion     (Blank rows = not tested)  LOWER EXTREMITY MMT:  MMT Right eval Left eval  Hip flexion 4 4  Hip extension    Hip abduction 3 3  Hip adduction    Hip internal rotation    Hip external rotation    Knee flexion 4 4  Knee extension 4 p! 4  Ankle dorsiflexion    Ankle plantarflexion    Ankle inversion    Ankle eversion     (Blank rows = not tested)  LOWER EXTREMITY SPECIAL TESTS:  Knee special tests: Posterior drawer test: negative and Lachman Test: negative  FUNCTIONAL TESTS:  30 Second Sit to Stand: 4 reps with UE  GAIT: Distance walked: 22ft Assistive device utilized: None Level of assistance: Complete Independence Comments: antalgic gait, decreased gait speed   TREATMENT: [facility] Adult PT Treatment:                                                DATE: 06-26-23 Aquatic therapy at [facility]- [address] - therapeutic pool temp approximately 92 degrees.Pt enters building independently. Treatment took place in water 3.8 to  4 ft 8 in. deep depending upon activity.  Pt entered and exited the pool via stair and handrails independently. Pt pain level 5/10 in bil knees and Right hip   Rosana was educated on beneficial therapeutic effects of water while ambulating to acclimate to water walking forward, backward and side stepping.  Pt educated on neutral posture and hip hinging in seated position with water at chest level x 10 with stretch to low back  and then x 10 with back at pool wall at external cue, VC for neck tucked to prevent hyperextension. Given area pools for continuing aquatic fitness post DC   Aquatic Exercise: On submerged bench and on edge of pool holding with one UE   Walking side stepping and high stepping across pool x 4 Heel/ toe raises BIL  Gastroc stretch with foot against pool wall  Hip ext/flex with knee straight  Hip abduction/adduction x10 BIL Hamstring curl Standing hip flexor stretch  Hip circles CW/CCW  pt fatigueing Stomp with yellow noodle  can only do left side Standing rows UE with yellow resistant bells Standing UE horizontal abduction with yellow bells Standing  UEabduction/adduction with yellow bells Squats x 20 SL squat Bicycle kicks x1' Alternating LAQs Flutter kicks x 1 '  Scissor kicks x 1' Aquastretch for bil knees over rectus , vastus lateralis and bil medial knees   Pt requires the buoyancy of water for active assisted exercises with buoyancy supported for strengthening and AROM exercises. Hydrostatic pressure also supports joints by unweighting joint load by at least 50 % in 3-4 feet depth water. 80% in chest to neck deep water. Water will provide assistance with movement using the current and laminar flow while the buoyancy reduces weight bearing. Pt requires the viscosity of the water for resistance endurance  with strengthening exercises. [facility] Adult PT Treatment:                                                DATE: 06/25/2023 NuStep lvl 3 UE/LE x 4 min for functional activity tolerance Supine QS x 10 - 5" hold Supine SLR 2x10each Hooklying clamshell 2x15 blue band Bridge - attempted increased pain LTR x 5 each Hooklying ball squeeze 2x10 - 5" hold SAQ 2x10 2#  [facility] Adult PT Treatment:                                                DATE: 06/18/2023 Therapeutic Exercise: Supine QS x 5 - 5" hold Supine SLR x 5 each S/L clamshell x 5 each RTB  PATIENT EDUCATION:  Education details:  eval findings, LEFS, HEP, POC Person educated: Patient Education method: Explanation, Demonstration, and Handouts Education comprehension: verbalized understanding and returned demonstration  HOME EXERCISE PROGRAM: Access Code: [email or web address] URL: [email or web address] Date: 06/18/2023 Prepared by: Loral Roch  Exercises - Supine Quadricep Sets  - 1 x daily - 7 x weekly - 2 sets - 10 reps - 5 sec hold - Active Straight Leg Raise with Quad Set  - 1 x daily - 7 x weekly - 2-3 sets - 5 reps - Clamshell with Resistance  - 1 x daily - 7 x weekly - 3 sets - 10 reps - red band hold  ASSESSMENT:  CLINICAL IMPRESSION: Diavian presents to first aquatic PT session reporting 5/10 pain  in bil knees and Right hip. Pt comments that she has decreased pain in the water. Pt expressed discouragement about pain level and left verbalizing understanding of prioritizing her health and self-care.  Session today focused on establishing aquatic HEP, general strengthening  tasks in the aquatic environment for use of buoyancy to offload joints and the viscosity of water as resistance during therapeutic exercise. Patient was able to tolerate all prescribed exercises in the aquatic environment with no adverse effects.  Patient continues to benefit from skilled PT services on land and aquatic based and should be progressed as able to improve functional independence  EVAL: Patient is a 53 y.o. F who was seen today for physical therapy evaluation and treatment for chronic bilateral knee pain with occasional R hip pain as well. Physical findings are consistent with referring provider impression as pt demonstrates decrease in quad and proximal hip strength and general functional mobility. LEFS score demonstrates severe disability in performance of home ADLs and higher level community activities. Pt would benefit from skilled PT services working on improving quad  and hip strength in order to decrease knee pain.    OBJECTIVE IMPAIRMENTS: Abnormal gait, decreased activity tolerance, decreased endurance, decreased mobility, difficulty walking, decreased strength, and pain   ACTIVITY LIMITATIONS: carrying, lifting, sitting, standing, squatting, stairs, transfers, and locomotion level  PARTICIPATION LIMITATIONS: meal prep, cleaning, driving, shopping, community activity, occupation, and yard work  PERSONAL FACTORS: Time since onset of injury/illness/exacerbation and 1-2 comorbidities: HTN, see other PMH  are also affecting patient's functional outcome.   REHAB POTENTIAL: Good  CLINICAL DECISION MAKING: Stable/uncomplicated  EVALUATION COMPLEXITY: Low   GOALS: Goals reviewed with patient? No  SHORT TERM GOALS: Target date: 07/09/2023   Pt will be compliant and knowledgeable with initial HEP for improved comfort and carryover Baseline: initial HEP given  Goal status: INITIAL  2.  Pt will self report bilateral knee pain no greater than 6/10 for improved comfort and functional ability Baseline: 10/10 at worst Goal status: INITIAL   LONG TERM GOALS: Target date: 08/13/2023   Pt will improve LEFS to no less than 40/80 as proxy for functional improvement with home ADLs and higher level community activity Baseline: 27/80 Goal status: INITIAL   2.  Pt will self report bilateral knee pain no greater than 3/10 for improved comfort and functional ability Baseline: 10/10 at worst Goal status: INITIAL   3.  Pt will increase 30 Second Sit to Stand rep count to no less than 8 reps for improved balance, strength, and functional mobility Baseline: 4 reps with UE  Goal status: INITIAL   4.  Pt will improve bilateral LE MMT to no less than 4/5 for all tested motions with decreased pain for improved functional mobility and stability to bilateral knees Baseline: see MMT chart Goal status: INITIAL   PLAN:  PT FREQUENCY: 1-2x/week  PT DURATION: 8 weeks  PLANNED INTERVENTIONS: 97164- PT Re-evaluation,  97110-Therapeutic exercises, 97530- Therapeutic activity, 97112- Neuromuscular re-education, 97535- Self Care, 16109- Manual therapy, [id number]- Gait training, [phone]- Aquatic Therapy, [id number]- Electrical stimulation (unattended), [id number]- Electrical stimulation (manual), 97016- Vasopneumatic device, Cryotherapy, and Moist heat  PLAN FOR NEXT SESSION: assess HEP response, quad and hip strengthening in gym and pool   Sharlet Dawson, PT, [facility] Certified Exercise Expert for the Aging Adult  06/26/23 4:38 PM Phone: [phone] Fax: [phone]

## 2023-07-02 ENCOUNTER — Ambulatory Visit

## 2023-07-02 DIAGNOSIS — R2689 Other abnormalities of gait and mobility: Secondary | ICD-10-CM

## 2023-07-02 DIAGNOSIS — M6281 Muscle weakness (generalized): Secondary | ICD-10-CM

## 2023-07-02 DIAGNOSIS — M25561 Pain in right knee: Secondary | ICD-10-CM | POA: Diagnosis not present

## 2023-07-02 DIAGNOSIS — G8929 Other chronic pain: Secondary | ICD-10-CM

## 2023-07-02 NOTE — Therapy (Signed)
 OUTPATIENT PHYSICAL THERAPY TREATMENT   Patient Name: Kristine Kelly MRN: [id number] DOB:25-Oct-1970, 53 y.o., female Today's Date: 07/02/2023  END OF SESSION:  PT End of Session - 07/02/23 0843     Visit Number 4    Number of Visits 17    Date for PT Re-Evaluation 08/13/23    Authorization Type [employer]    PT Start Time 0845    PT Stop Time 0925    PT Time Calculation (min) 40 min    Activity Tolerance Patient tolerated treatment well    Behavior During Therapy [employer] for tasks assessed/performed                Past Medical History:  Diagnosis Date   Anxiety    NO RECENT ANXIETY ATTACKS PER PT ON 10-04-2020   Arthritis    OA LEFT KNEE   Claustrophobia 10/04/2020   COVID-19 2020   COUGH SOB X 6 WEEKS ALL SYMPTOMS RESOLVED   GERD (gastroesophageal reflux disease)    Headache    Hypertension    not currently on medications   Wears glasses 10/04/2020   Past Surgical History:  Procedure Laterality Date   BREAST LUMPECTOMY Right 1991   BENIGN   CHOLECYSTECTOMY  2006   LAPAROSCOPIC   DILATATION AND CURETTAGE/HYSTEROSCOPY WITH MINERVA N/A 07/30/2018   Procedure: DILATATION AND CURETTAGE/HYSTEROSCOPY WITH MINERVA;  Surgeon: Ana Balling, MD;  Location: [facility];  Service: Gynecology;  Laterality: N/A;   TUBAL LIGATION     21 YRS AGO PER PT ON 10-04-2020   VAGINAL HYSTERECTOMY  10/11/2020   Procedure: TOTAL HYSTERECTOMY VAGINAL with RIGHT SALPINGECTOMY;  Surgeon: Othelia Blinks, MD;  Location: [facility];  Service: Gynecology;;   Patient Active Problem List   Diagnosis Date Noted   Bilateral chronic knee pain 04/29/2023   Osteoarthritis, multiple sites 11/12/2022   Pain in right hip 11/12/2022   Numbness and tingling 11/12/2022   Post-operative state 11/21/2020   H/O vaginal hysterectomy 11/21/2020   Vaginal discharge 10/05/2020   Menopausal symptoms 09/01/2020   Dermatophytosis 05/25/2020   Obesity 04/28/2018   Anxiety and depression  10/04/2016    PCP: Senaida Dama, NP  REFERRING PROVIDER: Persons, Norma Beckers, PA   REFERRING DIAG: M25.561,M25.562,G89.29 (ICD-10-CM) - Bilateral chronic knee pain   THERAPY DIAG:  Chronic pain of both knees  Muscle weakness (generalized)  Other abnormalities of gait and mobility  Rationale for Evaluation and Treatment: Rehabilitation  ONSET DATE: Chronic  SUBJECTIVE:   SUBJECTIVE STATEMENT: Pt presents to PT continued reports of continued pain although less than previous session. Had good initial pool session last week and believed it helped.   EVAL: Pt is well known to therapist and presents to PT with reports of chronic bilateral knee and R hip pain. Also has pain in multiple bilateral joint sites, saw rheumatology last year but never heard back about results of labs. 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 Denies N/T or any paresthesias down LE.   PERTINENT HISTORY: HTN, see other PMH   PAIN:  Are you having pain?  Yes: NPRS scale: 7/10 Worst: 10/10 Pain location: bilateral knee Pain description: sharp, sore Aggravating factors: prolonged standing, squatting, stairs Relieving factors: rest, medication  PRECAUTIONS: None  RED FLAGS: None   WEIGHT BEARING RESTRICTIONS: No  FALLS:  Has patient fallen in last 6 months? Yes. Number of falls - 1  LIVING ENVIRONMENT: Lives  with: lives with their family Lives in: House/apartment Stairs: Yes: External: 13 steps; bilateral but cannot reach both Has following equipment at home: None  OCCUPATION: Product manager   PLOF: Independent  PATIENT GOALS: decrease knee pain, improve functional mobility with stairs and work  NEXT MD VISIT: 07/05/2023  OBJECTIVE:  Note: Objective measures were completed at Evaluation unless otherwise noted.  DIAGNOSTIC FINDINGS: See imaging  PATIENT SURVEYS:  LEFS: 27/80  COGNITION: Overall  cognitive status: Within functional limits for tasks assessed     SENSATION: WFL  POSTURE: rounded shoulders, forward head, and knee vaglus  PALPATION: TTP to bilateral patellar tendon, bilateral patellar hypomobility  LOWER EXTREMITY ROM:  Active ROM Right eval Left eval  Hip flexion    Hip extension    Hip abduction    Hip adduction    Hip internal rotation    Hip external rotation    Knee flexion 115 125  Knee extension 0 0  Ankle dorsiflexion    Ankle plantarflexion    Ankle inversion    Ankle eversion     (Blank rows = not tested)  LOWER EXTREMITY MMT:  MMT Right eval Left eval  Hip flexion 4 4  Hip extension    Hip abduction 3 3  Hip adduction    Hip internal rotation    Hip external rotation    Knee flexion 4 4  Knee extension 4 p! 4  Ankle dorsiflexion    Ankle plantarflexion    Ankle inversion    Ankle eversion     (Blank rows = not tested)  LOWER EXTREMITY SPECIAL TESTS:  Knee special tests: Posterior drawer test: negative and Lachman Test: negative  FUNCTIONAL TESTS:  30 Second Sit to Stand: 4 reps with UE  GAIT: Distance walked: 51ft Assistive device utilized: None Level of assistance: Complete Independence Comments: antalgic gait, decreased gait speed   TREATMENT: [facility] Adult PT Treatment:                                                DATE: 07/02/2023 NuStep lvl 4 UE/LE x 3 min for functional activity tolerance Supine QS x 10 - 5" hold Supine SLR 2x5 ea w/ QS Hooklying clamshell 2x15 blue band Hooklying march 2x20 blue band SAQ 2x10 2.5# LAQ 2x10 w/ QS Seated ball squeeze x 10 - 5" hold FAQ with ball squeeze 2x10 Standing TKE with ball 2x10 - 5" hold ea Standing hip abd x 10 YTB  [facility] Adult PT Treatment:                                                DATE: 06-26-23 Aquatic therapy at [facility]- [address] - therapeutic pool temp approximately 92 degrees.Pt enters building independently. Treatment took place in water 3.8 to   4 ft 8 in. deep depending upon activity.  Pt entered and exited the pool via stair and handrails independently. Pt pain level 5/10 in bil knees and Right hip   Evone was educated on beneficial therapeutic effects of water while ambulating to acclimate to water walking forward, backward and side stepping.  Pt educated on neutral posture and hip hinging in seated position with water at chest level x 10 with  stretch to low back and then x 10 with back at pool wall at external cue, VC for neck tucked to prevent hyperextension. Given area pools for continuing aquatic fitness post DC   Aquatic Exercise: On submerged bench and on edge of pool holding with one UE   Walking side stepping and high stepping across pool x 4 Heel/ toe raises BIL  Gastroc stretch with foot against pool wall  Hip ext/flex with knee straight  Hip abduction/adduction x10 BIL Hamstring curl Standing hip flexor stretch  Hip circles CW/CCW  pt fatigueing Stomp with yellow noodle  can only do left side Standing rows UE with yellow resistant bells Standing UE horizontal abduction with yellow bells Standing  UEabduction/adduction with yellow bells Squats x 20 SL squat Bicycle kicks x1' Alternating LAQs Flutter kicks x 1 '  Scissor kicks x 1' Aquastretch for bil knees over rectus , vastus lateralis and bil medial knees   Pt requires the buoyancy of water for active assisted exercises with buoyancy supported for strengthening and AROM exercises. Hydrostatic pressure also supports joints by unweighting joint load by at least 50 % in 3-4 feet depth water. 80% in chest to neck deep water. Water will provide assistance with movement using the current and laminar flow while the buoyancy reduces weight bearing. Pt requires the viscosity of the water for resistance endurance  with strengthening exercises.  [facility] Adult PT Treatment:                                                DATE: 06/25/2023 NuStep lvl 3 UE/LE x 4 min for  functional activity tolerance Supine QS x 10 - 5" hold Supine SLR 2x10each Hooklying clamshell 2x15 blue band Bridge - attempted increased pain LTR x 5 each Hooklying ball squeeze 2x10 - 5" hold SAQ 2x10 2#  PATIENT EDUCATION:  Education details: continue HEP Person educated: Patient Education method: Explanation, Demonstration, and Handouts Education comprehension: verbalized understanding and returned demonstration  HOME EXERCISE PROGRAM: Access Code: [email or web address] URL: [email or web address] Date: 07/02/2023 Prepared by: Loral Roch  Exercises - Supine Quadricep Sets  - 1 x daily - 7 x weekly - 2 sets - 10 reps - 5 sec hold - Active Straight Leg Raise with Quad Set  - 1 x daily - 7 x weekly - 2-3 sets - 5 reps - Clamshell with Resistance  - 1 x daily - 7 x weekly - 3 sets - 10 reps - red band hold - Seated Long Arc Quad  - 1 x daily - 7 x weekly - 3 sets - 10 reps - with quad squeeze hold - Seated Hip Adduction Isometrics with Ball  - 1 x daily - 7 x weekly - 2 sets - 10 reps - 5 sec hold  ASSESSMENT:  CLINICAL IMPRESSION: Pt was able to complete all prescribed exercises but continue to have LE pain. HEP was updated for continued quad and proximal hip strengthening. She continues to demo LE weakness and fatigue with strengthening exercises. PT will continue to progress exercises as able per POC.   EVAL: Patient is a 53 y.o. F who was seen today for physical therapy evaluation and treatment for chronic bilateral knee pain with occasional R hip pain as well. Physical findings are consistent with referring provider impression as pt demonstrates decrease in quad and proximal hip strength and general functional  mobility. LEFS score demonstrates severe disability in performance of home ADLs and higher level community activities. Pt would benefit from skilled PT services working on improving quad and hip strength in order to decrease knee pain.   OBJECTIVE IMPAIRMENTS:  Abnormal gait, decreased activity tolerance, decreased endurance, decreased mobility, difficulty walking, decreased strength, and pain   ACTIVITY LIMITATIONS: carrying, lifting, sitting, standing, squatting, stairs, transfers, and locomotion level  PARTICIPATION LIMITATIONS: meal prep, cleaning, driving, shopping, community activity, occupation, and yard work  PERSONAL FACTORS: Time since onset of injury/illness/exacerbation and 1-2 comorbidities: HTN, see other PMH  are also affecting patient's functional outcome.   REHAB POTENTIAL: Good  CLINICAL DECISION MAKING: Stable/uncomplicated  EVALUATION COMPLEXITY: Low   GOALS: Goals reviewed with patient? No  SHORT TERM GOALS: Target date: 07/09/2023   Pt will be compliant and knowledgeable with initial HEP for improved comfort and carryover Baseline: initial HEP given  Goal status: INITIAL  2.  Pt will self report bilateral knee pain no greater than 6/10 for improved comfort and functional ability Baseline: 10/10 at worst Goal status: INITIAL   LONG TERM GOALS: Target date: 08/13/2023   Pt will improve LEFS to no less than 40/80 as proxy for functional improvement with home ADLs and higher level community activity Baseline: 27/80 Goal status: INITIAL   2.  Pt will self report bilateral knee pain no greater than 3/10 for improved comfort and functional ability Baseline: 10/10 at worst Goal status: INITIAL   3.  Pt will increase 30 Second Sit to Stand rep count to no less than 8 reps for improved balance, strength, and functional mobility Baseline: 4 reps with UE  Goal status: INITIAL   4.  Pt will improve bilateral LE MMT to no less than 4/5 for all tested motions with decreased pain for improved functional mobility and stability to bilateral knees Baseline: see MMT chart Goal status: INITIAL   PLAN:  PT FREQUENCY: 1-2x/week  PT DURATION: 8 weeks  PLANNED INTERVENTIONS: 97164- PT Re-evaluation, 97110-Therapeutic  exercises, 97530- Therapeutic activity, [id number]- Neuromuscular re-education, 97535- Self Care, 40102- Manual therapy, [id number]- Gait training, [phone]- Aquatic Therapy, [id number]- Electrical stimulation (unattended), [id number]- Electrical stimulation (manual), 97016- Vasopneumatic device, Cryotherapy, and Moist heat  PLAN FOR NEXT SESSION: assess HEP response, quad and hip strengthening in gym and pool   Ivor Mars PT  07/02/23 9:30 AM

## 2023-07-03 ENCOUNTER — Ambulatory Visit: Admitting: Physical Therapy

## 2023-07-03 ENCOUNTER — Encounter: Payer: Self-pay | Admitting: Physical Therapy

## 2023-07-03 ENCOUNTER — Ambulatory Visit

## 2023-07-03 DIAGNOSIS — R2689 Other abnormalities of gait and mobility: Secondary | ICD-10-CM

## 2023-07-03 DIAGNOSIS — M6281 Muscle weakness (generalized): Secondary | ICD-10-CM

## 2023-07-03 DIAGNOSIS — M546 Pain in thoracic spine: Secondary | ICD-10-CM

## 2023-07-03 DIAGNOSIS — G8929 Other chronic pain: Secondary | ICD-10-CM

## 2023-07-03 DIAGNOSIS — M25561 Pain in right knee: Secondary | ICD-10-CM | POA: Diagnosis not present

## 2023-07-03 NOTE — Therapy (Signed)
 OUTPATIENT PHYSICAL THERAPY TREATMENT   Patient Name: Kristine Kelly MRN: [id number] DOB:07-10-1970, 53 y.o., female Today's Date: 07/03/2023  END OF SESSION:  PT End of Session - 07/03/23 1457     Visit Number 5    Number of Visits 17    Date for PT Re-Evaluation 08/13/23    Authorization Type [employer]    PT Start Time 1500    PT Stop Time 1545    PT Time Calculation (min) 45 min    Activity Tolerance Patient tolerated treatment well    Behavior During Therapy WFL for tasks assessed/performed                 Past Medical History:  Diagnosis Date   Anxiety    NO RECENT ANXIETY ATTACKS PER PT ON 10-04-2020   Arthritis    OA LEFT KNEE   Claustrophobia 10/04/2020   COVID-19 2020   COUGH SOB X 6 WEEKS ALL SYMPTOMS RESOLVED   GERD (gastroesophageal reflux disease)    Headache    Hypertension    not currently on medications   Wears glasses 10/04/2020   Past Surgical History:  Procedure Laterality Date   BREAST LUMPECTOMY Right 1991   BENIGN   CHOLECYSTECTOMY  2006   LAPAROSCOPIC   DILATATION AND CURETTAGE/HYSTEROSCOPY WITH MINERVA N/A 07/30/2018   Procedure: DILATATION AND CURETTAGE/HYSTEROSCOPY WITH MINERVA;  Surgeon: Ana Balling, MD;  Location: [facility];  Service: Gynecology;  Laterality: N/A;   TUBAL LIGATION     21 YRS AGO PER PT ON 10-04-2020   VAGINAL HYSTERECTOMY  10/11/2020   Procedure: TOTAL HYSTERECTOMY VAGINAL with RIGHT SALPINGECTOMY;  Surgeon: Othelia Blinks, MD;  Location: [facility];  Service: Gynecology;;   Patient Active Problem List   Diagnosis Date Noted   Bilateral chronic knee pain 04/29/2023   Osteoarthritis, multiple sites 11/12/2022   Pain in right hip 11/12/2022   Numbness and tingling 11/12/2022   Post-operative state 11/21/2020   H/O vaginal hysterectomy 11/21/2020   Vaginal discharge 10/05/2020   Menopausal symptoms 09/01/2020   Dermatophytosis 05/25/2020   Obesity 04/28/2018   Anxiety and  depression 10/04/2016    PCP: Senaida Dama, NP  REFERRING PROVIDER: Persons, Norma Beckers, PA   REFERRING DIAG: M25.561,M25.562,G89.29 (ICD-10-CM) - Bilateral chronic knee pain   THERAPY DIAG:  Chronic pain of both knees  Muscle weakness (generalized)  Other abnormalities of gait and mobility  Pain in thoracic spine  Rationale for Evaluation and Treatment: Rehabilitation  ONSET DATE: Chronic  SUBJECTIVE:   SUBJECTIVE STATEMENT: Pt presents to aquatic PT  with 7/10 pain in bil knees. I was able to sleep well that night. I really worked hard yesterday in clinic.  I will barely be able to walk up my steps.  EVAL: Pt is well known to therapist and presents to PT with reports of chronic bilateral knee and R hip pain. Also has pain in multiple bilateral joint sites, saw rheumatology last year but never heard back about results of labs. 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 Denies N/T or any paresthesias down LE.   PERTINENT HISTORY: HTN, see other PMH   PAIN:  Are you having pain?  Yes: NPRS scale: 7/10 Worst: 10/10 Pain location: bilateral knee Pain description: sharp, sore Aggravating factors: prolonged standing, squatting, stairs Relieving factors: rest, medication  PRECAUTIONS: None  RED FLAGS: None   WEIGHT BEARING RESTRICTIONS: No  FALLS:  Has patient fallen in last 6 months? Yes. Number of falls - 1  LIVING ENVIRONMENT: Lives with: lives with their family Lives in: House/apartment Stairs: Yes: External: 13 steps; bilateral but cannot reach both Has following equipment at home: None  OCCUPATION: Product manager   PLOF: Independent  PATIENT GOALS: decrease knee pain, improve functional mobility with stairs and work  NEXT MD VISIT: 07/05/2023  OBJECTIVE:  Note: Objective measures were completed at Evaluation unless otherwise noted.  DIAGNOSTIC FINDINGS: See  imaging  PATIENT SURVEYS:  LEFS: 27/80  COGNITION: Overall cognitive status: Within functional limits for tasks assessed     SENSATION: WFL  POSTURE: rounded shoulders, forward head, and knee vaglus  PALPATION: TTP to bilateral patellar tendon, bilateral patellar hypomobility  LOWER EXTREMITY ROM:  Active ROM Right eval Left eval  Hip flexion    Hip extension    Hip abduction    Hip adduction    Hip internal rotation    Hip external rotation    Knee flexion 115 125  Knee extension 0 0  Ankle dorsiflexion    Ankle plantarflexion    Ankle inversion    Ankle eversion     (Blank rows = not tested)  LOWER EXTREMITY MMT:  MMT Right eval Left eval  Hip flexion 4 4  Hip extension    Hip abduction 3 3  Hip adduction    Hip internal rotation    Hip external rotation    Knee flexion 4 4  Knee extension 4 p! 4  Ankle dorsiflexion    Ankle plantarflexion    Ankle inversion    Ankle eversion     (Blank rows = not tested)  LOWER EXTREMITY SPECIAL TESTS:  Knee special tests: Posterior drawer test: negative and Lachman Test: negative  FUNCTIONAL TESTS:  30 Second Sit to Stand: 4 reps with UE  GAIT: Distance walked: 46ft Assistive device utilized: None Level of assistance: Complete Independence Comments: antalgic gait, decreased gait speed   TREATMENT: [facility] Adult PT Treatment:                                                DATE: 07-03-23 Aquatic therapy at [facility]- [address] - therapeutic pool temp approximately 90 degrees.Pt enters building independently. Treatment took place in water 3.8 to  4 ft 8 in. deep depending upon activity.  Pt entered and exited the pool via stair and handrails independently. Pt pain level 7/10 in bil knees and Right hip   Aquatic Exercise: On submerged bench and on edge of pool holding with one UE   Aquastretch for bil knees over rectus , vastus lateralis and bil medial knees Walking side stepping and high stepping  across pool x 4 Standing UE horizontal abduction with green bells walking across pool Standing  UEabduction/adduction with green bells walking across pool Standing rows UE with green resistant bells walking across pool Heel/ toe raises BIL  Gastroc stretch with foot against pool wall  Hip ext/flex with knee straight  Hip abduction/adduction x10 BIL Hamstring curl 3 way hip stretch with yellow noodle Hip circles CW/CCW  pt fatigueing Stomp with yellow noodle  can only do left side Squats x 20 SL squat Runners stretch bottom step x30 BIL Hamstring stretch bottom step x30 BIL Back L stretch    Pt requires the buoyancy of water for active  assisted exercises with buoyancy supported for strengthening and AROM exercises. Hydrostatic pressure also supports joints by unweighting joint load by at least 50 % in 3-4 feet depth water. 80% in chest to neck deep water. Water will provide assistance with movement using the current and laminar flow while the buoyancy reduces weight bearing. Pt requires the viscosity of the water for resistance endurance  with strengthening exercises.    [facility] Adult PT Treatment:                                                DATE: 07/02/2023 NuStep lvl 4 UE/LE x 3 min for functional activity tolerance Supine QS x 10 - 5 hold Supine SLR 2x5 ea w/ QS Hooklying clamshell 2x15 blue band Hooklying march 2x20 blue band SAQ 2x10 2.5# LAQ 2x10 w/ QS Seated ball squeeze x 10 - 5 hold FAQ with ball squeeze 2x10 Standing TKE with ball 2x10 - 5 hold ea Standing hip abd x 10 YTB  [facility] Adult PT Treatment:                                                DATE: 06-26-23 Aquatic therapy at [facility]- [address] - therapeutic pool temp approximately 92 degrees.Pt enters building independently. Treatment took place in water 3.8 to  4 ft 8 in. deep depending upon activity.  Pt entered and exited the pool via stair and handrails independently. Pt pain level 5/10 in bil knees  and Right hip   Adan was educated on beneficial therapeutic effects of water while ambulating to acclimate to water walking forward, backward and side stepping.  Pt educated on neutral posture and hip hinging in seated position with water at chest level x 10 with stretch to low back and then x 10 with back at pool wall at external cue, VC for neck tucked to prevent hyperextension. Given area pools for continuing aquatic fitness post DC   Aquatic Exercise: On submerged bench and on edge of pool holding with one UE   Walking side stepping and high stepping across pool x 4 Heel/ toe raises BIL  Gastroc stretch with foot against pool wall  Hip ext/flex with knee straight  Hip abduction/adduction x10 BIL Hamstring curl Standing hip flexor stretch  Hip circles CW/CCW  pt fatigueing Stomp with yellow noodle  can only do left side Standing rows UE with yellow resistant bells Standing UE horizontal abduction with yellow bells Standing  UEabduction/adduction with yellow bells Squats x 20 SL squat Bicycle kicks x1' Alternating LAQs Flutter kicks x 1 '  Scissor kicks x 1' Aquastretch for bil knees over rectus , vastus lateralis and bil medial knees   Pt requires the buoyancy of water for active assisted exercises with buoyancy supported for strengthening and AROM exercises. Hydrostatic pressure also supports joints by unweighting joint load by at least 50 % in 3-4 feet depth water. 80% in chest to neck deep water. Water will provide assistance with movement using the current and laminar flow while the buoyancy reduces weight bearing. Pt requires the viscosity of the water for resistance endurance  with strengthening exercises.  [facility] Adult PT Treatment:  DATE: 06/25/2023 NuStep lvl 3 UE/LE x 4 min for functional activity tolerance Supine QS x 10 - 5 hold Supine SLR 2x10each Hooklying clamshell 2x15 blue band Bridge - attempted increased pain LTR  x 5 each Hooklying ball squeeze 2x10 - 5 hold SAQ 2x10 2#  PATIENT EDUCATION:  Education details: continue HEP Person educated: Patient Education method: Explanation, Demonstration, and Handouts Education comprehension: verbalized understanding and returned demonstration  HOME EXERCISE PROGRAM: Access Code: [email or web address] URL: [email or web address] Date: 07/02/2023 Prepared by: Loral Roch  Exercises - Supine Quadricep Sets  - 1 x daily - 7 x weekly - 2 sets - 10 reps - 5 sec hold - Active Straight Leg Raise with Quad Set  - 1 x daily - 7 x weekly - 2-3 sets - 5 reps - Clamshell with Resistance  - 1 x daily - 7 x weekly - 3 sets - 10 reps - red band hold - Seated Long Arc Quad  - 1 x daily - 7 x weekly - 3 sets - 10 reps - with quad squeeze hold - Seated Hip Adduction Isometrics with Ball  - 1 x daily - 7 x weekly - 2 sets - 10 reps - 5 sec hold  ASSESSMENT:  CLINICAL IMPRESSION: Berenice presents to second aquatic PT session reporting 7/10 pain  in bil knees and Right hip especially after clinic visit yesterday. Haile participated at a slower pace and concentrated on deep breathing with exercise today. She comments that she has decreased pain in the water.   Session today focused on establishing aquatic HEP, general strengthening  tasks in the aquatic environment for use of buoyancy to offload joints and the viscosity of water as resistance during therapeutic exercise. Patient was able to tolerate all prescribed exercises in the aquatic environment with no adverse effects.  Patient continues to benefit from skilled PT services on land and aquatic based and should be progressed as able to improve functional independence    Pt was able to complete all prescribed exercises but continue to have LE pain. HEP was updated for continued quad and proximal hip strengthening. She continues to demo LE weakness and fatigue with strengthening exercises. PT will continue to progress exercises  as able per POC.   EVAL: Patient is a 53 y.o. F who was seen today for physical therapy evaluation and treatment for chronic bilateral knee pain with occasional R hip pain as well. Physical findings are consistent with referring provider impression as pt demonstrates decrease in quad and proximal hip strength and general functional mobility. LEFS score demonstrates severe disability in performance of home ADLs and higher level community activities. Pt would benefit from skilled PT services working on improving quad and hip strength in order to decrease knee pain.   OBJECTIVE IMPAIRMENTS: Abnormal gait, decreased activity tolerance, decreased endurance, decreased mobility, difficulty walking, decreased strength, and pain   ACTIVITY LIMITATIONS: carrying, lifting, sitting, standing, squatting, stairs, transfers, and locomotion level  PARTICIPATION LIMITATIONS: meal prep, cleaning, driving, shopping, community activity, occupation, and yard work  PERSONAL FACTORS: Time since onset of injury/illness/exacerbation and 1-2 comorbidities: HTN, see other PMH  are also affecting patient's functional outcome.   REHAB POTENTIAL: Good  CLINICAL DECISION MAKING: Stable/uncomplicated  EVALUATION COMPLEXITY: Low   GOALS: Goals reviewed with patient? No  SHORT TERM GOALS: Target date: 07/09/2023   Pt will be compliant and knowledgeable with initial HEP for improved comfort and carryover Baseline: initial HEP given  Goal status: INITIAL  2.  Pt will self report  bilateral knee pain no greater than 6/10 for improved comfort and functional ability Baseline: 10/10 at worst Goal status: INITIAL   LONG TERM GOALS: Target date: 08/13/2023   Pt will improve LEFS to no less than 40/80 as proxy for functional improvement with home ADLs and higher level community activity Baseline: 27/80 Goal status: INITIAL   2.  Pt will self report bilateral knee pain no greater than 3/10 for improved comfort and functional  ability Baseline: 10/10 at worst Goal status: INITIAL   3.  Pt will increase 30 Second Sit to Stand rep count to no less than 8 reps for improved balance, strength, and functional mobility Baseline: 4 reps with UE  Goal status: INITIAL   4.  Pt will improve bilateral LE MMT to no less than 4/5 for all tested motions with decreased pain for improved functional mobility and stability to bilateral knees Baseline: see MMT chart Goal status: INITIAL   PLAN:  PT FREQUENCY: 1-2x/week  PT DURATION: 8 weeks  PLANNED INTERVENTIONS: 97164- PT Re-evaluation, 97110-Therapeutic exercises, 97530- Therapeutic activity, 97112- Neuromuscular re-education, 97535- Self Care, 84132- Manual therapy, [id number]- Gait training, [id number]- Aquatic Therapy, [phone]- Electrical stimulation (unattended), [id number]- Electrical stimulation (manual), 97016- Vasopneumatic device, Cryotherapy, and Moist heat  PLAN FOR NEXT SESSION: assess HEP response, quad and hip strengthening in gym and pool   Sharlet Dawson, PT, [facility] Certified Exercise Expert for the Aging Adult  07/03/23 4:51 PM Phone: [phone] Fax: [phone]

## 2023-07-05 ENCOUNTER — Ambulatory Visit: Admitting: Physician Assistant

## 2023-07-08 ENCOUNTER — Ambulatory Visit

## 2023-07-08 ENCOUNTER — Telehealth: Payer: Self-pay | Admitting: Family

## 2023-07-08 DIAGNOSIS — G8929 Other chronic pain: Secondary | ICD-10-CM

## 2023-07-08 DIAGNOSIS — M25561 Pain in right knee: Secondary | ICD-10-CM | POA: Diagnosis not present

## 2023-07-08 DIAGNOSIS — M6281 Muscle weakness (generalized): Secondary | ICD-10-CM

## 2023-07-08 DIAGNOSIS — R2689 Other abnormalities of gait and mobility: Secondary | ICD-10-CM

## 2023-07-08 NOTE — Telephone Encounter (Signed)
 Placed in providers box for review patients last one was done on 07/09/2022 by provider

## 2023-07-08 NOTE — Telephone Encounter (Signed)
 Copied from CRM [id number]. Topic: General - Other >> Jul 08, 2023 12:46 PM [place] wrote: Reason for CRM: Need to renew handicap sticker- [phone]

## 2023-07-08 NOTE — Therapy (Signed)
 OUTPATIENT PHYSICAL THERAPY TREATMENT   Patient Name: Kristine Kelly MRN: [id number] DOB:Aug 23, 1970, 53 y.o., female Today's Date: 07/08/2023  END OF SESSION:  PT End of Session - 07/08/23 1143     Visit Number 6    Number of Visits 17    Date for PT Re-Evaluation 08/13/23    Authorization Type UHC    PT Start Time 1143    PT Stop Time 1223    PT Time Calculation (min) 40 min    Activity Tolerance Patient tolerated treatment well    Behavior During Therapy [facility] for tasks assessed/performed               Past Medical History:  Diagnosis Date   Anxiety    NO RECENT ANXIETY ATTACKS PER PT ON 10-04-2020   Arthritis    OA LEFT KNEE   Claustrophobia 10/04/2020   COVID-19 2020   COUGH SOB X 6 WEEKS ALL SYMPTOMS RESOLVED   GERD (gastroesophageal reflux disease)    Headache    Hypertension    not currently on medications   Wears glasses 10/04/2020   Past Surgical History:  Procedure Laterality Date   BREAST LUMPECTOMY Right 1991   BENIGN   CHOLECYSTECTOMY  2006   LAPAROSCOPIC   DILATATION AND CURETTAGE/HYSTEROSCOPY WITH MINERVA N/A 07/30/2018   Procedure: DILATATION AND CURETTAGE/HYSTEROSCOPY WITH MINERVA;  Surgeon: Ana Balling, MD;  Location: [facility];  Service: Gynecology;  Laterality: N/A;   TUBAL LIGATION     21 YRS AGO PER PT ON 10-04-2020   VAGINAL HYSTERECTOMY  10/11/2020   Procedure: TOTAL HYSTERECTOMY VAGINAL with RIGHT SALPINGECTOMY;  Surgeon: Othelia Blinks, MD;  Location: [facility];  Service: Gynecology;;   Patient Active Problem List   Diagnosis Date Noted   Bilateral chronic knee pain 04/29/2023   Osteoarthritis, multiple sites 11/12/2022   Pain in right hip 11/12/2022   Numbness and tingling 11/12/2022   Post-operative state 11/21/2020   H/O vaginal hysterectomy 11/21/2020   Vaginal discharge 10/05/2020   Menopausal symptoms 09/01/2020   Dermatophytosis 05/25/2020   Obesity 04/28/2018   Anxiety and depression  10/04/2016    PCP: Senaida Dama, NP  REFERRING PROVIDER: Persons, Norma Beckers, PA   REFERRING DIAG: M25.561,M25.562,G89.29 (ICD-10-CM) - Bilateral chronic knee pain   THERAPY DIAG:  Chronic pain of both knees  Muscle weakness (generalized)  Other abnormalities of gait and mobility  Rationale for Evaluation and Treatment: Rehabilitation  ONSET DATE: Chronic  SUBJECTIVE:   SUBJECTIVE STATEMENT: Pt presents to PT with continued knee pain at 4/10. Has continued HEP compliance.   EVAL: Pt is well known to therapist and presents to PT with reports of chronic bilateral knee and R hip pain. Also has pain in multiple bilateral joint sites, saw rheumatology last year but never heard back about results of labs. 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 Denies N/T or any paresthesias down LE.   PERTINENT HISTORY: HTN, see other PMH   PAIN:  Are you having pain?  Yes: NPRS scale: 7/10 Worst: 10/10 Pain location: bilateral knee Pain description: sharp, sore Aggravating factors: prolonged standing, squatting, stairs Relieving factors: rest, medication  PRECAUTIONS: None  RED FLAGS: None   WEIGHT BEARING RESTRICTIONS: No  FALLS:  Has patient fallen in last 6 months? Yes. Number of falls - 1  LIVING ENVIRONMENT: Lives with: lives with their family Lives in: House/apartment Stairs: Yes: External: 13  steps; bilateral but cannot reach both Has following equipment at home: None  OCCUPATION: Product manager   PLOF: Independent  PATIENT GOALS: decrease knee pain, improve functional mobility with stairs and work  NEXT MD VISIT: 07/05/2023  OBJECTIVE:  Note: Objective measures were completed at Evaluation unless otherwise noted.  DIAGNOSTIC FINDINGS: See imaging  PATIENT SURVEYS:  LEFS: 27/80  COGNITION: Overall cognitive status: Within functional limits for tasks  assessed     SENSATION: WFL  POSTURE: rounded shoulders, forward head, and knee vaglus  PALPATION: TTP to bilateral patellar tendon, bilateral patellar hypomobility  LOWER EXTREMITY ROM:  Active ROM Right eval Left eval  Hip flexion    Hip extension    Hip abduction    Hip adduction    Hip internal rotation    Hip external rotation    Knee flexion 115 125  Knee extension 0 0  Ankle dorsiflexion    Ankle plantarflexion    Ankle inversion    Ankle eversion     (Blank rows = not tested)  LOWER EXTREMITY MMT:  MMT Right eval Left eval  Hip flexion 4 4  Hip extension    Hip abduction 3 3  Hip adduction    Hip internal rotation    Hip external rotation    Knee flexion 4 4  Knee extension 4 p! 4  Ankle dorsiflexion    Ankle plantarflexion    Ankle inversion    Ankle eversion     (Blank rows = not tested)  LOWER EXTREMITY SPECIAL TESTS:  Knee special tests: Posterior drawer test: negative and Lachman Test: negative  FUNCTIONAL TESTS:  30 Second Sit to Stand: 4 reps with UE  GAIT: Distance walked: 7ft Assistive device utilized: None Level of assistance: Complete Independence Comments: antalgic gait, decreased gait speed   TREATMENT: [facility] Adult PT Treatment:                                                DATE: 07/08/2023 NuStep lvl 4 UE/LE x 3 min for functional activity tolerance Supine QS x 10 - 5 hold Supine SLR 2x10 ea w/ QS Bridge 2x10 Hooklying clamshell 2x15 blue band Hooklying march 2x20 blue band SAQ 2x10 2.5# LAQ 2x10 2.5# with PT medial glide to L patella McConnell tape to bilateral knees  [facility] Adult PT Treatment:                                                DATE: 07-03-23 Aquatic therapy at [facility]- [address] - therapeutic pool temp approximately 90 degrees.Pt enters building independently. Treatment took place in water 3.8 to  4 ft 8 in. deep depending upon activity.  Pt entered and exited the pool via stair and handrails  independently. Pt pain level 7/10 in bil knees and Right hip   Aquatic Exercise: On submerged bench and on edge of pool holding with one UE   Aquastretch for bil knees over rectus , vastus lateralis and bil medial knees Walking side stepping and high stepping across pool x 4 Standing UE horizontal abduction with green bells walking across pool Standing  UEabduction/adduction with green bells walking across pool Standing rows UE with green resistant bells walking across pool  Heel/ toe raises BIL  Gastroc stretch with foot against pool wall  Hip ext/flex with knee straight  Hip abduction/adduction x10 BIL Hamstring curl 3 way hip stretch with yellow noodle Hip circles CW/CCW  pt fatigueing Stomp with yellow noodle  can only do left side Squats x 20 SL squat Runners stretch bottom step x30 BIL Hamstring stretch bottom step x30 BIL Back L stretch    Pt requires the buoyancy of water for active assisted exercises with buoyancy supported for strengthening and AROM exercises. Hydrostatic pressure also supports joints by unweighting joint load by at least 50 % in 3-4 feet depth water. 80% in chest to neck deep water. Water will provide assistance with movement using the current and laminar flow while the buoyancy reduces weight bearing. Pt requires the viscosity of the water for resistance endurance  with strengthening exercises.   [facility] Adult PT Treatment:                                                DATE: 07/02/2023 NuStep lvl 4 UE/LE x 3 min for functional activity tolerance Supine QS x 10 - 5 hold Supine SLR 2x5 ea w/ QS Hooklying clamshell 2x15 blue band Hooklying march 2x20 blue band SAQ 2x10 2.5# LAQ 2x10 w/ QS Seated ball squeeze x 10 - 5 hold FAQ with ball squeeze 2x10 Standing TKE with ball 2x10 - 5 hold ea Standing hip abd x 10 YTB  PATIENT EDUCATION:  Education details: continue HEP Person educated: Patient Education method: Explanation, Demonstration, and  Handouts Education comprehension: verbalized understanding and returned demonstration  HOME EXERCISE PROGRAM: Access Code: [email or web address] URL: [email or web address] Date: 07/02/2023 Prepared by: Loral Roch  Exercises - Supine Quadricep Sets  - 1 x daily - 7 x weekly - 2 sets - 10 reps - 5 sec hold - Active Straight Leg Raise with Quad Set  - 1 x daily - 7 x weekly - 2-3 sets - 5 reps - Clamshell with Resistance  - 1 x daily - 7 x weekly - 3 sets - 10 reps - red band hold - Seated Long Arc Quad  - 1 x daily - 7 x weekly - 3 sets - 10 reps - with quad squeeze hold - Seated Hip Adduction Isometrics with Ball  - 1 x daily - 7 x weekly - 2 sets - 10 reps - 5 sec hold  ASSESSMENT:  CLINICAL IMPRESSION: Pt was able to complete all prescribed exercises with continued pain in bilateral knee. We continued to focus on improving hip/knee strength in order to decrase pain and improve functional mobility. Trial of McConnell taping with good relief reported. Will continue strengthening in gym and aquatic environments.   EVAL: Patient is a 53 y.o. F who was seen today for physical therapy evaluation and treatment for chronic bilateral knee pain with occasional R hip pain as well. Physical findings are consistent with referring provider impression as pt demonstrates decrease in quad and proximal hip strength and general functional mobility. LEFS score demonstrates severe disability in performance of home ADLs and higher level community activities. Pt would benefit from skilled PT services working on improving quad and hip strength in order to decrease knee pain.   OBJECTIVE IMPAIRMENTS: Abnormal gait, decreased activity tolerance, decreased endurance, decreased mobility, difficulty walking, decreased strength, and pain   ACTIVITY LIMITATIONS: carrying, lifting, sitting, standing,  squatting, stairs, transfers, and locomotion level  PARTICIPATION LIMITATIONS: meal prep, cleaning, driving, shopping,  community activity, occupation, and yard work  PERSONAL FACTORS: Time since onset of injury/illness/exacerbation and 1-2 comorbidities: HTN, see other PMH  are also affecting patient's functional outcome.   REHAB POTENTIAL: Good  CLINICAL DECISION MAKING: Stable/uncomplicated  EVALUATION COMPLEXITY: Low   GOALS: Goals reviewed with patient? No  SHORT TERM GOALS: Target date: 07/09/2023   Pt will be compliant and knowledgeable with initial HEP for improved comfort and carryover Baseline: initial HEP given  Goal status: INITIAL  2.  Pt will self report bilateral knee pain no greater than 6/10 for improved comfort and functional ability Baseline: 10/10 at worst Goal status: INITIAL   LONG TERM GOALS: Target date: 08/13/2023   Pt will improve LEFS to no less than 40/80 as proxy for functional improvement with home ADLs and higher level community activity Baseline: 27/80 Goal status: INITIAL   2.  Pt will self report bilateral knee pain no greater than 3/10 for improved comfort and functional ability Baseline: 10/10 at worst Goal status: INITIAL   3.  Pt will increase 30 Second Sit to Stand rep count to no less than 8 reps for improved balance, strength, and functional mobility Baseline: 4 reps with UE  Goal status: INITIAL   4.  Pt will improve bilateral LE MMT to no less than 4/5 for all tested motions with decreased pain for improved functional mobility and stability to bilateral knees Baseline: see MMT chart Goal status: INITIAL   PLAN:  PT FREQUENCY: 1-2x/week  PT DURATION: 8 weeks  PLANNED INTERVENTIONS: 97164- PT Re-evaluation, 97110-Therapeutic exercises, 97530- Therapeutic activity, [id number]- Neuromuscular re-education, 97535- Self Care, [id number]- Manual therapy, [id number]- Gait training, [phone]- Aquatic Therapy, U9811- Electrical stimulation (unattended), [id number]- Electrical stimulation (manual), 97016- Vasopneumatic device, Cryotherapy, and Moist heat  PLAN FOR NEXT SESSION:  assess HEP response, quad and hip strengthening in gym and pool   Ivor Mars PT  07/08/23 1:06 PM

## 2023-07-10 ENCOUNTER — Ambulatory Visit: Admitting: Physical Therapy

## 2023-07-15 ENCOUNTER — Ambulatory Visit

## 2023-07-15 DIAGNOSIS — M25561 Pain in right knee: Secondary | ICD-10-CM | POA: Diagnosis not present

## 2023-07-15 DIAGNOSIS — R2689 Other abnormalities of gait and mobility: Secondary | ICD-10-CM

## 2023-07-15 DIAGNOSIS — M6281 Muscle weakness (generalized): Secondary | ICD-10-CM

## 2023-07-15 DIAGNOSIS — G8929 Other chronic pain: Secondary | ICD-10-CM

## 2023-07-15 NOTE — Therapy (Signed)
 OUTPATIENT PHYSICAL THERAPY TREATMENT   Patient Name: Kristine Kelly MRN: [id number] DOB:06-27-1970, 53 y.o., female Today's Date: 07/15/2023  END OF SESSION:  PT End of Session - 07/15/23 1144     Visit Number 7    Number of Visits 17    Date for PT Re-Evaluation 08/13/23    Authorization Type UHC    PT Start Time 1145    PT Stop Time 1225    PT Time Calculation (min) 40 min    Activity Tolerance Patient tolerated treatment well    Behavior During Therapy WFL for tasks assessed/performed                Past Medical History:  Diagnosis Date   Anxiety    NO RECENT ANXIETY ATTACKS PER PT ON 10-04-2020   Arthritis    OA LEFT KNEE   Claustrophobia 10/04/2020   COVID-19 2020   COUGH SOB X 6 WEEKS ALL SYMPTOMS RESOLVED   GERD (gastroesophageal reflux disease)    Headache    Hypertension    not currently on medications   Wears glasses 10/04/2020   Past Surgical History:  Procedure Laterality Date   BREAST LUMPECTOMY Right 1991   BENIGN   CHOLECYSTECTOMY  2006   LAPAROSCOPIC   DILATATION AND CURETTAGE/HYSTEROSCOPY WITH MINERVA N/A 07/30/2018   Procedure: DILATATION AND CURETTAGE/HYSTEROSCOPY WITH MINERVA;  Surgeon: Starla Harland BROCKS, MD;  Location: [facility];  Service: Gynecology;  Laterality: N/A;   TUBAL LIGATION     21 YRS AGO PER PT ON 10-04-2020   VAGINAL HYSTERECTOMY  10/11/2020   Procedure: TOTAL HYSTERECTOMY VAGINAL with RIGHT SALPINGECTOMY;  Surgeon: Lorence Ozell CROME, MD;  Location: [facility];  Service: Gynecology;;   Patient Active Problem List   Diagnosis Date Noted   Bilateral chronic knee pain 04/29/2023   Osteoarthritis, multiple sites 11/12/2022   Pain in right hip 11/12/2022   Numbness and tingling 11/12/2022   Post-operative state 11/21/2020   H/O vaginal hysterectomy 11/21/2020   Vaginal discharge 10/05/2020   Menopausal symptoms 09/01/2020   Dermatophytosis 05/25/2020   Obesity 04/28/2018   Anxiety and depression  10/04/2016    PCP: [name], NP  REFERRING PROVIDER: Persons, [name], PA   REFERRING DIAG: M25.561,M25.562,G89.29 (ICD-10-CM) - Bilateral chronic knee pain   THERAPY DIAG:  Chronic pain of both knees  Muscle weakness (generalized)  Other abnormalities of gait and mobility  Rationale for Evaluation and Treatment: Rehabilitation  ONSET DATE: Chronic  SUBJECTIVE:   SUBJECTIVE STATEMENT: Pt presents to PT with reports of continued 4/10 knee pain. Has been compliant with HEP.   EVAL: Pt is well known to therapist and presents to PT with reports of chronic bilateral knee and R hip pain. Also has pain in multiple bilateral joint sites, saw rheumatology last year but never heard back about results of labs. 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 Denies N/T or any paresthesias down LE.   PERTINENT HISTORY: HTN, see other PMH   PRECAUTIONS: None  RED FLAGS: None   WEIGHT BEARING RESTRICTIONS: No  FALLS:  Has patient fallen in last 6 months? Yes. Number of falls - 1  LIVING ENVIRONMENT: Lives with: lives with their family Lives in: House/apartment Stairs: Yes: External: 13 steps; bilateral but cannot reach both Has following equipment at home: None  OCCUPATION: Product manager   PLOF: Independent  PATIENT GOALS: decrease knee pain, improve functional  mobility with stairs and work  NEXT MD VISIT: 07/05/2023  OBJECTIVE:  Note: Objective measures were completed at Evaluation unless otherwise noted.  DIAGNOSTIC FINDINGS: See imaging  PATIENT SURVEYS:  LEFS: 27/80  COGNITION: Overall cognitive status: Within functional limits for tasks assessed     SENSATION: WFL  POSTURE: rounded shoulders, forward head, and knee vaglus  PALPATION: TTP to bilateral patellar tendon, bilateral patellar hypomobility  LOWER EXTREMITY ROM:  Active ROM Right eval Left eval  Hip  flexion    Hip extension    Hip abduction    Hip adduction    Hip internal rotation    Hip external rotation    Knee flexion 115 125  Knee extension 0 0  Ankle dorsiflexion    Ankle plantarflexion    Ankle inversion    Ankle eversion     (Blank rows = not tested)  LOWER EXTREMITY MMT:  MMT Right eval Left eval  Hip flexion 4 4  Hip extension    Hip abduction 3 3  Hip adduction    Hip internal rotation    Hip external rotation    Knee flexion 4 4  Knee extension 4 p! 4  Ankle dorsiflexion    Ankle plantarflexion    Ankle inversion    Ankle eversion     (Blank rows = not tested)  LOWER EXTREMITY SPECIAL TESTS:  Knee special tests: Posterior drawer test: negative and Lachman Test: negative  FUNCTIONAL TESTS:  30 Second Sit to Stand: 4 reps with UE  GAIT: Distance walked: 28ft Assistive device utilized: None Level of assistance: Complete Independence Comments: antalgic gait, decreased gait speed   TREATMENT: [facility] Adult PT Treatment:                                                DATE: 07/15/2023 NuStep lvl 5 UE/LE x 3 min for functional activity tolerance Supine QS x 10 - 5 hold McConnell tape to bilateral knees Supine SLR 2x10 ea w/ QS SAQ 2x10 2.5# S/L hip abd 2x5 Bridge 2x10 with blue band Hooklying clamshell 2x15 blue band LAQ 2x10 2.5# with PT medial glide to L patella  [facility] Adult PT Treatment:                                                DATE: 07/08/2023 NuStep lvl 4 UE/LE x 3 min for functional activity tolerance Supine QS x 10 - 5 hold Supine SLR 2x10 ea w/ QS Bridge 2x10 Hooklying clamshell 2x15 blue band Hooklying march 2x20 blue band SAQ 2x10 2.5# LAQ 2x10 2.5# with PT medial glide to L patella McConnell tape to bilateral knees  [facility] Adult PT Treatment:                                                DATE: 07-03-23 Aquatic therapy at [facility]- [address] - therapeutic pool temp approximately 90 degrees.Pt enters building  independently. Treatment took place in water 3.8 to  4 ft 8 in. deep depending upon activity.  Pt entered and exited the pool via stair and handrails  independently. Pt pain level 7/10 in bil knees and Right hip   Aquatic Exercise: On submerged bench and on edge of pool holding with one UE   Aquastretch for bil knees over rectus , vastus lateralis and bil medial knees Walking side stepping and high stepping across pool x 4 Standing UE horizontal abduction with green bells walking across pool Standing  UEabduction/adduction with green bells walking across pool Standing rows UE with green resistant bells walking across pool Heel/ toe raises BIL  Gastroc stretch with foot against pool wall  Hip ext/flex with knee straight  Hip abduction/adduction x10 BIL Hamstring curl 3 way hip stretch with yellow noodle Hip circles CW/CCW  pt fatigueing Stomp with yellow noodle  can only do left side Squats x 20 SL squat Runners stretch bottom step x30 BIL Hamstring stretch bottom step x30 BIL Back L stretch    Pt requires the buoyancy of water for active assisted exercises with buoyancy supported for strengthening and AROM exercises. Hydrostatic pressure also supports joints by unweighting joint load by at least 50 % in 3-4 feet depth water. 80% in chest to neck deep water. Water will provide assistance with movement using the current and laminar flow while the buoyancy reduces weight bearing. Pt requires the viscosity of the water for resistance endurance  with strengthening exercises.   [facility] Adult PT Treatment:                                                DATE: 07/02/2023 NuStep lvl 4 UE/LE x 3 min for functional activity tolerance Supine QS x 10 - 5 hold Supine SLR 2x5 ea w/ QS Hooklying clamshell 2x15 blue band Hooklying march 2x20 blue band SAQ 2x10 2.5# LAQ 2x10 w/ QS Seated ball squeeze x 10 - 5 hold FAQ with ball squeeze 2x10 Standing TKE with ball 2x10 - 5 hold ea Standing hip  abd x 10 YTB  PATIENT EDUCATION:  Education details: continue HEP Person educated: Patient Education method: Explanation, Demonstration, and Handouts Education comprehension: verbalized understanding and returned demonstration  HOME EXERCISE PROGRAM: Access Code: [email or web address] URL: [email or web address] Date: 07/15/2023 Prepared by: Alm Kingdom  Exercises - Supine Quadricep Sets  - 1 x daily - 7 x weekly - 2 sets - 10 reps - 5 sec hold - Active Straight Leg Raise with Quad Set  - 1 x daily - 7 x weekly - 2-3 sets - 5 reps - Clamshell with Resistance  - 1 x daily - 7 x weekly - 3 sets - 10 reps - red band hold - Seated Long Arc Quad  - 1 x daily - 7 x weekly - 3 sets - 10 reps - with quad squeeze hold - Seated Hip Adduction Isometrics with Ball  - 1 x daily - 7 x weekly - 2 sets - 10 reps - 5 sec hold - Sidelying Hip Abduction  - 1 x daily - 7 x weekly - 2 sets - 5 reps  ASSESSMENT:  CLINICAL IMPRESSION: Pt was able to complete all prescribed exercises with continued pain in bilateral knee. We continued to focus on improving hip/knee strength in order to decrase pain and improve functional mobility. HEP updated for continued progression of lateral hip strengthening. Will continue strengthening in gym and aquatic environments.   EVAL: Patient is a 53 y.o. F who was seen today for  physical therapy evaluation and treatment for chronic bilateral knee pain with occasional R hip pain as well. Physical findings are consistent with referring provider impression as pt demonstrates decrease in quad and proximal hip strength and general functional mobility. LEFS score demonstrates severe disability in performance of home ADLs and higher level community activities. Pt would benefit from skilled PT services working on improving quad and hip strength in order to decrease knee pain.   OBJECTIVE IMPAIRMENTS: Abnormal gait, decreased activity tolerance, decreased endurance, decreased mobility,  difficulty walking, decreased strength, and pain   ACTIVITY LIMITATIONS: carrying, lifting, sitting, standing, squatting, stairs, transfers, and locomotion level  PARTICIPATION LIMITATIONS: meal prep, cleaning, driving, shopping, community activity, occupation, and yard work  PERSONAL FACTORS: Time since onset of injury/illness/exacerbation and 1-2 comorbidities: HTN, see other PMH  are also affecting patient's functional outcome.   REHAB POTENTIAL: Good  CLINICAL DECISION MAKING: Stable/uncomplicated  EVALUATION COMPLEXITY: Low   GOALS: Goals reviewed with patient? No  SHORT TERM GOALS: Target date: 07/09/2023   Pt will be compliant and knowledgeable with initial HEP for improved comfort and carryover Baseline: initial HEP given  Goal status: INITIAL  2.  Pt will self report bilateral knee pain no greater than 6/10 for improved comfort and functional ability Baseline: 10/10 at worst Goal status: INITIAL   LONG TERM GOALS: Target date: 08/13/2023   Pt will improve LEFS to no less than 40/80 as proxy for functional improvement with home ADLs and higher level community activity Baseline: 27/80 Goal status: INITIAL   2.  Pt will self report bilateral knee pain no greater than 3/10 for improved comfort and functional ability Baseline: 10/10 at worst Goal status: INITIAL   3.  Pt will increase 30 Second Sit to Stand rep count to no less than 8 reps for improved balance, strength, and functional mobility Baseline: 4 reps with UE  Goal status: INITIAL   4.  Pt will improve bilateral LE MMT to no less than 4/5 for all tested motions with decreased pain for improved functional mobility and stability to bilateral knees Baseline: see MMT chart Goal status: INITIAL   PLAN:  PT FREQUENCY: 1-2x/week  PT DURATION: 8 weeks  PLANNED INTERVENTIONS: 97164- PT Re-evaluation, 97110-Therapeutic exercises, 97530- Therapeutic activity, [id number]- Neuromuscular re-education, 97535- Self Care,  97140- Manual therapy, [id number]- Gait training, [id number]- Aquatic Therapy, H9716- Electrical stimulation (unattended), [id number]- Electrical stimulation (manual), 97016- Vasopneumatic device, Cryotherapy, and Moist heat  PLAN FOR NEXT SESSION: assess HEP response, quad and hip strengthening in gym and pool   Alm JAYSON Kingdom PT  07/15/23 1:02 PM

## 2023-07-18 ENCOUNTER — Ambulatory Visit: Admitting: Physical Therapy

## 2023-07-18 ENCOUNTER — Telehealth: Payer: Self-pay | Admitting: Emergency Medicine

## 2023-07-18 ENCOUNTER — Institutional Professional Consult (permissible substitution) (INDEPENDENT_AMBULATORY_CARE_PROVIDER_SITE_OTHER): Payer: Self-pay | Admitting: Family Medicine

## 2023-07-18 NOTE — Telephone Encounter (Signed)
 I called patient to let her know PCP will not feel out disability parking placard that her orthopedic would have to do it.  Patient stated she will pick the papers back up tomorrow.

## 2023-07-23 ENCOUNTER — Encounter: Payer: Self-pay | Admitting: Physician Assistant

## 2023-07-23 ENCOUNTER — Ambulatory Visit (INDEPENDENT_AMBULATORY_CARE_PROVIDER_SITE_OTHER): Admitting: Physician Assistant

## 2023-07-23 DIAGNOSIS — M150 Primary generalized (osteo)arthritis: Secondary | ICD-10-CM

## 2023-07-23 NOTE — Progress Notes (Signed)
 Office Visit Note   Patient: Kristine Kelly           Date of Birth: 1970/04/30           MRN: [id number] Visit Date: 07/23/2023              Requested by: [name], NP [address] [place],  [place] [address] PCP: [name], NP  No chief complaint on file.     HPI: Patient is a 53 year old woman with multiple joint pain from arthritis.  She comes in today to check and see how she did on her injections she is also asking for a handicap parking placard  Assessment & Plan: Visit Diagnoses:  1. Primary osteoarthritis involving multiple joints     Plan: She may follow-up as needed I think the injections do help her.  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  She is not at a point where she can retire.  Because of her multiple joint pain and arthritis I did give her a handicap parking placard to reassess this in 5 years  Follow-Up Instructions: No follow-ups on file.   Ortho Exam  Patient is alert, oriented, no adenopathy, well-dressed, normal affect, normal respiratory effort. No effusion no erythema no evidence of infection    Imaging: No results found. No images are attached to the encounter.  Labs: Lab Results  Component Value Date   HGBA1C 5.6 10/18/2021   HGBA1C 5.7 (H) 05/30/2020   HGBA1C CANCELED 05/25/2020   ESRSEDRATE 33 (H) 11/13/2022     Lab Results  Component Value Date   ALBUMIN 4.3 08/22/2020   ALBUMIN 4.0 03/26/2020   ALBUMIN 4.1 12/18/2019    No results found for: MG No results found for: VD25OH  No results found for: PREALBUMIN    Latest Ref Rng & Units 10/11/2020   11:14 PM 10/07/2020    3:02 PM 08/22/2020    7:53 AM  CBC EXTENDED  WBC 4.0 - 10.5 K/uL 11.6  7.6  6.7   RBC 3.87 - 5.11 MIL/uL 4.03  4.46  4.85   Hemoglobin 12.0 - 15.0 g/dL 88.0  86.8  85.7   HCT 36.0 - 46.0 % 35.3  40.0  42.6   Platelets 150 - 400 K/uL 198  218  231   NEUT# 1.7 - 7.7 K/uL   5.0   Lymph# 0.7 - 4.0 K/uL    1.2      There is no height or weight on file to calculate BMI.  Orders:  No orders of the defined types were placed in this encounter.  No orders of the defined types were placed in this encounter.    Procedures: No procedures performed  Clinical Data: No additional findings.  ROS:  All other systems negative, except as noted in the HPI. Review of Systems  Objective: Vital Signs: LMP 09/22/2020   Specialty Comments:  Tobie Julaine Moons, MD - 12/24/2022  Formatting of this note might be different from the original.  CLINICAL DATA:  Back pain, T12 compression fracture.   EXAM:  MRI THORACIC SPINE WITHOUT CONTRAST   TECHNIQUE:  Multiplanar, multisequence MR imaging of the thoracic spine was  performed. No intravenous contrast was administered.   COMPARISON:  None Available.   FINDINGS:  Alignment: Physiologic.   Vertebrae: No acute fracture, evidence of discitis, or aggressive  bone lesion. Small hemangioma in the T5 vertebral body.   Cord:  Normal signal and morphology.  Paraspinal and other soft tissues: No acute paraspinal abnormality.   Disc levels:   Disc spaces:  Disc spaces are maintained.   Degenerative disease with mild disc height loss at T5-6, T6-7, T7-8  and T10-11.   T1-T2: No disc protrusion, foraminal stenosis or central canal  stenosis.   T2-T3: No disc protrusion, foraminal stenosis or central canal  stenosis.   T3-T4: No disc protrusion, foraminal stenosis or central canal  stenosis.   T4-T5: No disc protrusion, foraminal stenosis or central canal  stenosis.   T5-T6: No disc protrusion, foraminal stenosis or central canal  stenosis.   T6-T7: Moderate right paracentral disc protrusion with inferior  migration of disc material contacting the ventral cervical spinal  cord. No foraminal or central canal stenosis.   T7-T8: No disc protrusion, foraminal stenosis or central canal  stenosis.   T8-T9: Small central disc  protrusion. No foraminal or central canal  stenosis.   T9-T10: No disc protrusion, foraminal stenosis or central canal  stenosis.   T10-T11: No disc protrusion, foraminal stenosis or central canal  stenosis.   T11-T12: No disc protrusion, foraminal stenosis or central canal  stenosis.   IMPRESSION:  1. No acute osseous injury of the thoracic spine.  2. At T6-7 there is a moderate right paracentral disc protrusion  with inferior migration of disc material contacting the ventral  cervical spinal cord.  3. At T8-9 there is a small central disc protrusion.  4. No foraminal or central canal stenosis of the thoracic spine.    Electronically Signed    By: Julaine Blanch M.D.    On: 12/24/2022 14:04  PMFS History: Patient Active Problem List   Diagnosis Date Noted   Bilateral chronic knee pain 04/29/2023   Osteoarthritis, multiple sites 11/12/2022   Pain in right hip 11/12/2022   Numbness and tingling 11/12/2022   Post-operative state 11/21/2020   H/O vaginal hysterectomy 11/21/2020   Vaginal discharge 10/05/2020   Menopausal symptoms 09/01/2020   Dermatophytosis 05/25/2020   Obesity 04/28/2018   Anxiety and depression 10/04/2016   Past Medical History:  Diagnosis Date   Anxiety    NO RECENT ANXIETY ATTACKS PER PT ON 10-04-2020   Arthritis    OA LEFT KNEE   Claustrophobia 10/04/2020   COVID-19 2020   COUGH SOB X 6 WEEKS ALL SYMPTOMS RESOLVED   GERD (gastroesophageal reflux disease)    Headache    Hypertension    not currently on medications   Wears glasses 10/04/2020    Family History  Problem Relation Age of Onset   Asthma Mother     Past Surgical History:  Procedure Laterality Date   BREAST LUMPECTOMY Right 1991   BENIGN   CHOLECYSTECTOMY  2006   LAPAROSCOPIC   DILATATION AND CURETTAGE/HYSTEROSCOPY WITH MINERVA N/A 07/30/2018   Procedure: DILATATION AND CURETTAGE/HYSTEROSCOPY WITH MINERVA;  Surgeon: Starla Harland BROCKS, MD;  Location: [facility];   Service: Gynecology;  Laterality: N/A;   TUBAL LIGATION     21 YRS AGO PER PT ON 10-04-2020   VAGINAL HYSTERECTOMY  10/11/2020   Procedure: TOTAL HYSTERECTOMY VAGINAL with RIGHT SALPINGECTOMY;  Surgeon: Lorence Ozell CROME, MD;  Location: [facility];  Service: Gynecology;;   Social History   Occupational History   Not on file  Tobacco Use   Smoking status: Never    Passive exposure: Never   Smokeless tobacco: Never  Vaping Use   Vaping status: Never Used  Substance and Sexual Activity   Alcohol  use: No   Drug use: No   Sexual activity: Yes    Birth control/protection: Surgical

## 2023-07-23 NOTE — Therapy (Signed)
 OUTPATIENT PHYSICAL THERAPY TREATMENT   Patient Name: Kristine Kelly MRN: [id number] DOB:1970-05-08, 53 y.o., female Today's Date: 07/24/2023  END OF SESSION:  PT End of Session - 07/24/23 1501     Visit Number 8    Number of Visits 17    Date for PT Re-Evaluation 08/13/23    Authorization Type [employer]    PT Start Time 1500    PT Stop Time 1547    PT Time Calculation (min) 47 min    Activity Tolerance Patient tolerated treatment well    Behavior During Therapy WFL for tasks assessed/performed                 Past Medical History:  Diagnosis Date   Anxiety    NO RECENT ANXIETY ATTACKS PER PT ON 10-04-2020   Arthritis    OA LEFT KNEE   Claustrophobia 10/04/2020   COVID-19 2020   COUGH SOB X 6 WEEKS ALL SYMPTOMS RESOLVED   GERD (gastroesophageal reflux disease)    Headache    Hypertension    not currently on medications   Wears glasses 10/04/2020   Past Surgical History:  Procedure Laterality Date   BREAST LUMPECTOMY Right 1991   BENIGN   CHOLECYSTECTOMY  2006   LAPAROSCOPIC   DILATATION AND CURETTAGE/HYSTEROSCOPY WITH MINERVA N/A 07/30/2018   Procedure: DILATATION AND CURETTAGE/HYSTEROSCOPY WITH MINERVA;  Surgeon: Starla Harland BROCKS, MD;  Location: [facility];  Service: Gynecology;  Laterality: N/A;   TUBAL LIGATION     21 YRS AGO PER PT ON 10-04-2020   VAGINAL HYSTERECTOMY  10/11/2020   Procedure: TOTAL HYSTERECTOMY VAGINAL with RIGHT SALPINGECTOMY;  Surgeon: Lorence Ozell CROME, MD;  Location: [facility];  Service: Gynecology;;   Patient Active Problem List   Diagnosis Date Noted   Bilateral chronic knee pain 04/29/2023   Osteoarthritis, multiple sites 11/12/2022   Pain in right hip 11/12/2022   Numbness and tingling 11/12/2022   Post-operative state 11/21/2020   H/O vaginal hysterectomy 11/21/2020   Vaginal discharge 10/05/2020   Menopausal symptoms 09/01/2020   Dermatophytosis 05/25/2020   Obesity 04/28/2018   Anxiety and  depression 10/04/2016    PCP: [name], NP  REFERRING PROVIDER: Persons, [name], PA   REFERRING DIAG: M25.561,M25.562,G89.29 (ICD-10-CM) - Bilateral chronic knee pain   THERAPY DIAG:  Chronic pain of both knees  Muscle weakness (generalized)  Other abnormalities of gait and mobility  Pain in thoracic spine  Rationale for Evaluation and Treatment: Rehabilitation  ONSET DATE: Chronic  SUBJECTIVE:   SUBJECTIVE STATEMENT: Pt presents to PT with reports of continued 7/10 knee pain. More complaint over patellar tendon on left today   EVAL: Pt is well known to therapist and presents to PT with reports of chronic bilateral knee and R hip pain. Also has pain in multiple bilateral joint sites, saw rheumatology last year but never heard back about results of labs. 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 Denies N/T or any paresthesias down LE.   PERTINENT HISTORY: HTN, see other PMH   PRECAUTIONS: None  RED FLAGS: None   WEIGHT BEARING RESTRICTIONS: No  FALLS:  Has patient fallen in last 6 months? Yes. Number of falls - 1  LIVING ENVIRONMENT: Lives with: lives with their family Lives in: House/apartment Stairs: Yes: External: 13 steps; bilateral but cannot reach both Has following equipment at home: None  OCCUPATION: International aid/development worker [employer]   PLOF:  Independent  PATIENT GOALS: decrease knee pain, improve functional mobility with stairs and work  NEXT MD VISIT: 07/05/2023  OBJECTIVE:  Note: Objective measures were completed at Evaluation unless otherwise noted.  DIAGNOSTIC FINDINGS: See imaging  PATIENT SURVEYS:  LEFS: 27/80  COGNITION: Overall cognitive status: Within functional limits for tasks assessed     SENSATION: WFL  POSTURE: rounded shoulders, forward head, and knee vaglus  PALPATION: TTP to bilateral patellar tendon, bilateral patellar hypomobility  LOWER  EXTREMITY ROM:  Active ROM Right eval Left eval  Hip flexion    Hip extension    Hip abduction    Hip adduction    Hip internal rotation    Hip external rotation    Knee flexion 115 125  Knee extension 0 0  Ankle dorsiflexion    Ankle plantarflexion    Ankle inversion    Ankle eversion     (Blank rows = not tested)  LOWER EXTREMITY MMT:  MMT Right eval Left eval  Hip flexion 4 4  Hip extension    Hip abduction 3 3  Hip adduction    Hip internal rotation    Hip external rotation    Knee flexion 4 4  Knee extension 4 p! 4  Ankle dorsiflexion    Ankle plantarflexion    Ankle inversion    Ankle eversion     (Blank rows = not tested)  LOWER EXTREMITY SPECIAL TESTS:  Knee special tests: Posterior drawer test: negative and Lachman Test: negative  FUNCTIONAL TESTS:  30 Second Sit to Stand: 4 reps with UE  GAIT: Distance walked: 40ft Assistive device utilized: None Level of assistance: Complete Independence Comments: antalgic gait, decreased gait speed   TREATMENT: [facility] Adult PT Treatment:                                                DATE: 07-23-23 Aquatic therapy at [facility]- [address] - therapeutic pool temp approximately 90 degrees.Pt enters building independently. Treatment took place in water 3.8 to  4 ft 8 in. deep depending upon activity.  Pt entered and exited the pool via stair and handrails independently. Pt pain level 7/10 in bil knees Left patellar tendon and Right hip.   Aquatic Exercise: On submerged bench and on edge of pool holding with one UE   Aquastretch for bil knees over rectus , vastus lateralis and bil medial knees and  left patellar tendon Walking side stepping and high stepping across pool x 4 Runners stretch bottom step x30 BIL Hamstring stretch bottom step x30 BIL Thoracic back stretch Stomp with yellow noodle  can only do left side Squats x 20 SL squat Heel/ toe raises BIL  Gastroc stretch with foot against pool wall   Hip ext/flex with knee straight  Hip abduction/adduction x10 BIL Hamstring curl 3 way hip stretch with yellow noodle Hip circles CW/CCW  pt fatigueing Standing UE horizontal abduction with green bells walking across pool Standing  UEabduction/adduction with green bells walking across pool Standing rows UE with green resistant bells walking across pool   Pt requires the buoyancy of water for active assisted exercises with buoyancy supported for strengthening and AROM exercises. Hydrostatic pressure also supports joints by unweighting joint load by at least 50 % in 3-4 feet depth water. 80% in chest to neck deep water. Water will provide assistance with movement  using the current and laminar flow while the buoyancy reduces weight bearing. Pt requires the viscosity of the water for resistance endurance  with strengthening exercises.   [facility] Adult PT Treatment:                                                DATE: 07/15/2023 NuStep lvl 5 UE/LE x 3 min for functional activity tolerance Supine QS x 10 - 5 hold McConnell tape to bilateral knees Supine SLR 2x10 ea w/ QS SAQ 2x10 2.5# S/L hip abd 2x5 Bridge 2x10 with blue band Hooklying clamshell 2x15 blue band LAQ 2x10 2.5# with PT medial glide to L patella  [facility] Adult PT Treatment:                                                DATE: 07/08/2023 NuStep lvl 4 UE/LE x 3 min for functional activity tolerance Supine QS x 10 - 5 hold Supine SLR 2x10 ea w/ QS Bridge 2x10 Hooklying clamshell 2x15 blue band Hooklying march 2x20 blue band SAQ 2x10 2.5# LAQ 2x10 2.5# with PT medial glide to L patella McConnell tape to bilateral knees  [facility] Adult PT Treatment:                                                DATE: 07-03-23 Aquatic therapy at [facility]- [address] - therapeutic pool temp approximately 90 degrees.Pt enters building independently. Treatment took place in water 3.8 to  4 ft 8 in. deep depending upon activity.  Pt entered and exited  the pool via stair and handrails independently. Pt pain level 7/10 in bil knees and Right hip   Aquatic Exercise: On submerged bench and on edge of pool holding with one UE   Aquastretch for bil knees over rectus , vastus lateralis and bil medial knees Walking side stepping and high stepping across pool x 4 Standing UE horizontal abduction with green bells walking across pool Standing  UEabduction/adduction with green bells walking across pool Standing rows UE with green resistant bells walking across pool Heel/ toe raises BIL  Gastroc stretch with foot against pool wall  Hip ext/flex with knee straight  Hip abduction/adduction x10 BIL Hamstring curl 3 way hip stretch with yellow noodle Hip circles CW/CCW  pt fatigueing Stomp with yellow noodle  can only do left side Squats x 20 SL squat Runners stretch bottom step x30 BIL Hamstring stretch bottom step x30 BIL Back L stretch    Pt requires the buoyancy of water for active assisted exercises with buoyancy supported for strengthening and AROM exercises. Hydrostatic pressure also supports joints by unweighting joint load by at least 50 % in 3-4 feet depth water. 80% in chest to neck deep water. Water will provide assistance with movement using the current and laminar flow while the buoyancy reduces weight bearing. Pt requires the viscosity of the water for resistance endurance  with strengthening exercises.   [facility] Adult PT Treatment:  DATE: 07/02/2023 NuStep lvl 4 UE/LE x 3 min for functional activity tolerance Supine QS x 10 - 5 hold Supine SLR 2x5 ea w/ QS Hooklying clamshell 2x15 blue band Hooklying march 2x20 blue band SAQ 2x10 2.5# LAQ 2x10 w/ QS Seated ball squeeze x 10 - 5 hold FAQ with ball squeeze 2x10 Standing TKE with ball 2x10 - 5 hold ea Standing hip abd x 10 YTB  PATIENT EDUCATION:  Education details: continue HEP Person educated: Patient Education method:  Explanation, Demonstration, and Handouts Education comprehension: verbalized understanding and returned demonstration  HOME EXERCISE PROGRAM: Access Code: [email or web address] URL: [email or web address] Date: 07/15/2023 Prepared by: Alm Kingdom  Exercises - Supine Quadricep Sets  - 1 x daily - 7 x weekly - 2 sets - 10 reps - 5 sec hold - Active Straight Leg Raise with Quad Set  - 1 x daily - 7 x weekly - 2-3 sets - 5 reps - Clamshell with Resistance  - 1 x daily - 7 x weekly - 3 sets - 10 reps - red band hold - Seated Long Arc Quad  - 1 x daily - 7 x weekly - 3 sets - 10 reps - with quad squeeze hold - Seated Hip Adduction Isometrics with Ball  - 1 x daily - 7 x weekly - 2 sets - 10 reps - 5 sec hold - Sidelying Hip Abduction  - 1 x daily - 7 x weekly - 2 sets - 5 reps  ASSESSMENT:  CLINICAL IMPRESSION: Mindee presents with 7/10 pain in bil knees and Right hip. Pt comments that she has decreased pain in the water.  Session today focused on establishing aquatic HEP, general strengthening  tasks in the aquatic environment for use of buoyancy to offload joints and the viscosity of water as resistance during therapeutic exercise. Patient was able to tolerate all prescribed exercises in the aquatic environment with no adverse effects.  Patient continues to benefit from skilled PT services on land and aquatic based and should be progressed as able to improve functional independence   EVAL: Patient is a 53 y.o. F who was seen today for physical therapy evaluation and treatment for chronic bilateral knee pain with occasional R hip pain as well. Physical findings are consistent with referring provider impression as pt demonstrates decrease in quad and proximal hip strength and general functional mobility. LEFS score demonstrates severe disability in performance of home ADLs and higher level community activities. Pt would benefit from skilled PT services working on improving quad and hip strength in  order to decrease knee pain.   OBJECTIVE IMPAIRMENTS: Abnormal gait, decreased activity tolerance, decreased endurance, decreased mobility, difficulty walking, decreased strength, and pain   ACTIVITY LIMITATIONS: carrying, lifting, sitting, standing, squatting, stairs, transfers, and locomotion level  PARTICIPATION LIMITATIONS: meal prep, cleaning, driving, shopping, community activity, occupation, and yard work  PERSONAL FACTORS: Time since onset of injury/illness/exacerbation and 1-2 comorbidities: HTN, see other PMH  are also affecting patient's functional outcome.   REHAB POTENTIAL: Good  CLINICAL DECISION MAKING: Stable/uncomplicated  EVALUATION COMPLEXITY: Low   GOALS: Goals reviewed with patient? No  SHORT TERM GOALS: Target date: 07/09/2023   Pt will be compliant and knowledgeable with initial HEP for improved comfort and carryover Baseline: initial HEP given  Goal status: INITIAL  2.  Pt will self report bilateral knee pain no greater than 6/10 for improved comfort and functional ability Baseline: 10/10 at worst Goal status: INITIAL   LONG TERM GOALS: Target date: 08/13/2023   Pt  will improve LEFS to no less than 40/80 as proxy for functional improvement with home ADLs and higher level community activity Baseline: 27/80 Goal status: INITIAL   2.  Pt will self report bilateral knee pain no greater than 3/10 for improved comfort and functional ability Baseline: 10/10 at worst Goal status: INITIAL   3.  Pt will increase 30 Second Sit to Stand rep count to no less than 8 reps for improved balance, strength, and functional mobility Baseline: 4 reps with UE  Goal status: INITIAL   4.  Pt will improve bilateral LE MMT to no less than 4/5 for all tested motions with decreased pain for improved functional mobility and stability to bilateral knees Baseline: see MMT chart Goal status: INITIAL   PLAN:  PT FREQUENCY: 1-2x/week  PT DURATION: 8 weeks  PLANNED  INTERVENTIONS: 97164- PT Re-evaluation, 97110-Therapeutic exercises, 97530- Therapeutic activity, 97112- Neuromuscular re-education, 97535- Self Care, [id number]- Manual therapy, [id number]- Gait training, [phone]- Aquatic Therapy, [phone]- Electrical stimulation (unattended), [id number]- Electrical stimulation (manual), 97016- Vasopneumatic device, Cryotherapy, and Moist heat  PLAN FOR NEXT SESSION: assess HEP response, quad and hip strengthening in gym and pool   Graydon Dingwall, PT, [facility] Certified Exercise Expert for the Aging Adult  07/24/23 4:06 PM Phone: [phone] Fax: [phone]

## 2023-07-24 ENCOUNTER — Ambulatory Visit: Attending: Physician Assistant | Admitting: Physical Therapy

## 2023-07-24 ENCOUNTER — Encounter: Payer: Self-pay | Admitting: Physical Therapy

## 2023-07-24 DIAGNOSIS — G8929 Other chronic pain: Secondary | ICD-10-CM | POA: Insufficient documentation

## 2023-07-24 DIAGNOSIS — R2689 Other abnormalities of gait and mobility: Secondary | ICD-10-CM | POA: Insufficient documentation

## 2023-07-24 DIAGNOSIS — M25562 Pain in left knee: Secondary | ICD-10-CM | POA: Insufficient documentation

## 2023-07-24 DIAGNOSIS — M546 Pain in thoracic spine: Secondary | ICD-10-CM | POA: Diagnosis present

## 2023-07-24 DIAGNOSIS — M25561 Pain in right knee: Secondary | ICD-10-CM | POA: Insufficient documentation

## 2023-07-24 DIAGNOSIS — M6281 Muscle weakness (generalized): Secondary | ICD-10-CM | POA: Diagnosis present

## 2023-07-31 ENCOUNTER — Ambulatory Visit: Admitting: Physical Therapy

## 2023-08-02 ENCOUNTER — Ambulatory Visit

## 2023-08-05 ENCOUNTER — Ambulatory Visit

## 2023-08-05 DIAGNOSIS — G8929 Other chronic pain: Secondary | ICD-10-CM

## 2023-08-05 DIAGNOSIS — M6281 Muscle weakness (generalized): Secondary | ICD-10-CM

## 2023-08-05 DIAGNOSIS — M25561 Pain in right knee: Secondary | ICD-10-CM | POA: Diagnosis not present

## 2023-08-05 DIAGNOSIS — R2689 Other abnormalities of gait and mobility: Secondary | ICD-10-CM

## 2023-08-05 NOTE — Therapy (Signed)
 OUTPATIENT PHYSICAL THERAPY TREATMENT   Patient Name: Kristine Kelly MRN: [id number] DOB:[birth year], 53 y.o., female Today's Date: 08/05/2023  END OF SESSION:  PT End of Session - 08/05/23 1007     Visit Number 9    Number of Visits 17    Date for PT Re-Evaluation 08/13/23    Authorization Type [employer]    PT Start Time 1015    PT Stop Time 1055    PT Time Calculation (min) 40 min    Activity Tolerance Patient tolerated treatment well    Behavior During Therapy WFL for tasks assessed/performed                  Past Medical History:  Diagnosis Date   Anxiety    NO RECENT ANXIETY ATTACKS PER PT ON 10-04-2020   Arthritis    OA LEFT KNEE   Claustrophobia 10/04/2020   COVID-19 2020   COUGH SOB X 6 WEEKS ALL SYMPTOMS RESOLVED   GERD (gastroesophageal reflux disease)    Headache    Hypertension    not currently on medications   Wears glasses 10/04/2020   Past Surgical History:  Procedure Laterality Date   BREAST LUMPECTOMY Right 1991   BENIGN   CHOLECYSTECTOMY  2006   LAPAROSCOPIC   DILATATION AND CURETTAGE/HYSTEROSCOPY WITH MINERVA N/A 07/30/2018   Procedure: DILATATION AND CURETTAGE/HYSTEROSCOPY WITH MINERVA;  Surgeon: Starla Harland BROCKS, MD;  Location: [facility];  Service: Gynecology;  Laterality: N/A;   TUBAL LIGATION     21 YRS AGO PER PT ON 10-04-2020   VAGINAL HYSTERECTOMY  10/11/2020   Procedure: TOTAL HYSTERECTOMY VAGINAL with RIGHT SALPINGECTOMY;  Surgeon: Lorence Ozell CROME, MD;  Location: [facility];  Service: Gynecology;;   Patient Active Problem List   Diagnosis Date Noted   Bilateral chronic knee pain 04/29/2023   Osteoarthritis, multiple sites 11/12/2022   Pain in right hip 11/12/2022   Numbness and tingling 11/12/2022   Post-operative state 11/21/2020   H/O vaginal hysterectomy 11/21/2020   Vaginal discharge 10/05/2020   Menopausal symptoms 09/01/2020   Dermatophytosis 05/25/2020   Obesity 04/28/2018   Anxiety and  depression 10/04/2016    PCP: [name], NP  REFERRING PROVIDER: Persons, [name], PA   REFERRING DIAG: M25.561,M25.562,G89.29 (ICD-10-CM) - Bilateral chronic knee pain   THERAPY DIAG:  Chronic pain of both knees  Muscle weakness (generalized)  Other abnormalities of gait and mobility  Rationale for Evaluation and Treatment: Rehabilitation  ONSET DATE: Chronic  SUBJECTIVE:   SUBJECTIVE STATEMENT: Pt presents to PT with severe R knee pain at 10/10. Her role has changed at work, she now stands for roughly 8 hours and this really hurts her knees. She is looking for a knee compression sleeve to try.   EVAL: Pt is well known to therapist and presents to PT with reports of chronic bilateral knee and R hip pain. Also has pain in multiple bilateral joint sites, saw rheumatology last year but never heard back about results of labs. 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 Denies N/T or any paresthesias down LE.   PERTINENT HISTORY: HTN, see other PMH   PRECAUTIONS: None  RED FLAGS: None   WEIGHT BEARING RESTRICTIONS: No  FALLS:  Has patient fallen in last 6 months? Yes. Number of falls - 1  LIVING ENVIRONMENT: Lives with: lives with their family Lives in: House/apartment Stairs: Yes: External: 13 steps; bilateral but  cannot reach both Has following equipment at home: None  OCCUPATION: Product manager   PLOF: Independent  PATIENT GOALS: decrease knee pain, improve functional mobility with stairs and work  NEXT MD VISIT: 07/05/2023  OBJECTIVE:  Note: Objective measures were completed at Evaluation unless otherwise noted.  DIAGNOSTIC FINDINGS: See imaging  PATIENT SURVEYS:  LEFS: 27/80  COGNITION: Overall cognitive status: Within functional limits for tasks assessed     SENSATION: WFL  POSTURE: rounded shoulders, forward head, and knee vaglus  PALPATION: TTP to  bilateral patellar tendon, bilateral patellar hypomobility  LOWER EXTREMITY ROM:  Active ROM Right eval Left eval  Hip flexion    Hip extension    Hip abduction    Hip adduction    Hip internal rotation    Hip external rotation    Knee flexion 115 125  Knee extension 0 0  Ankle dorsiflexion    Ankle plantarflexion    Ankle inversion    Ankle eversion     (Blank rows = not tested)  LOWER EXTREMITY MMT:  MMT Right eval Left eval  Hip flexion 4 4  Hip extension    Hip abduction 3 3  Hip adduction    Hip internal rotation    Hip external rotation    Knee flexion 4 4  Knee extension 4 p! 4  Ankle dorsiflexion    Ankle plantarflexion    Ankle inversion    Ankle eversion     (Blank rows = not tested)  LOWER EXTREMITY SPECIAL TESTS:  Knee special tests: Posterior drawer test: negative and Lachman Test: negative  FUNCTIONAL TESTS:  30 Second Sit to Stand: 4 reps with UE  GAIT: Distance walked: 18ft Assistive device utilized: None Level of assistance: Complete Independence Comments: antalgic gait, decreased gait speed   TREATMENT: [facility] Adult PT Treatment:                                                DATE: 08/05/2023 NuStep lvl 5 UE/LE x 3 min for functional activity tolerance Supine QS x 10 - 5 hold McConnell tape to bilateral knees Supine SLR 2x10 ea w/ QS S/L hip abd 2x5 ea LAQ 2x10 ea STS 2x5 with UE - high table FAQ with ball 2x10 Modalities:  MHP to bilateral quads and knees x 10 min post session  [facility] Adult PT Treatment:                                                DATE: 07-23-23 Aquatic therapy at [facility]- [address] - therapeutic pool temp approximately 90 degrees.Pt enters building independently. Treatment took place in water 3.8 to  4 ft 8 in. deep depending upon activity.  Pt entered and exited the pool via stair and handrails independently. Pt pain level 7/10 in bil knees Left patellar tendon and Right hip.   Aquatic Exercise: On  submerged bench and on edge of pool holding with one UE   Aquastretch for bil knees over rectus , vastus lateralis and bil medial knees and  left patellar tendon Walking side stepping and high stepping across pool x 4 Runners stretch bottom step x30 BIL Hamstring stretch bottom step x30 BIL Thoracic back stretch Stomp with  yellow noodle  can only do left side Squats x 20 SL squat Heel/ toe raises BIL  Gastroc stretch with foot against pool wall  Hip ext/flex with knee straight  Hip abduction/adduction x10 BIL Hamstring curl 3 way hip stretch with yellow noodle Hip circles CW/CCW  pt fatigueing Standing UE horizontal abduction with green bells walking across pool Standing  UEabduction/adduction with green bells walking across pool Standing rows UE with green resistant bells walking across pool   Pt requires the buoyancy of water for active assisted exercises with buoyancy supported for strengthening and AROM exercises. Hydrostatic pressure also supports joints by unweighting joint load by at least 50 % in 3-4 feet depth water. 80% in chest to neck deep water. Water will provide assistance with movement using the current and laminar flow while the buoyancy reduces weight bearing. Pt requires the viscosity of the water for resistance endurance  with strengthening exercises.   [facility] Adult PT Treatment:                                                DATE: 07/15/2023 NuStep lvl 5 UE/LE x 3 min for functional activity tolerance Supine QS x 10 - 5 hold McConnell tape to bilateral knees Supine SLR 2x10 ea w/ QS SAQ 2x10 2.5# S/L hip abd 2x5 Bridge 2x10 with blue band Hooklying clamshell 2x15 blue band LAQ 2x10 2.5# with PT medial glide to L patella  [facility] Adult PT Treatment:                                                DATE: 07/08/2023 NuStep lvl 4 UE/LE x 3 min for functional activity tolerance Supine QS x 10 - 5 hold Supine SLR 2x10 ea w/ QS Bridge 2x10 Hooklying clamshell 2x15  blue band Hooklying march 2x20 blue band SAQ 2x10 2.5# LAQ 2x10 2.5# with PT medial glide to L patella McConnell tape to bilateral knees  [facility] Adult PT Treatment:                                                DATE: 07-03-23 Aquatic therapy at [facility]- [address] - therapeutic pool temp approximately 90 degrees.Pt enters building independently. Treatment took place in water 3.8 to  4 ft 8 in. deep depending upon activity.  Pt entered and exited the pool via stair and handrails independently. Pt pain level 7/10 in bil knees and Right hip   Aquatic Exercise: On submerged bench and on edge of pool holding with one UE   Aquastretch for bil knees over rectus , vastus lateralis and bil medial knees Walking side stepping and high stepping across pool x 4 Standing UE horizontal abduction with green bells walking across pool Standing  UEabduction/adduction with green bells walking across pool Standing rows UE with green resistant bells walking across pool Heel/ toe raises BIL  Gastroc stretch with foot against pool wall  Hip ext/flex with knee straight  Hip abduction/adduction x10 BIL Hamstring curl 3 way hip stretch with yellow noodle Hip circles CW/CCW  pt fatigueing Stomp with yellow noodle  can only do left side Squats x 20 SL squat Runners stretch bottom step x30 BIL Hamstring stretch bottom step x30 BIL Back L stretch    Pt requires the buoyancy of water for active assisted exercises with buoyancy supported for strengthening and AROM exercises. Hydrostatic pressure also supports joints by unweighting joint load by at least 50 % in 3-4 feet depth water. 80% in chest to neck deep water. Water will provide assistance with movement using the current and laminar flow while the buoyancy reduces weight bearing. Pt requires the viscosity of the water for resistance endurance  with strengthening exercises.   [facility] Adult PT Treatment:                                                 DATE: 07/02/2023 NuStep lvl 4 UE/LE x 3 min for functional activity tolerance Supine QS x 10 - 5 hold Supine SLR 2x5 ea w/ QS Hooklying clamshell 2x15 blue band Hooklying march 2x20 blue band SAQ 2x10 2.5# LAQ 2x10 w/ QS Seated ball squeeze x 10 - 5 hold FAQ with ball squeeze 2x10 Standing TKE with ball 2x10 - 5 hold ea Standing hip abd x 10 YTB  PATIENT EDUCATION:  Education details: continue HEP Person educated: Patient Education method: Explanation, Demonstration, and Handouts Education comprehension: verbalized understanding and returned demonstration  HOME EXERCISE PROGRAM: Access Code: [email or web address] URL: [email or web address] Date: 07/15/2023 Prepared by: Alm Kingdom  Exercises - Supine Quadricep Sets  - 1 x daily - 7 x weekly - 2 sets - 10 reps - 5 sec hold - Active Straight Leg Raise with Quad Set  - 1 x daily - 7 x weekly - 2-3 sets - 5 reps - Clamshell with Resistance  - 1 x daily - 7 x weekly - 3 sets - 10 reps - red band hold - Seated Long Arc Quad  - 1 x daily - 7 x weekly - 3 sets - 10 reps - with quad squeeze hold - Seated Hip Adduction Isometrics with Ball  - 1 x daily - 7 x weekly - 2 sets - 10 reps - 5 sec hold - Sidelying Hip Abduction  - 1 x daily - 7 x weekly - 2 sets - 5 reps  ASSESSMENT:  CLINICAL IMPRESSION: Pt was able to complete all prescribed exercises with no adverse effect. Exercises today focused on improving quad and knee stability in order to decrease knee pain. She will see aquatics next which seems to benefit her more than gym exercise to this point. We will assess progress at next gym visit.   EVAL: Patient is a 53 y.o. F who was seen today for physical therapy evaluation and treatment for chronic bilateral knee pain with occasional R hip pain as well. Physical findings are consistent with referring provider impression as pt demonstrates decrease in quad and proximal hip strength and general functional mobility. LEFS score  demonstrates severe disability in performance of home ADLs and higher level community activities. Pt would benefit from skilled PT services working on improving quad and hip strength in order to decrease knee pain.   OBJECTIVE IMPAIRMENTS: Abnormal gait, decreased activity tolerance, decreased endurance, decreased mobility, difficulty walking, decreased strength, and pain   ACTIVITY LIMITATIONS: carrying, lifting, sitting, standing, squatting, stairs, transfers, and locomotion level  PARTICIPATION LIMITATIONS: meal prep, cleaning, driving, shopping, community activity, occupation, and  yard work  PERSONAL FACTORS: Time since onset of injury/illness/exacerbation and 1-2 comorbidities: HTN, see other PMH  are also affecting patient's functional outcome.   REHAB POTENTIAL: Good  CLINICAL DECISION MAKING: Stable/uncomplicated  EVALUATION COMPLEXITY: Low   GOALS: Goals reviewed with patient? No  SHORT TERM GOALS: Target date: 07/09/2023   Pt will be compliant and knowledgeable with initial HEP for improved comfort and carryover Baseline: initial HEP given  Goal status: INITIAL  2.  Pt will self report bilateral knee pain no greater than 6/10 for improved comfort and functional ability Baseline: 10/10 at worst Goal status: INITIAL   LONG TERM GOALS: Target date: 08/13/2023   Pt will improve LEFS to no less than 40/80 as proxy for functional improvement with home ADLs and higher level community activity Baseline: 27/80 Goal status: INITIAL   2.  Pt will self report bilateral knee pain no greater than 3/10 for improved comfort and functional ability Baseline: 10/10 at worst Goal status: INITIAL   3.  Pt will increase 30 Second Sit to Stand rep count to no less than 8 reps for improved balance, strength, and functional mobility Baseline: 4 reps with UE  Goal status: INITIAL   4.  Pt will improve bilateral LE MMT to no less than 4/5 for all tested motions with decreased pain for  improved functional mobility and stability to bilateral knees Baseline: see MMT chart Goal status: INITIAL   PLAN:  PT FREQUENCY: 1-2x/week  PT DURATION: 8 weeks  PLANNED INTERVENTIONS: 97164- PT Re-evaluation, 97110-Therapeutic exercises, 97530- Therapeutic activity, [id number]- Neuromuscular re-education, 97535- Self Care, [id number]- Manual therapy, [id number]- Gait training, [phone]- Aquatic Therapy, [phone]- Electrical stimulation (unattended), [id number]- Electrical stimulation (manual), 97016- Vasopneumatic device, Cryotherapy, and Moist heat  PLAN FOR NEXT SESSION: assess HEP response, quad and hip strengthening in gym and pool   Alm JAYSON Kingdom PT  08/05/23 11:24 AM

## 2023-08-06 NOTE — Therapy (Signed)
 OUTPATIENT PHYSICAL THERAPY TREATMENT   Patient Name: Kristine Kelly MRN: [id number] DOB:[birth year], 53 y.o., female Today's Date: 08/07/2023  END OF SESSION:  PT End of Session - 08/07/23 1500     Visit Number 10    Number of Visits 17    Date for PT Re-Evaluation 08/13/23    Authorization Type [employer]    PT Start Time 1458    PT Stop Time 1545    PT Time Calculation (min) 47 min    Activity Tolerance Patient tolerated treatment well    Behavior During Therapy WFL for tasks assessed/performed                   Past Medical History:  Diagnosis Date   Anxiety    NO RECENT ANXIETY ATTACKS PER PT ON 10-04-2020   Arthritis    OA LEFT KNEE   Claustrophobia 10/04/2020   COVID-19 2020   COUGH SOB X 6 WEEKS ALL SYMPTOMS RESOLVED   GERD (gastroesophageal reflux disease)    Headache    Hypertension    not currently on medications   Wears glasses 10/04/2020   Past Surgical History:  Procedure Laterality Date   BREAST LUMPECTOMY Right 1991   BENIGN   CHOLECYSTECTOMY  2006   LAPAROSCOPIC   DILATATION AND CURETTAGE/HYSTEROSCOPY WITH MINERVA N/A 07/30/2018   Procedure: DILATATION AND CURETTAGE/HYSTEROSCOPY WITH MINERVA;  Surgeon: Starla Harland BROCKS, MD;  Location: [facility];  Service: Gynecology;  Laterality: N/A;   TUBAL LIGATION     21 YRS AGO PER PT ON 10-04-2020   VAGINAL HYSTERECTOMY  10/11/2020   Procedure: TOTAL HYSTERECTOMY VAGINAL with RIGHT SALPINGECTOMY;  Surgeon: Lorence Ozell CROME, MD;  Location: [facility];  Service: Gynecology;;   Patient Active Problem List   Diagnosis Date Noted   Bilateral chronic knee pain 04/29/2023   Osteoarthritis, multiple sites 11/12/2022   Pain in right hip 11/12/2022   Numbness and tingling 11/12/2022   Post-operative state 11/21/2020   H/O vaginal hysterectomy 11/21/2020   Vaginal discharge 10/05/2020   Menopausal symptoms 09/01/2020   Dermatophytosis 05/25/2020   Obesity 04/28/2018   Anxiety and  depression 10/04/2016    PCP: [name], NP  REFERRING PROVIDER: Persons, [name], PA   REFERRING DIAG: M25.561,M25.562,G89.29 (ICD-10-CM) - Bilateral chronic knee pain   THERAPY DIAG:  Chronic pain of both knees  Muscle weakness (generalized)  Other abnormalities of gait and mobility  Rationale for Evaluation and Treatment: Rehabilitation  ONSET DATE: Chronic  SUBJECTIVE:   SUBJECTIVE STATEMENT: Pt presents to PT with R knee pain at 8/10. Continuing to work and stand for 8 hours and knee. I was going up the steps yesterday and my knee just  gave out I caught myself but I am I am getting weary of it.   EVAL: Pt is well known to therapist and presents to PT with reports of chronic bilateral knee and R hip pain. Also has pain in multiple bilateral joint sites, saw rheumatology last year but never heard back about results of labs. 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 Denies N/T or any paresthesias down LE.   PERTINENT HISTORY: HTN, see other PMH   PRECAUTIONS: None  RED FLAGS: None   WEIGHT BEARING RESTRICTIONS: No  FALLS:  Has patient fallen in last 6 months? Yes. Number of falls - 1  LIVING ENVIRONMENT: Lives with: lives with their family Lives in: House/apartment  Stairs: Yes: External: 13 steps; bilateral but cannot reach both Has following equipment at home: None  OCCUPATION: Product manager   PLOF: Independent  PATIENT GOALS: decrease knee pain, improve functional mobility with stairs and work  NEXT MD VISIT: 07/05/2023  OBJECTIVE:  Note: Objective measures were completed at Evaluation unless otherwise noted.  DIAGNOSTIC FINDINGS: See imaging  PATIENT SURVEYS:  LEFS: 27/80  COGNITION: Overall cognitive status: Within functional limits for tasks assessed     SENSATION: WFL  POSTURE: rounded shoulders, forward head, and knee vaglus  PALPATION: TTP  to bilateral patellar tendon, bilateral patellar hypomobility  LOWER EXTREMITY ROM:  Active ROM Right eval Left eval  Hip flexion    Hip extension    Hip abduction    Hip adduction    Hip internal rotation    Hip external rotation    Knee flexion 115 125  Knee extension 0 0  Ankle dorsiflexion    Ankle plantarflexion    Ankle inversion    Ankle eversion     (Blank rows = not tested)  LOWER EXTREMITY MMT:  MMT Right eval Left eval  Hip flexion 4 4  Hip extension    Hip abduction 3 3  Hip adduction    Hip internal rotation    Hip external rotation    Knee flexion 4 4  Knee extension 4 p! 4  Ankle dorsiflexion    Ankle plantarflexion    Ankle inversion    Ankle eversion     (Blank rows = not tested)  LOWER EXTREMITY SPECIAL TESTS:  Knee special tests: Posterior drawer test: negative and Lachman Test: negative  FUNCTIONAL TESTS:  30 Second Sit to Stand: 4 reps with UE  GAIT: Distance walked: 31ft Assistive device utilized: None Level of assistance: Complete Independence Comments: antalgic gait, decreased gait speed   TREATMENT: [facility] Adult PT Treatment:                                                DATE: 08-07-23 Aquatic therapy at [facility] [address] - therapeutic pool temp approximately 90 degrees.Pt enters building independently. Treatment took place in water 3.8 to  4 ft 8 in. deep depending upon activity.  Pt entered and exited the pool via stair and handrails independently. Pt pain level 8/10 in bil knees Left patellar tendon mostly irritated   Aquatic Exercise: Access Code: [email or web address] URL: [email or web address]  Cat Cow in Shallow Water with Pool Noodle  Full Triangle Pose in [employer] with Pool Noodle   Lunge to Target at [employer]   3 -way Leg Stretch with Pool Noodle  - Holding wall - hip abduction Hip Swing  - 1 x daily - 7 x weekly - 3 sets - 10 reps Standing Shoulder Horizontal Abduction Yellow DB Farmer's Walk    Step Up  - 1 x daily - 7 x weekly - 3 sets - 10 reps Sit to Stand Without Arm Support Standing 'L' Stretch at Counter Hip Flexor Stretch at [employer]   Additional exercises not in program:   Aquastretch for bil knees over rectus , vastus lateralis and left medial knee and  left patellar tendon Additional exercise after review and demo of HEP Runners stretch bottom step x30 BIL Hamstring stretch bottom step x30 BIL   Pt requires the buoyancy of water for active assisted exercises  with buoyancy supported for strengthening and AROM exercises. Hydrostatic pressure also supports joints by unweighting joint load by at least 50 % in 3-4 feet depth water. 80% in chest to neck deep water. Water will provide assistance with movement using the current and laminar flow while the buoyancy reduces weight bearing. Pt requires the viscosity of the water for resistance endurance  with strengthening exercises.  Access Code: [email or web address] URL: [email or web address]  Exercises   [facility] Adult PT Treatment:                                                DATE: 08/05/2023 NuStep lvl 5 UE/LE x 3 min for functional activity tolerance Supine QS x 10 - 5 hold McConnell tape to bilateral knees Supine SLR 2x10 ea w/ QS S/L hip abd 2x5 ea LAQ 2x10 ea STS 2x5 with UE - high table FAQ with ball 2x10 Modalities:  MHP to bilateral quads and knees x 10 min post session  [facility] Adult PT Treatment:                                                DATE: 07-23-23 Aquatic therapy at [facility] [address] - therapeutic pool temp approximately 90 degrees.Pt enters building independently. Treatment took place in water 3.8 to  4 ft 8 in. deep depending upon activity.  Pt entered and exited the pool via stair and handrails independently. Pt pain level 7/10 in bil knees Left patellar tendon and Right hip.   Aquatic Exercise: On submerged bench and on edge of pool holding with one UE   Aquastretch for bil knees  over rectus , vastus lateralis and bil medial knees and  left patellar tendon Walking side stepping and high stepping across pool x 4 Runners stretch bottom step x30 BIL Hamstring stretch bottom step x30 BIL Thoracic back stretch Stomp with yellow noodle  can only do left side Squats x 20 SL squat Heel/ toe raises BIL  Gastroc stretch with foot against pool wall  Hip ext/flex with knee straight  Hip abduction/adduction x10 BIL Hamstring curl 3 way hip stretch with yellow noodle Hip circles CW/CCW  pt fatigueing Standing UE horizontal abduction with green bells walking across pool Standing  UEabduction/adduction with green bells walking across pool Standing rows UE with green resistant bells walking across pool   Pt requires the buoyancy of water for active assisted exercises with buoyancy supported for strengthening and AROM exercises. Hydrostatic pressure also supports joints by unweighting joint load by at least 50 % in 3-4 feet depth water. 80% in chest to neck deep water. Water will provide assistance with movement using the current and laminar flow while the buoyancy reduces weight bearing. Pt requires the viscosity of the water for resistance endurance  with strengthening exercises.   [facility] Adult PT Treatment:                                                DATE: 07/15/2023 NuStep lvl 5 UE/LE x 3 min for functional activity tolerance Supine QS x 10 - 5 hold McConnell tape to  bilateral knees Supine SLR 2x10 ea w/ QS SAQ 2x10 2.5# S/L hip abd 2x5 Bridge 2x10 with blue band Hooklying clamshell 2x15 blue band LAQ 2x10 2.5# with PT medial glide to L patella  [facility] Adult PT Treatment:                                                DATE: 07/08/2023 NuStep lvl 4 UE/LE x 3 min for functional activity tolerance Supine QS x 10 - 5 hold Supine SLR 2x10 ea w/ QS Bridge 2x10 Hooklying clamshell 2x15 blue band Hooklying march 2x20 blue band SAQ 2x10 2.5# LAQ 2x10 2.5# with PT medial  glide to L patella McConnell tape to bilateral knees  [facility] Adult PT Treatment:                                                DATE: 07-03-23 Aquatic therapy at [facility] [address] - therapeutic pool temp approximately 90 degrees.Pt enters building independently. Treatment took place in water 3.8 to  4 ft 8 in. deep depending upon activity.  Pt entered and exited the pool via stair and handrails independently. Pt pain level 7/10 in bil knees and Right hip   Aquatic Exercise: On submerged bench and on edge of pool holding with one UE   Aquastretch for bil knees over rectus , vastus lateralis and bil medial knees Walking side stepping and high stepping across pool x 4 Standing UE horizontal abduction with green bells walking across pool Standing  UEabduction/adduction with green bells walking across pool Standing rows UE with green resistant bells walking across pool Heel/ toe raises BIL  Gastroc stretch with foot against pool wall  Hip ext/flex with knee straight  Hip abduction/adduction x10 BIL Hamstring curl 3 way hip stretch with yellow noodle Hip circles CW/CCW  pt fatigueing Stomp with yellow noodle  can only do left side Squats x 20 SL squat Runners stretch bottom step x30 BIL Hamstring stretch bottom step x30 BIL Back L stretch    Pt requires the buoyancy of water for active assisted exercises with buoyancy supported for strengthening and AROM exercises. Hydrostatic pressure also supports joints by unweighting joint load by at least 50 % in 3-4 feet depth water. 80% in chest to neck deep water. Water will provide assistance with movement using the current and laminar flow while the buoyancy reduces weight bearing. Pt requires the viscosity of the water for resistance endurance  with strengthening exercises.   [facility] Adult PT Treatment:                                                DATE: 07/02/2023 NuStep lvl 4 UE/LE x 3 min for functional activity tolerance Supine QS  x 10 - 5 hold Supine SLR 2x5 ea w/ QS Hooklying clamshell 2x15 blue band Hooklying march 2x20 blue band SAQ 2x10 2.5# LAQ 2x10 w/ QS Seated ball squeeze x 10 - 5 hold FAQ with ball squeeze 2x10 Standing TKE with ball 2x10 - 5 hold ea Standing hip abd x 10 YTB  PATIENT EDUCATION:  Education  details: continue HEP Person educated: Patient Education method: Explanation, Demonstration, and Handouts Education comprehension: verbalized understanding and returned demonstration  HOME EXERCISE PROGRAM: Access Code: [email or web address] URL: [email or web address] Date: 07/15/2023 Prepared by: Alm Kingdom  Exercises - Supine Quadricep Sets  - 1 x daily - 7 x weekly - 2 sets - 10 reps - 5 sec hold - Active Straight Leg Raise with Quad Set  - 1 x daily - 7 x weekly - 2-3 sets - 5 reps - Clamshell with Resistance  - 1 x daily - 7 x weekly - 3 sets - 10 reps - red band hold - Seated Long Arc Quad  - 1 x daily - 7 x weekly - 3 sets - 10 reps - with quad squeeze hold - Seated Hip Adduction Isometrics with Ball  - 1 x daily - 7 x weekly - 2 sets - 10 reps - 5 sec hold - Sidelying Hip Abduction  - 1 x daily - 7 x weekly - 2 sets - 5 reps Aquatic HEP Access Code: [email or web address] URL: [email or web address] Date: 08/06/2023 Prepared by: Graydon Dingwall  Exercises - Cat Cow in Shallow Water with Pool Noodle  - 1 x daily - 1-3 x weekly - 1 sets - 5-10 reps - Full Triangle Pose in Shallow Water with Pool Noodle  - 1 x daily - 1-3 x weekly - 1 sets - 5 reps - 15-30 hold - Lunge to Target at [employer]  - 1 x daily - 1-3 x weekly - 2 sets - 10 reps - 3 -way Leg Stretch with Pool Noodle  - 1 x daily - 7 x weekly - 3 sets - 10 reps - Holding wall - hip abduction  - 1 x daily - 7 x weekly - 3 sets - 10 reps - Hip Swing  - 1 x daily - 7 x weekly - 3 sets - 10 reps - Standing Shoulder Horizontal Abduction With Dumbbells  - 1 x daily - 7 x weekly - 3 sets - 10 reps - Farmer's Walk  - 1 x daily  - 7 x weekly - 3 sets - 10 reps - Step Up  - 1 x daily - 7 x weekly - 3 sets - 10 reps - Sit to Stand Without Arm Support  - 1 x daily - 7 x weekly - 3 sets - 10 reps - Standing 'L' Stretch at Counter  - 1 x daily - 7 x weekly - 3 sets - 10 reps - Hip Flexor Stretch at [employer]  - 1 x daily - 7 x weekly - 3 sets - 10 reps ASSESSMENT:  CLINICAL IMPRESSION: Kristine Kelly was given HEP with laminated sheets to take to pool.  HEP reviewed with pt demonstration and all exercise and questions answered about correct execution. Kristine Kelly presents with 8/10 pain in bil knees and Right hip with left knee worse than right. Pt comments that she has decreased pain in the water.  Session today focused on giving and reviewing aquatic HEP, general strengthening  tasks in the aquatic environment for use of buoyancy to offload joints and the viscosity of water as resistance during therapeutic exercise. Patient was able to tolerate all prescribed exercises in the aquatic environment with no adverse effects.  Pt is frustrated with continuing pain in knees. Patient continues to benefit from skilled PT services on land and aquatic based and should be progressed as able to improve functional independence.  EVAL: Patient is a 53 y.o. F who was  seen today for physical therapy evaluation and treatment for chronic bilateral knee pain with occasional R hip pain as well. Physical findings are consistent with referring provider impression as pt demonstrates decrease in quad and proximal hip strength and general functional mobility. LEFS score demonstrates severe disability in performance of home ADLs and higher level community activities. Pt would benefit from skilled PT services working on improving quad and hip strength in order to decrease knee pain.   OBJECTIVE IMPAIRMENTS: Abnormal gait, decreased activity tolerance, decreased endurance, decreased mobility, difficulty walking, decreased strength, and pain   ACTIVITY LIMITATIONS:  carrying, lifting, sitting, standing, squatting, stairs, transfers, and locomotion level  PARTICIPATION LIMITATIONS: meal prep, cleaning, driving, shopping, community activity, occupation, and yard work  PERSONAL FACTORS: Time since onset of injury/illness/exacerbation and 1-2 comorbidities: HTN, see other PMH  are also affecting patient's functional outcome.   REHAB POTENTIAL: Good  CLINICAL DECISION MAKING: Stable/uncomplicated  EVALUATION COMPLEXITY: Low   GOALS: Goals reviewed with patient? No  SHORT TERM GOALS: Target date: 07/09/2023   Pt will be compliant and knowledgeable with initial HEP for improved comfort and carryover Baseline: initial HEP given  Goal status: INITIAL  2.  Pt will self report bilateral knee pain no greater than 6/10 for improved comfort and functional ability Baseline: 10/10 at worst Goal status: INITIAL   LONG TERM GOALS: Target date: 08/13/2023   Pt will improve LEFS to no less than 40/80 as proxy for functional improvement with home ADLs and higher level community activity Baseline: 27/80 Goal status: INITIAL   2.  Pt will self report bilateral knee pain no greater than 3/10 for improved comfort and functional ability Baseline: 10/10 at worst Goal status: INITIAL   3.  Pt will increase 30 Second Sit to Stand rep count to no less than 8 reps for improved balance, strength, and functional mobility Baseline: 4 reps with UE  Goal status: INITIAL   4.  Pt will improve bilateral LE MMT to no less than 4/5 for all tested motions with decreased pain for improved functional mobility and stability to bilateral knees Baseline: see MMT chart Goal status: INITIAL   PLAN:  PT FREQUENCY: 1-2x/week  PT DURATION: 8 weeks  PLANNED INTERVENTIONS: 97164- PT Re-evaluation, 97110-Therapeutic exercises, 97530- Therapeutic activity, 97112- Neuromuscular re-education, 97535- Self Care, [id number]- Manual therapy, [id number]- Gait training, [phone]- Aquatic Therapy, [phone]-  Electrical stimulation (unattended), [id number]- Electrical stimulation (manual), 97016- Vasopneumatic device, Cryotherapy, and Moist heat  PLAN FOR NEXT SESSION: assess HEP response, quad and hip strengthening in gym and pool   Graydon Dingwall, PT, [facility] Certified Exercise Expert for the Aging Adult  08/07/23 4:31 PM Phone: [phone] Fax: [phone]

## 2023-08-07 ENCOUNTER — Encounter: Payer: Self-pay | Admitting: Physical Therapy

## 2023-08-07 ENCOUNTER — Ambulatory Visit: Admitting: Physical Therapy

## 2023-08-07 DIAGNOSIS — R2689 Other abnormalities of gait and mobility: Secondary | ICD-10-CM

## 2023-08-07 DIAGNOSIS — M25561 Pain in right knee: Secondary | ICD-10-CM | POA: Diagnosis not present

## 2023-08-07 DIAGNOSIS — M6281 Muscle weakness (generalized): Secondary | ICD-10-CM

## 2023-08-07 DIAGNOSIS — G8929 Other chronic pain: Secondary | ICD-10-CM

## 2023-08-12 ENCOUNTER — Ambulatory Visit

## 2023-08-12 DIAGNOSIS — R2689 Other abnormalities of gait and mobility: Secondary | ICD-10-CM

## 2023-08-12 DIAGNOSIS — M6281 Muscle weakness (generalized): Secondary | ICD-10-CM

## 2023-08-12 DIAGNOSIS — M25561 Pain in right knee: Secondary | ICD-10-CM | POA: Diagnosis not present

## 2023-08-12 DIAGNOSIS — G8929 Other chronic pain: Secondary | ICD-10-CM

## 2023-08-12 NOTE — Therapy (Signed)
 OUTPATIENT PHYSICAL THERAPY TREATMENT   Patient Name: Kristine Kelly MRN: [id number] DOB:October 07, 1970, 53 y.o., female Today's Date: 08/12/2023  END OF SESSION:  PT End of Session - 08/12/23 1357     Visit Number 11    Number of Visits 17    Date for PT Re-Evaluation 08/13/23    Authorization Type [employer]    PT Start Time 1400    PT Stop Time 1442    PT Time Calculation (min) 42 min    Activity Tolerance Patient tolerated treatment well    Behavior During Therapy WFL for tasks assessed/performed                    Past Medical History:  Diagnosis Date   Anxiety    NO RECENT ANXIETY ATTACKS PER PT ON 10-04-2020   Arthritis    OA LEFT KNEE   Claustrophobia 10/04/2020   COVID-19 2020   COUGH SOB X 6 WEEKS ALL SYMPTOMS RESOLVED   GERD (gastroesophageal reflux disease)    Headache    Hypertension    not currently on medications   Wears glasses 10/04/2020   Past Surgical History:  Procedure Laterality Date   BREAST LUMPECTOMY Right 1991   BENIGN   CHOLECYSTECTOMY  2006   LAPAROSCOPIC   DILATATION AND CURETTAGE/HYSTEROSCOPY WITH MINERVA N/A 07/30/2018   Procedure: DILATATION AND CURETTAGE/HYSTEROSCOPY WITH MINERVA;  Surgeon: Starla Harland BROCKS, MD;  Location: [facility];  Service: Gynecology;  Laterality: N/A;   TUBAL LIGATION     21 YRS AGO PER PT ON 10-04-2020   VAGINAL HYSTERECTOMY  10/11/2020   Procedure: TOTAL HYSTERECTOMY VAGINAL with RIGHT SALPINGECTOMY;  Surgeon: Lorence Ozell CROME, MD;  Location: [facility];  Service: Gynecology;;   Patient Active Problem List   Diagnosis Date Noted   Bilateral chronic knee pain 04/29/2023   Osteoarthritis, multiple sites 11/12/2022   Pain in right hip 11/12/2022   Numbness and tingling 11/12/2022   Post-operative state 11/21/2020   H/O vaginal hysterectomy 11/21/2020   Vaginal discharge 10/05/2020   Menopausal symptoms 09/01/2020   Dermatophytosis 05/25/2020   Obesity 04/28/2018   Anxiety and  depression 10/04/2016    PCP: [name], NP  REFERRING PROVIDER: Persons, [name], PA   REFERRING DIAG: M25.561,M25.562,G89.29 (ICD-10-CM) - Bilateral chronic knee pain   THERAPY DIAG:  Chronic pain of both knees  Muscle weakness (generalized)  Other abnormalities of gait and mobility  Rationale for Evaluation and Treatment: Rehabilitation  ONSET DATE: Chronic  SUBJECTIVE:   SUBJECTIVE STATEMENT: Pt presents to PT with reports of continued severe L knee pain, currently 7/10. Has been compliant with HEP and had muscle soreness after aquatic therapy.   EVAL: Pt is well known to therapist and presents to PT with reports of chronic bilateral knee and R hip pain. Also has pain in multiple bilateral joint sites, saw rheumatology last year but never heard back about results of labs. 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 Denies N/T or any paresthesias down LE.   PERTINENT HISTORY: HTN, see other PMH   PRECAUTIONS: None  RED FLAGS: None   WEIGHT BEARING RESTRICTIONS: No  FALLS:  Has patient fallen in last 6 months? Yes. Number of falls - 1  LIVING ENVIRONMENT: Lives with: lives with their family Lives in: House/apartment Stairs: Yes: External: 13 steps; bilateral but cannot reach both Has following equipment at home: None  OCCUPATION: Geophysicist/field seismologist  [employer]   PLOF: Independent  PATIENT GOALS: decrease knee pain, improve functional mobility with stairs and work  NEXT MD VISIT: 07/05/2023  OBJECTIVE:  Note: Objective measures were completed at Evaluation unless otherwise noted.  DIAGNOSTIC FINDINGS: See imaging  PATIENT SURVEYS:  LEFS: 27/80  COGNITION: Overall cognitive status: Within functional limits for tasks assessed     SENSATION: WFL  POSTURE: rounded shoulders, forward head, and knee vaglus  PALPATION: TTP to bilateral patellar tendon, bilateral patellar  hypomobility  LOWER EXTREMITY ROM:  Active ROM Right eval Left eval  Hip flexion    Hip extension    Hip abduction    Hip adduction    Hip internal rotation    Hip external rotation    Knee flexion 115 125  Knee extension 0 0  Ankle dorsiflexion    Ankle plantarflexion    Ankle inversion    Ankle eversion     (Blank rows = not tested)  LOWER EXTREMITY MMT:  MMT Right eval Left eval  Hip flexion 4 4  Hip extension    Hip abduction 3 3  Hip adduction    Hip internal rotation    Hip external rotation    Knee flexion 4 4  Knee extension 4 p! 4  Ankle dorsiflexion    Ankle plantarflexion    Ankle inversion    Ankle eversion     (Blank rows = not tested)  LOWER EXTREMITY SPECIAL TESTS:  Knee special tests: Posterior drawer test: negative and Lachman Test: negative  FUNCTIONAL TESTS:  30 Second Sit to Stand: 4 reps with UE  GAIT: Distance walked: 26ft Assistive device utilized: None Level of assistance: Complete [place] Comments: antalgic gait, decreased gait speed   TREATMENT: [facility] Adult PT Treatment:                                                DATE: 08/12/2023 NuStep lvl 4 UE/LE x 3 min for functional activity tolerance Supine QS x 10 - 5 hold Supine SLR 3x10 ea w/ QS Bridge with blue band 2x10 Hooklying clamshell x 15 blue band S/L hip abd 2x10 ea FAQ with ball 2x15 LAQ 3x10 ea 2.5# Seated hamstring curl 3x10 20# Modalities:  MHP to bilateral quads and knees x 10 min post session  [facility] Adult PT Treatment:                                                DATE: 08-07-23 Aquatic therapy at [facility]- [address] - therapeutic pool temp approximately 90 degrees.Pt enters building independently. Treatment took place in water 3.8 to  4 ft 8 in. deep depending upon activity.  Pt entered and exited the pool via stair and handrails independently. Pt pain level 8/10 in bil knees Left patellar tendon mostly irritated   Aquatic Exercise: Access  Code: [email or web address] URL: [email or web address]  Cat Cow in Shallow Water with Pool Noodle  Full Triangle Pose in Shallow Water with Pool Noodle   Lunge to Target at [employer]   3 -way Leg Stretch with Pool Noodle  - Holding wall - hip abduction Hip Swing  - 1 x daily - 7 x weekly - 3 sets - 10 reps Standing Shoulder  Horizontal Abduction Yellow DB Farmer's Walk   Step Up  - 1 x daily - 7 x weekly - 3 sets - 10 reps Sit to Stand Without Arm Support Standing 'L' Stretch at [facility] Hip Flexor Stretch at [employer]   Additional exercises not in program:   Aquastretch for bil knees over rectus , vastus lateralis and left medial knee and  left patellar tendon Additional exercise after review and demo of HEP Runners stretch bottom step x30 BIL Hamstring stretch bottom step x30 BIL   Pt requires the buoyancy of water for active assisted exercises with buoyancy supported for strengthening and AROM exercises. Hydrostatic pressure also supports joints by unweighting joint load by at least 50 % in 3-4 feet depth water. 80% in chest to neck deep water. Water will provide assistance with movement using the current and laminar flow while the buoyancy reduces weight bearing. Pt requires the viscosity of the water for resistance endurance  with strengthening exercises.  Access Code: [email or web address] URL: [email or web address]  Exercises   [facility] Adult PT Treatment:                                                DATE: 08/05/2023 NuStep lvl 5 UE/LE x 3 min for functional activity tolerance Supine QS x 10 - 5 hold McConnell tape to bilateral knees Supine SLR 2x10 ea w/ QS S/L hip abd 2x5 ea LAQ 2x10 ea STS 2x5 with UE - high table FAQ with ball 2x10 Modalities:  MHP to bilateral quads and knees x 10 min post session  PATIENT EDUCATION:  Education details: continue HEP Person educated: Patient Education method: Explanation, Demonstration, and Handouts Education comprehension:  verbalized understanding and returned demonstration  HOME EXERCISE PROGRAM: Access Code: [email or web address] URL: [email or web address] Date: 07/15/2023 Prepared by: Alm Kingdom  Exercises - Supine Quadricep Sets  - 1 x daily - 7 x weekly - 2 sets - 10 reps - 5 sec hold - Active Straight Leg Raise with Quad Set  - 1 x daily - 7 x weekly - 2-3 sets - 5 reps - Clamshell with Resistance  - 1 x daily - 7 x weekly - 3 sets - 10 reps - red band hold - Seated Long Arc Quad  - 1 x daily - 7 x weekly - 3 sets - 10 reps - with quad squeeze hold - Seated Hip Adduction Isometrics with Ball  - 1 x daily - 7 x weekly - 2 sets - 10 reps - 5 sec hold - Sidelying Hip Abduction  - 1 x daily - 7 x weekly - 2 sets - 5 reps Aquatic HEP Access Code: [email or web address] URL: [email or web address] Date: 08/06/2023 Prepared by: Graydon Dingwall  Exercises - Cat Cow in Shallow Water with Pool Noodle  - 1 x daily - 1-3 x weekly - 1 sets - 5-10 reps - Full Triangle Pose in Shallow Water with Pool Noodle  - 1 x daily - 1-3 x weekly - 1 sets - 5 reps - 15-30 hold - Lunge to Target at [employer]  - 1 x daily - 1-3 x weekly - 2 sets - 10 reps - 3 -way Leg Stretch with Pool Noodle  - 1 x daily - 7 x weekly - 3 sets - 10 reps - Holding wall - hip abduction  - 1 x daily - 7 x weekly -  3 sets - 10 reps - Hip Swing  - 1 x daily - 7 x weekly - 3 sets - 10 reps - Standing Shoulder Horizontal Abduction With Dumbbells  - 1 x daily - 7 x weekly - 3 sets - 10 reps - Farmer's Walk  - 1 x daily - 7 x weekly - 3 sets - 10 reps - Step Up  - 1 x daily - 7 x weekly - 3 sets - 10 reps - Sit to Stand Without Arm Support  - 1 x daily - 7 x weekly - 3 sets - 10 reps - Standing 'L' Stretch at Counter  - 1 x daily - 7 x weekly - 3 sets - 10 reps - Hip Flexor Stretch at [employer]  - 1 x daily - 7 x weekly - 3 sets - 10 reps  ASSESSMENT:  CLINICAL IMPRESSION: Pt was able to complete all prescribed exercises with no adverse  effect. We continued to progress LE strength with particular emphasis on quad and lateral hip. Pt would benefit from continued strengthening bilaterally. Will continue to progress as able per POC.   EVAL: Patient is a 53 y.o. F who was seen today for physical therapy evaluation and treatment for chronic bilateral knee pain with occasional R hip pain as well. Physical findings are consistent with referring provider impression as pt demonstrates decrease in quad and proximal hip strength and general functional mobility. LEFS score demonstrates severe disability in performance of home ADLs and higher level community activities. Pt would benefit from skilled PT services working on improving quad and hip strength in order to decrease knee pain.   OBJECTIVE IMPAIRMENTS: Abnormal gait, decreased activity tolerance, decreased endurance, decreased mobility, difficulty walking, decreased strength, and pain   ACTIVITY LIMITATIONS: carrying, lifting, sitting, standing, squatting, stairs, transfers, and locomotion level  PARTICIPATION LIMITATIONS: meal prep, cleaning, driving, shopping, community activity, occupation, and yard work  PERSONAL FACTORS: Time since onset of injury/illness/exacerbation and 1-2 comorbidities: HTN, see other PMH  are also affecting patient's functional outcome.   REHAB POTENTIAL: Good  CLINICAL DECISION MAKING: Stable/uncomplicated  EVALUATION COMPLEXITY: Low   GOALS: Goals reviewed with patient? No  SHORT TERM GOALS: Target date: 07/09/2023   Pt will be compliant and knowledgeable with initial HEP for improved comfort and carryover Baseline: initial HEP given  Goal status: INITIAL  2.  Pt will self report bilateral knee pain no greater than 6/10 for improved comfort and functional ability Baseline: 10/10 at worst Goal status: INITIAL   LONG TERM GOALS: Target date: 08/13/2023   Pt will improve LEFS to no less than 40/80 as proxy for functional improvement with home ADLs  and higher level community activity Baseline: 27/80 Goal status: INITIAL   2.  Pt will self report bilateral knee pain no greater than 3/10 for improved comfort and functional ability Baseline: 10/10 at worst Goal status: INITIAL   3.  Pt will increase 30 Second Sit to Stand rep count to no less than 8 reps for improved balance, strength, and functional mobility Baseline: 4 reps with UE  Goal status: INITIAL   4.  Pt will improve bilateral LE MMT to no less than 4/5 for all tested motions with decreased pain for improved functional mobility and stability to bilateral knees Baseline: see MMT chart Goal status: INITIAL   PLAN:  PT FREQUENCY: 1-2x/week  PT DURATION: 8 weeks  PLANNED INTERVENTIONS: 97164- PT Re-evaluation, 97110-Therapeutic exercises, 97530- Therapeutic activity, 97112- Neuromuscular re-education, 97535- Self Care, [id number]-  Manual therapy, [id number]- Gait training, [id number]- Aquatic Therapy, [id number]- Electrical stimulation (unattended), [id number]- Electrical stimulation (manual), 97016- Vasopneumatic device, Cryotherapy, and Moist heat  PLAN FOR NEXT SESSION: assess HEP response, quad and hip strengthening in gym and pool   Alm JAYSON Kingdom PT  08/12/23 2:57 PM

## 2023-08-14 ENCOUNTER — Ambulatory Visit: Admitting: Physical Therapy

## 2023-08-14 ENCOUNTER — Encounter: Payer: Self-pay | Admitting: Physical Therapy

## 2023-08-14 DIAGNOSIS — R2689 Other abnormalities of gait and mobility: Secondary | ICD-10-CM

## 2023-08-14 DIAGNOSIS — M6281 Muscle weakness (generalized): Secondary | ICD-10-CM

## 2023-08-14 DIAGNOSIS — G8929 Other chronic pain: Secondary | ICD-10-CM

## 2023-08-14 DIAGNOSIS — M25561 Pain in right knee: Secondary | ICD-10-CM | POA: Diagnosis not present

## 2023-08-14 DIAGNOSIS — M546 Pain in thoracic spine: Secondary | ICD-10-CM

## 2023-08-14 NOTE — Therapy (Signed)
 OUTPATIENT PHYSICAL THERAPY TREATMENT   Patient Name: Kristine Kelly MRN: [id number] DOB:[birth year], 53 y.o., female Today's Date: 08/14/2023  END OF SESSION:  PT End of Session - 08/14/23 1505     Visit Number 12    Number of Visits 17    Date for PT Re-Evaluation 08/13/23    Authorization Type [employer]    PT Start Time 1500    PT Stop Time 1545    PT Time Calculation (min) 45 min    Activity Tolerance Patient tolerated treatment well    Behavior During Therapy WFL for tasks assessed/performed                     Past Medical History:  Diagnosis Date   Anxiety    NO RECENT ANXIETY ATTACKS PER PT ON 10-04-2020   Arthritis    OA LEFT KNEE   Claustrophobia 10/04/2020   COVID-19 2020   COUGH SOB X 6 WEEKS ALL SYMPTOMS RESOLVED   GERD (gastroesophageal reflux disease)    Headache    Hypertension    not currently on medications   Wears glasses 10/04/2020   Past Surgical History:  Procedure Laterality Date   BREAST LUMPECTOMY Right 1991   BENIGN   CHOLECYSTECTOMY  2006   LAPAROSCOPIC   DILATATION AND CURETTAGE/HYSTEROSCOPY WITH MINERVA N/A 07/30/2018   Procedure: DILATATION AND CURETTAGE/HYSTEROSCOPY WITH MINERVA;  Surgeon: Starla Harland BROCKS, MD;  Location: [facility];  Service: Gynecology;  Laterality: N/A;   TUBAL LIGATION     21 YRS AGO PER PT ON 10-04-2020   VAGINAL HYSTERECTOMY  10/11/2020   Procedure: TOTAL HYSTERECTOMY VAGINAL with RIGHT SALPINGECTOMY;  Surgeon: Lorence Ozell CROME, MD;  Location: [facility];  Service: Gynecology;;   Patient Active Problem List   Diagnosis Date Noted   Bilateral chronic knee pain 04/29/2023   Osteoarthritis, multiple sites 11/12/2022   Pain in right hip 11/12/2022   Numbness and tingling 11/12/2022   Post-operative state 11/21/2020   H/O vaginal hysterectomy 11/21/2020   Vaginal discharge 10/05/2020   Menopausal symptoms 09/01/2020   Dermatophytosis 05/25/2020   Obesity 04/28/2018   Anxiety and  depression 10/04/2016    PCP: [name], NP  REFERRING PROVIDER: Persons, [name], PA   REFERRING DIAG: M25.561,M25.562,G89.29 (ICD-10-CM) - Bilateral chronic knee pain   THERAPY DIAG:  Chronic pain of both knees  Muscle weakness (generalized)  Other abnormalities of gait and mobility  Pain in thoracic spine  Rationale for Evaluation and Treatment: Rehabilitation  ONSET DATE: Chronic  SUBJECTIVE:   SUBJECTIVE STATEMENT: Pt presents to PT with reports of continued severe L knee pain 7/10.  Pt states that taping helps and is wondering if bracing will help. Pt states she has been mostly compliant with HEP she can do in bed  EVAL: Pt is well known to therapist and presents to PT with reports of chronic bilateral knee and R hip pain. Also has pain in multiple bilateral joint sites, saw rheumatology last year but never heard back about results of labs. 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 Denies N/T or any paresthesias down LE.   PERTINENT HISTORY: HTN, see other PMH   PRECAUTIONS: None  RED FLAGS: None   WEIGHT BEARING RESTRICTIONS: No  FALLS:  Has patient fallen in last 6 months? Yes. Number of falls - 1  LIVING ENVIRONMENT: Lives with: lives with their family Lives in: House/apartment  Stairs: Yes: External: 13 steps; bilateral but cannot reach both Has following equipment at home: None  OCCUPATION: Product manager   PLOF: Independent  PATIENT GOALS: decrease knee pain, improve functional mobility with stairs and work  NEXT MD VISIT: 07/05/2023  OBJECTIVE:  Note: Objective measures were completed at Evaluation unless otherwise noted.  DIAGNOSTIC FINDINGS: See imaging  PATIENT SURVEYS:  LEFS: 27/80  COGNITION: Overall cognitive status: Within functional limits for tasks assessed     SENSATION: WFL  POSTURE: rounded shoulders, forward head, and knee  vaglus  PALPATION: TTP to bilateral patellar tendon, bilateral patellar hypomobility  LOWER EXTREMITY ROM:  Active ROM Right eval Left eval  Hip flexion    Hip extension    Hip abduction    Hip adduction    Hip internal rotation    Hip external rotation    Knee flexion 115 125  Knee extension 0 0  Ankle dorsiflexion    Ankle plantarflexion    Ankle inversion    Ankle eversion     (Blank rows = not tested)  LOWER EXTREMITY MMT:  MMT Right eval Left eval  Hip flexion 4 4  Hip extension    Hip abduction 3 3  Hip adduction    Hip internal rotation    Hip external rotation    Knee flexion 4 4  Knee extension 4 p! 4  Ankle dorsiflexion    Ankle plantarflexion    Ankle inversion    Ankle eversion     (Blank rows = not tested)  LOWER EXTREMITY SPECIAL TESTS:  Knee special tests: Posterior drawer test: negative and Lachman Test: negative  FUNCTIONAL TESTS:  30 Second Sit to Stand: 4 reps with UE  GAIT: Distance walked: 47ft Assistive device utilized: None Level of assistance: Complete Independence Comments: antalgic gait, decreased gait speed   TREATMENT: [facility] Adult PT Treatment:                                                DATE: 08-14-23 Aquatic therapy at [facility]- [address] - therapeutic pool temp approximately 90 degrees.Pt enters building independently. Treatment took place in water 3.8 to  4 ft 8 in. deep depending upon activity.  Pt entered and exited the pool via stair and handrails independently. Pt pain level 8/10 in bil knees Left patellar tendon mostly irritated   Aquatic Exercise: Aquastretch for Left  knees over rectus , vastus lateralis and left medial knee and  left patellar tendon Pt benefits from PT with lateral pull over left patella with sit to stand exercises.  Lunge to Target at [employer]   Squats 3 -way Leg Stretch with Pool Noodle  - Holding wall - hip abduction Hip Swing  Step Up  Sit to Stand Without Arm Support  Standing 'L' Stretch at [facility] Hip Flexor Stretch at [employer]  Cat Cow in Shallow Water with [employer] stretch with PT assist Supine floating  LAD of Left and Right LE Supine  alternating hip extension Side stepping with yellow DB    Pt requires the buoyancy of water for active assisted exercises with buoyancy supported for strengthening and AROM exercises. Hydrostatic pressure also supports joints by unweighting joint load by at least 50 % in 3-4 feet depth water. 80% in chest to neck deep water. Water will provide assistance with movement  using the current and laminar flow while the buoyancy reduces weight bearing. Pt requires the viscosity of the water for resistance endurance  with strengthening exercises.      [facility] Adult PT Treatment:                                                DATE: 08/12/2023 NuStep lvl 4 UE/LE x 3 min for functional activity tolerance Supine QS x 10 - 5 hold Supine SLR 3x10 ea w/ QS Bridge with blue band 2x10 Hooklying clamshell x 15 blue band S/L hip abd 2x10 ea FAQ with ball 2x15 LAQ 3x10 ea 2.5# Seated hamstring curl 3x10 20# Modalities:  MHP to bilateral quads and knees x 10 min post session  [facility] - [facility]patient Adult PT Treatment:                                                DATE: 08-07-23 Aquatic therapy at [facility]- [address] - therapeutic pool temp approximately 90 degrees.Pt enters building independently. Treatment took place in water 3.8 to  4 ft 8 in. deep depending upon activity.  Pt entered and exited the pool via stair and handrails independently. Pt pain level 8/10 in bil knees Left patellar tendon mostly irritated   Aquatic Exercise: Access Code: [email or web address] URL: [email or web address]  Cat Cow in Shallow Water with Pool Noodle  Full Triangle Pose in [employer] with Pool Noodle   Lunge to Target at [employer]   3 -way Leg Stretch with Pool Noodle  - Holding wall - hip abduction Hip Swing  - 1 x daily - 7 x weekly  - 3 sets - 10 reps Standing Shoulder Horizontal Abduction Yellow DB Farmer's Walk   Step Up  - 1 x daily - 7 x weekly - 3 sets - 10 reps Sit to Stand Without Arm Support Standing 'L' Stretch at Counter Hip Flexor Stretch at [employer]   Additional exercises not in program:   Aquastretch for bil knees over rectus , vastus lateralis and left medial knee and  left patellar tendon Additional exercise after review and demo of HEP Runners stretch bottom step x30 BIL Hamstring stretch bottom step x30 BIL   Pt requires the buoyancy of water for active assisted exercises with buoyancy supported for strengthening and AROM exercises. Hydrostatic pressure also supports joints by unweighting joint load by at least 50 % in 3-4 feet depth water. 80% in chest to neck deep water. Water will provide assistance with movement using the current and laminar flow while the buoyancy reduces weight bearing. Pt requires the viscosity of the water for resistance endurance  with strengthening exercises.  Access Code: [email or web address] URL: [email or web address]  Exercises   [facility] Adult PT Treatment:                                                DATE: 08/05/2023 NuStep lvl 5 UE/LE x 3 min for functional activity tolerance Supine QS x 10 - 5 hold McConnell tape to bilateral knees Supine SLR 2x10 ea w/ QS S/L hip abd 2x5 ea LAQ 2x10 ea  STS 2x5 with UE - high table FAQ with ball 2x10 Modalities:  MHP to bilateral quads and knees x 10 min post session  PATIENT EDUCATION:  Education details: continue HEP Person educated: Patient Education method: Explanation, Demonstration, and Handouts Education comprehension: verbalized understanding and returned demonstration  HOME EXERCISE PROGRAM: Access Code: [email or web address] URL: [email or web address] Date: 07/15/2023 Prepared by: Alm Kingdom  Exercises - Supine Quadricep Sets  - 1 x daily - 7 x weekly - 2 sets - 10 reps - 5 sec hold - Active  Straight Leg Raise with Quad Set  - 1 x daily - 7 x weekly - 2-3 sets - 5 reps - Clamshell with Resistance  - 1 x daily - 7 x weekly - 3 sets - 10 reps - red band hold - Seated Long Arc Quad  - 1 x daily - 7 x weekly - 3 sets - 10 reps - with quad squeeze hold - Seated Hip Adduction Isometrics with Ball  - 1 x daily - 7 x weekly - 2 sets - 10 reps - 5 sec hold - Sidelying Hip Abduction  - 1 x daily - 7 x weekly - 2 sets - 5 reps Aquatic HEP Access Code: [email or web address] URL: [email or web address] Date: 08/06/2023 Prepared by: Graydon Dingwall  Exercises - Cat Cow in Shallow Water with Pool Noodle  - 1 x daily - 1-3 x weekly - 1 sets - 5-10 reps - Full Triangle Pose in Shallow Water with Pool Noodle  - 1 x daily - 1-3 x weekly - 1 sets - 5 reps - 15-30 hold - Lunge to Target at [employer]  - 1 x daily - 1-3 x weekly - 2 sets - 10 reps - 3 -way Leg Stretch with Pool Noodle  - 1 x daily - 7 x weekly - 3 sets - 10 reps - Holding wall - hip abduction  - 1 x daily - 7 x weekly - 3 sets - 10 reps - Hip Swing  - 1 x daily - 7 x weekly - 3 sets - 10 reps - Standing Shoulder Horizontal Abduction With Dumbbells  - 1 x daily - 7 x weekly - 3 sets - 10 reps - Farmer's Walk  - 1 x daily - 7 x weekly - 3 sets - 10 reps - Step Up  - 1 x daily - 7 x weekly - 3 sets - 10 reps - Sit to Stand Without Arm Support  - 1 x daily - 7 x weekly - 3 sets - 10 reps - Standing 'L' Stretch at Counter  - 1 x daily - 7 x weekly - 3 sets - 10 reps - Hip Flexor Stretch at [employer]  - 1 x daily - 7 x weekly - 3 sets - 10 reps  ASSESSMENT:  CLINICAL IMPRESSION: Patient presents to aquatic session with knee pain and hip   7/10 pain and pt comments that she has reduced pain in the water but is discouraged about ongoing pain. Pt asking questions about whether a brace would help.  PT with medial to lateral force over left knee with decreased pain with STS. Pt may  continue taping and may try a knee pad with buttress to  decrease pain to encourage  [facility] exercises since she is doing more of exercises from the bed/sitting due to pain Session today focused on general strengthening  tasks in the aquatic environment for use of buoyancy to offload joints and the viscosity of water as resistance  during therapeutic exercise. Patient was able to tolerate all prescribed exercises in the aquatic environment with no adverse effects. Patient continues to benefit from skilled PT services on land and aquatic based and should be progressed as able to improve functional independence.   EVAL: Patient is a 53 y.o. F who was seen today for physical therapy evaluation and treatment for chronic bilateral knee pain with occasional R hip pain as well. Physical findings are consistent with referring provider impression as pt demonstrates decrease in quad and proximal hip strength and general functional mobility. LEFS score demonstrates severe disability in performance of home ADLs and higher level community activities. Pt would benefit from skilled PT services working on improving quad and hip strength in order to decrease knee pain.   OBJECTIVE IMPAIRMENTS: Abnormal gait, decreased activity tolerance, decreased endurance, decreased mobility, difficulty walking, decreased strength, and pain   ACTIVITY LIMITATIONS: carrying, lifting, sitting, standing, squatting, stairs, transfers, and locomotion level  PARTICIPATION LIMITATIONS: meal prep, cleaning, driving, shopping, community activity, occupation, and yard work  PERSONAL FACTORS: Time since onset of injury/illness/exacerbation and 1-2 comorbidities: HTN, see other PMH  are also affecting patient's functional outcome.   REHAB POTENTIAL: Good  CLINICAL DECISION MAKING: Stable/uncomplicated  EVALUATION COMPLEXITY: Low   GOALS: Goals reviewed with patient? No  SHORT TERM GOALS: Target date: 07/09/2023   Pt will be compliant and knowledgeable with initial HEP for improved comfort and  carryover Baseline: initial HEP given  Goal status: INITIAL  2.  Pt will self report bilateral knee pain no greater than 6/10 for improved comfort and functional ability Baseline: 10/10 at worst Goal status: INITIAL   LONG TERM GOALS: Target date: 08/13/2023   Pt will improve LEFS to no less than 40/80 as proxy for functional improvement with home ADLs and higher level community activity Baseline: 27/80 Goal status: INITIAL   2.  Pt will self report bilateral knee pain no greater than 3/10 for improved comfort and functional ability Baseline: 10/10 at worst Goal status: INITIAL   3.  Pt will increase 30 Second Sit to Stand rep count to no less than 8 reps for improved balance, strength, and functional mobility Baseline: 4 reps with UE  Goal status: INITIAL   4.  Pt will improve bilateral LE MMT to no less than 4/5 for all tested motions with decreased pain for improved functional mobility and stability to bilateral knees Baseline: see MMT chart Goal status: INITIAL   PLAN:  PT FREQUENCY: 1-2x/week  PT DURATION: 8 weeks  PLANNED INTERVENTIONS: 97164- PT Re-evaluation, 97110-Therapeutic exercises, 97530- Therapeutic activity, 97112- Neuromuscular re-education, 97535- Self Care, [id number]- Manual therapy, [id number]- Gait training, [phone]- Aquatic Therapy, [phone]- Electrical stimulation (unattended), [id number]- Electrical stimulation (manual), 97016- Vasopneumatic device, Cryotherapy, and Moist heat  PLAN FOR NEXT SESSION: assess HEP response, quad and hip strengthening in gym and pool   Graydon Dingwall, PT, [facility] Certified Exercise Expert for the Aging Adult  08/14/23 3:51 PM Phone: [phone] Fax: [phone]

## 2023-08-19 ENCOUNTER — Ambulatory Visit

## 2023-08-19 DIAGNOSIS — M25561 Pain in right knee: Secondary | ICD-10-CM | POA: Diagnosis not present

## 2023-08-19 DIAGNOSIS — R2689 Other abnormalities of gait and mobility: Secondary | ICD-10-CM

## 2023-08-19 DIAGNOSIS — M6281 Muscle weakness (generalized): Secondary | ICD-10-CM

## 2023-08-19 DIAGNOSIS — G8929 Other chronic pain: Secondary | ICD-10-CM

## 2023-08-19 NOTE — Therapy (Addendum)
 OUTPATIENT PHYSICAL THERAPY TREATMENT/DISCHARGE  PHYSICAL THERAPY DISCHARGE SUMMARY  Visits from Start of Care: 13  Current functional level related to goals / functional outcomes: See goals and objective   Remaining deficits: See goals and objective   Education / Equipment: HEP   Patient agrees to discharge. Patient goals were not met. Patient is being discharged due to maximized rehab potential.    Patient Name: Kristine Kelly MRN: [id number] DOB:11/05/1970, 53 y.o., female Today's Date: 08/19/2023  END OF SESSION:  PT End of Session - 08/19/23 1403     Visit Number 13    Number of Visits 17    Date for PT Re-Evaluation 08/13/23    Authorization Type [employer]    PT Start Time 1400    PT Stop Time 1440    PT Time Calculation (min) 40 min    Activity Tolerance Patient tolerated treatment well    Behavior During Therapy WFL for tasks assessed/performed                      Past Medical History:  Diagnosis Date   Anxiety    NO RECENT ANXIETY ATTACKS PER PT ON 10-04-2020   Arthritis    OA LEFT KNEE   Claustrophobia 10/04/2020   COVID-19 2020   COUGH SOB X 6 WEEKS ALL SYMPTOMS RESOLVED   GERD (gastroesophageal reflux disease)    Headache    Hypertension    not currently on medications   Wears glasses 10/04/2020   Past Surgical History:  Procedure Laterality Date   BREAST LUMPECTOMY Right 1991   BENIGN   CHOLECYSTECTOMY  2006   LAPAROSCOPIC   DILATATION AND CURETTAGE/HYSTEROSCOPY WITH MINERVA N/A 07/30/2018   Procedure: DILATATION AND CURETTAGE/HYSTEROSCOPY WITH MINERVA;  Surgeon: Starla Harland BROCKS, MD;  Location: [facility];  Service: Gynecology;  Laterality: N/A;   TUBAL LIGATION     21 YRS AGO PER PT ON 10-04-2020   VAGINAL HYSTERECTOMY  10/11/2020   Procedure: TOTAL HYSTERECTOMY VAGINAL with RIGHT SALPINGECTOMY;  Surgeon: Lorence Ozell CROME, MD;  Location: [facility];  Service: Gynecology;;   Patient Active Problem List    Diagnosis Date Noted   Bilateral chronic knee pain 04/29/2023   Osteoarthritis, multiple sites 11/12/2022   Pain in right hip 11/12/2022   Numbness and tingling 11/12/2022   Post-operative state 11/21/2020   H/O vaginal hysterectomy 11/21/2020   Vaginal discharge 10/05/2020   Menopausal symptoms 09/01/2020   Dermatophytosis 05/25/2020   Obesity 04/28/2018   Anxiety and depression 10/04/2016    PCP: [name], NP  REFERRING PROVIDER: Persons, [name], PA   REFERRING DIAG: M25.561,M25.562,G89.29 (ICD-10-CM) - Bilateral chronic knee pain   THERAPY DIAG:  Chronic pain of both knees  Muscle weakness (generalized)  Other abnormalities of gait and mobility  Rationale for Evaluation and Treatment: Rehabilitation  ONSET DATE: Chronic  SUBJECTIVE:   SUBJECTIVE STATEMENT: Pt presents to PT with continued reports of severe knee pain. Has been fairly compliant with HEP, notes work continues to really increase pain.   EVAL: Pt is well known to therapist and presents to PT with reports of chronic bilateral knee and R hip pain. Also has pain in multiple bilateral joint sites, saw rheumatology last year but never heard back about results of labs. 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 Denies N/T or any paresthesias down LE.   PERTINENT HISTORY: HTN, see other PMH  PRECAUTIONS: None  RED FLAGS: None   WEIGHT BEARING RESTRICTIONS: No  FALLS:  Has patient fallen in last 6 months? Yes. Number of falls - 1  LIVING ENVIRONMENT: Lives with: lives with their family Lives in: House/apartment Stairs: Yes: External: 13 steps; bilateral but cannot reach both Has following equipment at home: None  OCCUPATION: Product manager   PLOF: Independent  PATIENT GOALS: decrease knee pain, improve functional mobility with stairs and work  NEXT MD VISIT: 07/05/2023  OBJECTIVE:  Note: Objective  measures were completed at Evaluation unless otherwise noted.  DIAGNOSTIC FINDINGS: See imaging  PATIENT SURVEYS:  LEFS: 27/80  COGNITION: Overall cognitive status: Within functional limits for tasks assessed     SENSATION: WFL  POSTURE: rounded shoulders, forward head, and knee vaglus  PALPATION: TTP to bilateral patellar tendon, bilateral patellar hypomobility  LOWER EXTREMITY ROM:  Active ROM Right eval Left eval  Hip flexion    Hip extension    Hip abduction    Hip adduction    Hip internal rotation    Hip external rotation    Knee flexion 115 125  Knee extension 0 0  Ankle dorsiflexion    Ankle plantarflexion    Ankle inversion    Ankle eversion     (Blank rows = not tested)  LOWER EXTREMITY MMT:  MMT Right eval Left eval Right 7/28 Left 7/28  Hip flexion 4 4 4 4   Hip extension      Hip abduction 3 3 3+ 3+  Hip adduction      Hip internal rotation      Hip external rotation      Knee flexion 4 4    Knee extension 4 p! 4    Ankle dorsiflexion      Ankle plantarflexion      Ankle inversion      Ankle eversion       (Blank rows = not tested)  LOWER EXTREMITY SPECIAL TESTS:  Knee special tests: Posterior drawer test: negative and Lachman Test: negative  FUNCTIONAL TESTS:  30 Second Sit to Stand: 4 reps with UE  GAIT: Distance walked: 53ft Assistive device utilized: None Level of assistance: Complete Independence Comments: antalgic gait, decreased gait speed   TREATMENT: [facility] Adult PT Treatment:                                                DATE: 08/19/2023 NuStep lvl 4 UE/LE x 5 min for functional activity tolerance Assessment of tests/measures, goals, and outcomes Supine QS x 10 - 5 hold Supine SLR 2x15 ea w/ QS Bridge with green band 2x10 S/L clamshell 2x10 GTB S/L hip abd 2x10 ea LAQ 2x10 ea  PATIENT EDUCATION:  Education details: continue HEP Person educated: Patient Education method: Programmer, multimedia, Facilities manager, and  Handouts Education comprehension: verbalized understanding and returned demonstration  HOME EXERCISE PROGRAM: Access Code: [email or web address] URL: [email or web address] Date: 08/19/2023 Prepared by: Alm Kingdom  Exercises - Supine Quadricep Sets  - 3-4 x weekly - 2 sets - 10 reps - 5 sec hold - Active Straight Leg Raise with Quad Set  - 3-4 x weekly - 2-3 sets - 15 reps - Supine Bridge with Resistance Band  - 3-4 x weekly - 3 sets - 10 reps - green band hold - Clamshell with Resistance  - 3-4 x weekly - 3 sets - 10  reps - green band hold - Sidelying Hip Abduction  - 3-4 x weekly - 2 sets - 5 reps - Seated Long Arc Quad  - 3-4 x weekly - 3 sets - 10 reps - with quad squeeze hold  Aquatic HEP Access Code: [email or web address] URL: [email or web address] Date: 08/06/2023 Prepared by: Graydon Dingwall  Exercises - Cat Cow in [place] with Pool Noodle  - 1 x daily - 1-3 x weekly - 1 sets - 5-10 reps - Full Triangle Pose in [place] with Pool Noodle  - 1 x daily - 1-3 x weekly - 1 sets - 5 reps - 15-30 hold - Lunge to [employer] at [employer]  - 1 x daily - 1-3 x weekly - 2 sets - 10 reps - 3 -way Leg Stretch with Pool Noodle  - 1 x daily - 7 x weekly - 3 sets - 10 reps - Holding wall - hip abduction  - 1 x daily - 7 x weekly - 3 sets - 10 reps - Hip Swing  - 1 x daily - 7 x weekly - 3 sets - 10 reps - Standing Shoulder Horizontal Abduction With Dumbbells  - 1 x daily - 7 x weekly - 3 sets - 10 reps - Farmer's Walk  - 1 x daily - 7 x weekly - 3 sets - 10 reps - Step Up  - 1 x daily - 7 x weekly - 3 sets - 10 reps - Sit to Stand Without Arm Support  - 1 x daily - 7 x weekly - 3 sets - 10 reps - Standing 'L' Stretch at Counter  - 1 x daily - 7 x weekly - 3 sets - 10 reps - Hip Flexor Stretch at [employer]  - 1 x daily - 7 x weekly - 3 sets - 10 reps  ASSESSMENT:  CLINICAL IMPRESSION: Pt was able to complete prescribed exercises and demonstrated knowledge of HEP. Over the  course of PT treatment she has not been able to progress functional mobility or decrease knee pain. LEFS score actually shows decreased function compared to evaluation. At this point she has probably maximized current rehab potential, but she could continue to improve with HEP compliance at home focusing on strength. She will have her last pool session on 7/30 to finalize aquatic HEP then discharge at that time.   OBJECTIVE IMPAIRMENTS: Abnormal gait, decreased activity tolerance, decreased endurance, decreased mobility, difficulty walking, decreased strength, and pain   ACTIVITY LIMITATIONS: carrying, lifting, sitting, standing, squatting, stairs, transfers, and locomotion level  PARTICIPATION LIMITATIONS: meal prep, cleaning, driving, shopping, community activity, occupation, and yard work  PERSONAL FACTORS: Time since onset of injury/illness/exacerbation and 1-2 comorbidities: HTN, see other PMH  are also affecting patient's functional outcome.   REHAB POTENTIAL: Good  CLINICAL DECISION MAKING: Stable/uncomplicated  EVALUATION COMPLEXITY: Low   GOALS: Goals reviewed with patient? No  SHORT TERM GOALS: Target date: 07/09/2023   Pt will be compliant and knowledgeable with initial HEP for improved comfort and carryover Baseline: initial HEP given  Goal status: MET  2.  Pt will self report bilateral knee pain no greater than 6/10 for improved comfort and functional ability Baseline: 10/10 at worst Goal status: PARTIALLY MET   LONG TERM GOALS: Target date: 08/13/2023   Pt will improve LEFS to no less than 40/80 as proxy for functional improvement with home ADLs and higher level community activity Baseline: 27/80 08/19/23: 18/80 Goal status: NOT MET   2.  Pt will  self report bilateral knee pain no greater than 3/10 for improved comfort and functional ability Baseline: 10/10 at worst 08/19/2023: 8/10 Goal status: NOT MET   3.  Pt will increase 30 Second Sit to Stand rep count to no  less than 8 reps for improved balance, strength, and functional mobility Baseline: 4 reps with UE  08/19/23: 5 reps with UE Goal status: NOT MET   4.  Pt will improve bilateral LE MMT to no less than 4/5 for all tested motions with decreased pain for improved functional mobility and stability to bilateral knees Baseline: see MMT chart Goal status: NOT MET   PLAN:  PT FREQUENCY: 1-2x/week  PT DURATION: 8 weeks  PLANNED INTERVENTIONS: 97164- PT Re-evaluation, 97110-Therapeutic exercises, 97530- Therapeutic activity, [id number]- Neuromuscular re-education, 97535- Self Care, [id number]- Manual therapy, [id number]- Gait training, [phone]- Aquatic Therapy, H9716- Electrical stimulation (unattended), [id number]- Electrical stimulation (manual), 97016- Vasopneumatic device, Cryotherapy, and Moist heat  PLAN FOR NEXT SESSION: assess HEP response, quad and hip strengthening in gym and pool   Alm JAYSON Kingdom PT  08/19/23 2:45 PM

## 2023-08-21 ENCOUNTER — Ambulatory Visit: Admitting: Physical Therapy

## 2023-09-12 ENCOUNTER — Telehealth: Payer: Self-pay | Admitting: Physician Assistant

## 2023-09-12 NOTE — Telephone Encounter (Signed)
 Called pt 1X and left vm to reschedule with Ronal Caldron. Please transfer call to [place] when pt calls to reschedule

## 2023-09-19 ENCOUNTER — Ambulatory Visit: Admitting: Physician Assistant

## 2023-09-24 ENCOUNTER — Institutional Professional Consult (permissible substitution) (INDEPENDENT_AMBULATORY_CARE_PROVIDER_SITE_OTHER): Payer: Self-pay | Admitting: Physician Assistant

## 2023-09-26 ENCOUNTER — Ambulatory Visit (INDEPENDENT_AMBULATORY_CARE_PROVIDER_SITE_OTHER): Admitting: Physician Assistant

## 2023-09-26 ENCOUNTER — Other Ambulatory Visit: Payer: Self-pay

## 2023-09-26 ENCOUNTER — Encounter: Payer: Self-pay | Admitting: Physician Assistant

## 2023-09-26 DIAGNOSIS — M25562 Pain in left knee: Secondary | ICD-10-CM

## 2023-09-26 DIAGNOSIS — G8929 Other chronic pain: Secondary | ICD-10-CM | POA: Diagnosis not present

## 2023-09-26 MED ORDER — LIDOCAINE HCL 1 % IJ SOLN
4.0000 mL | INTRAMUSCULAR | Status: AC | PRN
  Administered 2023-09-26: 4 mL

## 2023-09-26 MED ORDER — METHYLPREDNISOLONE ACETATE 40 MG/ML IJ SUSP
40.0000 mg | INTRAMUSCULAR | Status: AC | PRN
Start: 2023-09-26 — End: 2023-09-26
  Administered 2023-09-26: 40 mg via INTRA_ARTICULAR

## 2023-09-26 NOTE — Progress Notes (Signed)
 Office Visit Note   Patient: Kristine Kelly           Date of Birth: 09-22-1970           MRN: [id number] Visit Date: 09/26/2023              Requested by: [name], NP [address] [place],  [place] [address] PCP: [name], NP  Chief Complaint  Patient presents with  . Left Knee - Pain    Patient fell out the shower 3 weeks ago and twisted her knee  [facility]        HPI: Patient is a pleasant 53 year old woman who have seen in the past for left knee arthritis.  3 weeks ago she slipped in the shower and twisted her left knee.  Since then she has had pain in the knee and is using a crutch.  Occasionally gets some locking and catching.SABRA  Has been taking Tylenol  arthritis.  Cannot really take anti-inflammatories as advised by her primary care provider because of her blood pressure  Assessment & Plan: Visit Diagnoses:  1. Chronic pain of left knee     Plan: Left knee pain I did aspirate her today and got about 10 cc of clear fluid.  I did also inject her with some steroid would like to check her back in a couple weeks.  Will provide her with a brace per her request  Follow-Up Instructions: No follow-ups on file.   Ortho Exam  Patient is alert, oriented, no adenopathy, well-dressed, normal affect, normal respiratory effort. Examination of her left knee she has a mild to moderate effusion no erythema compartments are soft and compressible she is tender over the joint line.  She Is able to sustain extension and flexion.  No cellulitis    Imaging: No results found. No images are attached to the encounter.  Labs: Lab Results  Component Value Date   HGBA1C 5.6 10/18/2021   HGBA1C 5.7 (H) 05/30/2020   HGBA1C CANCELED 05/25/2020   ESRSEDRATE 33 (H) 11/13/2022     Lab Results  Component Value Date   ALBUMIN 4.3 08/22/2020   ALBUMIN 4.0 03/26/2020   ALBUMIN 4.1 12/18/2019    No results found for: MG No results found for:  VD25OH  No results found for: PREALBUMIN    Latest Ref Rng & Units 10/11/2020   11:14 PM 10/07/2020    3:02 PM 08/22/2020    7:53 AM  CBC EXTENDED  WBC 4.0 - 10.5 K/uL 11.6  7.6  6.7   RBC 3.87 - 5.11 MIL/uL 4.03  4.46  4.85   Hemoglobin 12.0 - 15.0 g/dL 88.0  86.8  85.7   HCT 36.0 - 46.0 % 35.3  40.0  42.6   Platelets 150 - 400 K/uL 198  218  231   NEUT# 1.7 - 7.7 K/uL   5.0   Lymph# 0.7 - 4.0 K/uL   1.2      There is no height or weight on file to calculate BMI.  Orders:  Orders Placed This Encounter  Procedures  . XR Knee 1-2 Views Left   No orders of the defined types were placed in this encounter.    Procedures: Large Joint Inj on 09/26/2023 2:31 PM Indications: pain and diagnostic evaluation Details: 25 G 1.5 in needle, superolateral approach  Arthrogram: No  Medications: 40 mg methylPREDNISolone  acetate 40 MG/ML; 4 mL lidocaine  1 % Outcome: tolerated well, no  immediate complications  After obtaining verbal consent 4 cc of lidocaine  plain was injected into the superior lateral pouch after sterilely prepping with Betadine  and alcohol.  After adequate analgesia 10 cc of clear fluid was aspirated.  No hemarthrosis.  40 mg of prednisone  methylprednisolone  was injected Ace wrap applied Procedure, treatment alternatives, risks and benefits explained, specific risks discussed. Consent was given by the patient.      Clinical Data: No additional findings.  ROS:  All other systems negative, except as noted in the HPI. Review of Systems  Objective: Vital Signs: LMP 09/22/2020   Specialty Comments:  Tobie Julaine Moons, MD - 12/24/2022  Formatting of this note might be different from the original.  CLINICAL DATA:  Back pain, T12 compression fracture.   EXAM:  MRI THORACIC SPINE WITHOUT CONTRAST   TECHNIQUE:  Multiplanar, multisequence MR imaging of the thoracic spine was  performed. No intravenous contrast was administered.   COMPARISON:  None Available.    FINDINGS:  Alignment: Physiologic.   Vertebrae: No acute fracture, evidence of discitis, or aggressive  bone lesion. Small hemangioma in the T5 vertebral body.   Cord:  Normal signal and morphology.   Paraspinal and other soft tissues: No acute paraspinal abnormality.   Disc levels:   Disc spaces:  Disc spaces are maintained.   Degenerative disease with mild disc height loss at T5-6, T6-7, T7-8  and T10-11.   T1-T2: No disc protrusion, foraminal stenosis or central canal  stenosis.   T2-T3: No disc protrusion, foraminal stenosis or central canal  stenosis.   T3-T4: No disc protrusion, foraminal stenosis or central canal  stenosis.   T4-T5: No disc protrusion, foraminal stenosis or central canal  stenosis.   T5-T6: No disc protrusion, foraminal stenosis or central canal  stenosis.   T6-T7: Moderate right paracentral disc protrusion with inferior  migration of disc material contacting the ventral cervical spinal  cord. No foraminal or central canal stenosis.   T7-T8: No disc protrusion, foraminal stenosis or central canal  stenosis.   T8-T9: Small central disc protrusion. No foraminal or central canal  stenosis.   T9-T10: No disc protrusion, foraminal stenosis or central canal  stenosis.   T10-T11: No disc protrusion, foraminal stenosis or central canal  stenosis.   T11-T12: No disc protrusion, foraminal stenosis or central canal  stenosis.   IMPRESSION:  1. No acute osseous injury of the thoracic spine.  2. At T6-7 there is a moderate right paracentral disc protrusion  with inferior migration of disc material contacting the ventral  cervical spinal cord.  3. At T8-9 there is a small central disc protrusion.  4. No foraminal or central canal stenosis of the thoracic spine.    Electronically Signed    By: Julaine Tobie M.D.    On: 12/24/2022 14:04  PMFS History: Patient Active Problem List   Diagnosis Date Noted  . Bilateral chronic knee pain  04/29/2023  . Osteoarthritis, multiple sites 11/12/2022  . Pain in right hip 11/12/2022  . Numbness and tingling 11/12/2022  . Post-operative state 11/21/2020  . H/O vaginal hysterectomy 11/21/2020  . Vaginal discharge 10/05/2020  . Menopausal symptoms 09/01/2020  . Dermatophytosis 05/25/2020  . Obesity 04/28/2018  . Anxiety and depression 10/04/2016   Past Medical History:  Diagnosis Date  . Anxiety    NO RECENT ANXIETY ATTACKS PER PT ON 10-04-2020  . Arthritis    OA LEFT KNEE  . Claustrophobia 10/04/2020  . COVID-19 2020   COUGH SOB X 6  WEEKS ALL SYMPTOMS RESOLVED  . GERD (gastroesophageal reflux disease)   . Headache   . Hypertension    not currently on medications  . Wears glasses 10/04/2020    Family History  Problem Relation Age of Onset  . Asthma Mother     Past Surgical History:  Procedure Laterality Date  . BREAST LUMPECTOMY Right 1991   BENIGN  . CHOLECYSTECTOMY  2006   LAPAROSCOPIC  . DILATATION AND CURETTAGE/HYSTEROSCOPY WITH MINERVA N/A 07/30/2018   Procedure: DILATATION AND CURETTAGE/HYSTEROSCOPY WITH MINERVA;  Surgeon: Starla Harland BROCKS, MD;  Location: [facility];  Service: Gynecology;  Laterality: N/A;  . TUBAL LIGATION     21 YRS AGO PER PT ON 10-04-2020  . VAGINAL HYSTERECTOMY  10/11/2020   Procedure: TOTAL HYSTERECTOMY VAGINAL with RIGHT SALPINGECTOMY;  Surgeon: Lorence Ozell CROME, MD;  Location: [facility];  Service: Gynecology;;   Social History   Occupational History  . Not on file  Tobacco Use  . Smoking status: Never    Passive exposure: Never  . Smokeless tobacco: Never  Vaping Use  . Vaping status: Never Used  Substance and Sexual Activity  . Alcohol use: No  . Drug use: No  . Sexual activity: Yes    Birth control/protection: Surgical

## 2023-10-11 ENCOUNTER — Ambulatory Visit: Admitting: Physician Assistant

## 2023-10-11 ENCOUNTER — Encounter: Payer: Self-pay | Admitting: Physician Assistant

## 2023-10-11 DIAGNOSIS — G8929 Other chronic pain: Secondary | ICD-10-CM

## 2023-10-11 DIAGNOSIS — M25562 Pain in left knee: Secondary | ICD-10-CM | POA: Diagnosis not present

## 2023-10-11 NOTE — Progress Notes (Signed)
 Office Visit Note   Patient: Kristine Kelly           Date of Birth: 1970/05/05           MRN: [id number] Visit Date: 10/11/2023              Requested by: [name], NP [address] [place],  [place] [address] PCP: [name], NP  Chief Complaint  Patient presents with   Left Knee - Pain      HPI: Patient is a pleasant 53 year old woman who I have been seeing after a twisting injury to her left knee.  I did aspirate fluid and gave her steroid injection 2 weeks ago.  She thinks this has helped her minimally.  She is not using a crutch but she complains of most pain in the back of the knee she denies any calf pain any fever chills  Assessment & Plan: Visit Diagnoses:  1. Chronic pain of left knee     Plan: She does have some mechanical symptoms of increased pain with terminal flexion extension she has some locking and catching symptoms she would however prefer to try physical therapy first.  Will order PT today can contact me if this is ineffective would order an MRI  Follow-Up Instructions: Return if symptoms worsen or fail to improve.   Ortho Exam  Patient is alert, oriented, no adenopathy, well-dressed, normal affect, normal respiratory effort. Left knee no effusion no erythema compartments are soft and compressible no calf pain negative Homans' sign.  She has pain in the posterior medial and lateral aspect of her knee.  She has good strength with extension and flexion.  She has pain with terminal extension and flexion.  Good stability with anterior draw good medial and lateral collateral ligament stability    Imaging: No results found. No images are attached to the encounter.  Labs: Lab Results  Component Value Date   HGBA1C 5.6 10/18/2021   HGBA1C 5.7 (H) 05/30/2020   HGBA1C CANCELED 05/25/2020   ESRSEDRATE 33 (H) 11/13/2022     Lab Results  Component Value Date   ALBUMIN 4.3 08/22/2020   ALBUMIN 4.0 03/26/2020   ALBUMIN 4.1 12/18/2019     No results found for: MG No results found for: VD25OH  No results found for: PREALBUMIN    Latest Ref Rng & Units 10/11/2020   11:14 PM 10/07/2020    3:02 PM 08/22/2020    7:53 AM  CBC EXTENDED  WBC 4.0 - 10.5 K/uL 11.6  7.6  6.7   RBC 3.87 - 5.11 MIL/uL 4.03  4.46  4.85   Hemoglobin 12.0 - 15.0 g/dL 88.0  86.8  85.7   HCT 36.0 - 46.0 % 35.3  40.0  42.6   Platelets 150 - 400 K/uL 198  218  231   NEUT# 1.7 - 7.7 K/uL   5.0   Lymph# 0.7 - 4.0 K/uL   1.2      There is no height or weight on file to calculate BMI.  Orders:  Orders Placed This Encounter  Procedures   Ambulatory referral to Physical Therapy   No orders of the defined types were placed in this encounter.    Procedures: No procedures performed  Clinical Data: No additional findings.  ROS:  All other systems negative, except as noted in the HPI. Review of Systems  Objective: Vital Signs: LMP 09/22/2020   Specialty Comments:  Tobie Julaine Moons, MD - 12/24/2022  Formatting  of this note might be different from the original.  CLINICAL DATA:  Back pain, T12 compression fracture.   EXAM:  MRI THORACIC SPINE WITHOUT CONTRAST   TECHNIQUE:  Multiplanar, multisequence MR imaging of the thoracic spine was  performed. No intravenous contrast was administered.   COMPARISON:  None Available.   FINDINGS:  Alignment: Physiologic.   Vertebrae: No acute fracture, evidence of discitis, or aggressive  bone lesion. Small hemangioma in the T5 vertebral body.   Cord:  Normal signal and morphology.   Paraspinal and other soft tissues: No acute paraspinal abnormality.   Disc levels:   Disc spaces:  Disc spaces are maintained.   Degenerative disease with mild disc height loss at T5-6, T6-7, T7-8  and T10-11.   T1-T2: No disc protrusion, foraminal stenosis or central canal  stenosis.   T2-T3: No disc protrusion, foraminal stenosis or central canal  stenosis.   T3-T4: No disc protrusion,  foraminal stenosis or central canal  stenosis.   T4-T5: No disc protrusion, foraminal stenosis or central canal  stenosis.   T5-T6: No disc protrusion, foraminal stenosis or central canal  stenosis.   T6-T7: Moderate right paracentral disc protrusion with inferior  migration of disc material contacting the ventral cervical spinal  cord. No foraminal or central canal stenosis.   T7-T8: No disc protrusion, foraminal stenosis or central canal  stenosis.   T8-T9: Small central disc protrusion. No foraminal or central canal  stenosis.   T9-T10: No disc protrusion, foraminal stenosis or central canal  stenosis.   T10-T11: No disc protrusion, foraminal stenosis or central canal  stenosis.   T11-T12: No disc protrusion, foraminal stenosis or central canal  stenosis.   IMPRESSION:  1. No acute osseous injury of the thoracic spine.  2. At T6-7 there is a moderate right paracentral disc protrusion  with inferior migration of disc material contacting the ventral  cervical spinal cord.  3. At T8-9 there is a small central disc protrusion.  4. No foraminal or central canal stenosis of the thoracic spine.    Electronically Signed    By: Julaine Blanch M.D.    On: 12/24/2022 14:04  PMFS History: Patient Active Problem List   Diagnosis Date Noted   Pain in left knee 04/29/2023   Osteoarthritis, multiple sites 11/12/2022   Pain in right hip 11/12/2022   Numbness and tingling 11/12/2022   Post-operative state 11/21/2020   H/O vaginal hysterectomy 11/21/2020   Vaginal discharge 10/05/2020   Menopausal symptoms 09/01/2020   Dermatophytosis 05/25/2020   Obesity 04/28/2018   Anxiety and depression 10/04/2016   Past Medical History:  Diagnosis Date   Anxiety    NO RECENT ANXIETY ATTACKS PER PT ON 10-04-2020   Arthritis    OA LEFT KNEE   Claustrophobia 10/04/2020   COVID-19 2020   COUGH SOB X 6 WEEKS ALL SYMPTOMS RESOLVED   GERD (gastroesophageal reflux disease)    Headache     Hypertension    not currently on medications   Wears glasses 10/04/2020    Family History  Problem Relation Age of Onset   Asthma Mother     Past Surgical History:  Procedure Laterality Date   BREAST LUMPECTOMY Right 1991   BENIGN   CHOLECYSTECTOMY  2006   LAPAROSCOPIC   DILATATION AND CURETTAGE/HYSTEROSCOPY WITH MINERVA N/A 07/30/2018   Procedure: DILATATION AND CURETTAGE/HYSTEROSCOPY WITH MINERVA;  Surgeon: Starla Harland BROCKS, MD;  Location: [facility];  Service: Gynecology;  Laterality: N/A;   TUBAL LIGATION  21 YRS AGO PER PT ON 10-04-2020   VAGINAL HYSTERECTOMY  10/11/2020   Procedure: TOTAL HYSTERECTOMY VAGINAL with RIGHT SALPINGECTOMY;  Surgeon: Lorence Ozell CROME, MD;  Location: [facility];  Service: Gynecology;;   Social History   Occupational History   Not on file  Tobacco Use   Smoking status: Never    Passive exposure: Never   Smokeless tobacco: Never  Vaping Use   Vaping status: Never Used  Substance and Sexual Activity   Alcohol use: No   Drug use: No   Sexual activity: Yes    Birth control/protection: Surgical

## 2023-10-28 NOTE — Therapy (Signed)
 OUTPATIENT PHYSICAL THERAPY LOWER EXTREMITY EVALUATION   Patient Name: Kristine Kelly MRN: [id number] DOB:1970-04-15, 53 y.o., female Today's Date: 10/30/2023  END OF SESSION:  PT End of Session - 10/30/23 0901     Visit Number 1    Number of Visits 17    Date for Recertification  12/25/23    Authorization Type [employer]    PT Start Time 1447    PT Stop Time 1523    PT Time Calculation (min) 36 min    Activity Tolerance Patient tolerated treatment well    Behavior During Therapy WFL for tasks assessed/performed          Past Medical History:  Diagnosis Date   Anxiety    NO RECENT ANXIETY ATTACKS PER PT ON 10-04-2020   Arthritis    OA LEFT KNEE   Claustrophobia 10/04/2020   COVID-19 2020   COUGH SOB X 6 WEEKS ALL SYMPTOMS RESOLVED   GERD (gastroesophageal reflux disease)    Headache    Hypertension    not currently on medications   Wears glasses 10/04/2020   Past Surgical History:  Procedure Laterality Date   BREAST LUMPECTOMY Right 1991   BENIGN   CHOLECYSTECTOMY  2006   LAPAROSCOPIC   DILATATION AND CURETTAGE/HYSTEROSCOPY WITH MINERVA N/A 07/30/2018   Procedure: DILATATION AND CURETTAGE/HYSTEROSCOPY WITH MINERVA;  Surgeon: Starla Harland BROCKS, MD;  Location: [facility];  Service: Gynecology;  Laterality: N/A;   TUBAL LIGATION     21 YRS AGO PER PT ON 10-04-2020   VAGINAL HYSTERECTOMY  10/11/2020   Procedure: TOTAL HYSTERECTOMY VAGINAL with RIGHT SALPINGECTOMY;  Surgeon: Lorence Ozell CROME, MD;  Location: [facility];  Service: Gynecology;;   Patient Active Problem List   Diagnosis Date Noted   Pain in left knee 04/29/2023   Osteoarthritis, multiple sites 11/12/2022   Pain in right hip 11/12/2022   Numbness and tingling 11/12/2022   Post-operative state 11/21/2020   H/O vaginal hysterectomy 11/21/2020   Vaginal discharge 10/05/2020   Menopausal symptoms 09/01/2020   Dermatophytosis 05/25/2020   Obesity 04/28/2018   Anxiety and depression  10/04/2016    PCP: [name], NP  REFERRING PROVIDER: Persons, [name], PA   REFERRING DIAG: [phone] (ICD-10-CM) - Chronic pain of left knee   THERAPY DIAG:  Chronic pain of left knee  Muscle weakness (generalized)  Other abnormalities of gait and mobility  Rationale for Evaluation and Treatment: Rehabilitation  ONSET DATE: Chronic  SUBJECTIVE:   SUBJECTIVE STATEMENT: Pt presents to PT with reports of L knee pain after twisting injury while getting out of shower about 6 weeks ago. Notes that pain has slightly lowered after getting knee draining and cortisone injection last week. Pain is mainly in posterior L knee and calf. Denies pop or clunk sensation during injury, does have L knee buckling when walking.   PERTINENT HISTORY: HTN, see other PMH   PAIN:  Are you having pain?  Yes: NPRS scale: 8/10 Worst: 10/10 Pain location: L posterolateral knee Pain description: sharp, sore Aggravating factors: walking, stairs, transfers, work Relieving factors: none  PRECAUTIONS: None  RED FLAGS: None   WEIGHT BEARING RESTRICTIONS: No  FALLS:  Has patient fallen in last 6 months? Yes. Number of falls two  LIVING ENVIRONMENT: Lives with: lives with their family Lives in: House/apartment Stairs: Yes: External: 13 steps; bilateral but cannot reach both Has following equipment at home: None  OCCUPATION: Product manager   PLOF: Independent  PATIENT GOALS: decrease  knee pain with work and daily activities  OBJECTIVE:  Note: Objective measures were completed at Evaluation unless otherwise noted.  DIAGNOSTIC FINDINGS: See imaging   PATIENT SURVEYS:  LEFS: 22/80  COGNITION: Overall cognitive status: Within functional limits for tasks assessed     SENSATION: WFL  POSTURE: rounded shoulders and forward head  PALPATION: TTP to L lateral posterior knee, L lateral gastroc  LOWER EXTREMITY ROM:  Active ROM Right eval Left eval  Knee  flexion 122 85  Knee extension 0 0   (Blank rows = not tested)  LOWER EXTREMITY MMT:  MMT Right eval Left eval  Hip flexion 4 3+  Hip extension    Hip abduction 3+ 3+  Hip adduction    Hip internal rotation    Hip external rotation    Knee flexion 4 3  Knee extension 4 3  Ankle dorsiflexion    Ankle plantarflexion    Ankle inversion    Ankle eversion     (Blank rows = not tested)  LOWER EXTREMITY SPECIAL TESTS:  Knee special tests: Posterior drawer test: negative and Lachman Test: negative  FUNCTIONAL TESTS:  30 Second Sit to Stand: 5 reps with UE  GAIT: Distance walked: 65ft Assistive device utilized: None Level of assistance: Complete Independence Comments: antalgic gait R    TREATMENT: [facility] Adult PT Treatment:                                                DATE: 10/29/2023 Therapeutic Exercise: Supine heel slide x 5 - 5 hold with strap Long sitting quad x 5 - 5 hold Calf TPR with tennis ball x 60 Standing gastroc stretch x 30 L  PATIENT EDUCATION:  Education details: eval findings, LEFS, HEP, POC Person educated: Patient Education method: Explanation, Demonstration, and Handouts Education comprehension: verbalized understanding and returned demonstration  HOME EXERCISE PROGRAM: Access Code: [email or web address] URL: [email or web address] Date: 10/29/2023 Prepared by: Alm Kingdom  Exercises - Supine Heel Slide with Strap  - 1 x daily - 7 x weekly - 2 sets - 10 reps - 5 sec hold - Long Sitting Quad Set  - 1 x daily - 7 x weekly - 2 sets - 10 reps - 5 sec hold - Calf Mobilization with Small Ball  - 1 x daily - 7 x weekly - 2-3 min hold - Gastroc Stretch on Wall  - 1 x daily - 7 x weekly - 2-3 reps - 30 sec hold  ASSESSMENT:  CLINICAL IMPRESSION: Patient is a 53 y.o. F who was seen today for physical therapy evaluation and treatment for acute on chronic L knee pain. Physical findings are consistent with referring provider impression as pt  demonstrates decrease in L knee ROM and strength. LEFS shows severe disability in performance of home ADLs and higher level activities. Pt would benefit from skilled PT services working on improving L knee ROM and strength in order to decrease pain and improve comfort.   OBJECTIVE IMPAIRMENTS: Abnormal gait, decreased activity tolerance, decreased mobility, difficulty walking, decreased ROM, decreased strength, and pain   ACTIVITY LIMITATIONS: sitting, standing, squatting, sleeping, stairs, transfers, and locomotion level  PARTICIPATION LIMITATIONS: meal prep, cleaning, driving, shopping, community activity, occupation, and yard work  PERSONAL FACTORS: Time since onset of injury/illness/exacerbation and 1-2 comorbidities: HTN are also affecting patient's functional outcome.   REHAB POTENTIAL: Good  CLINICAL  DECISION MAKING: Evolving/moderate complexity  EVALUATION COMPLEXITY: Moderate   GOALS: Goals reviewed with patient? No  SHORT TERM GOALS: Target date: 11/19/2023   Pt will be compliant and knowledgeable with initial HEP for improved comfort and carryover Baseline: initial HEP given  Goal status: INITIAL  2.  Pt will self report left knee pain no greater than 6/10 for improved comfort and functional ability Baseline: 10/10 at worst Goal status: INITIAL   LONG TERM GOALS: Target date: 12/25/2023   Pt will improve LEFS to no less than 35/80 as proxy for functional improvement with home ADLs and higher level community activity Baseline: 22/80 Goal status: INITIAL   2.  Pt will self report left knee pain no greater than 4/10 for improved comfort and functional ability Baseline: 10/10 at worst Goal status: INITIAL   3.  Pt will increase 30 Second Sit to Stand rep count to no less than 10 reps for improved balance, strength, and functional mobility Baseline: 5 reps  Goal status: INITIAL   4.  Pt will improve L knee flexion AROM to at least 120 degrees for improved comfort and  functional mobility Baseline: 85 degrees Goal status: INITIAL  5.  Pt will improve L LE MMT to at least 4/5 for improved comfort and functional mobility while decreasing knee pain Baseline: see chart Goal status: INITIAL   PLAN:  PT FREQUENCY: 1-2x/week  PT DURATION: 8 weeks  PLANNED INTERVENTIONS: [id number]- PT Re-evaluation, 97110-Therapeutic exercises, 97530- Therapeutic activity, 97112- Neuromuscular re-education, 97535- Self Care, [id number]- Manual therapy, [id number]- Gait training, [phone]- Aquatic Therapy, [phone]- Electrical stimulation (unattended), [id number]- Electrical stimulation (manual), [id number]- Vasopneumatic device, 20560 (1-2 muscles), [id number] (3+ muscles)- Dry Needling, Patient/Family education, Cryotherapy, and Moist heat  PLAN FOR NEXT SESSION: assess HEP response, knee ROM, LE strengthening    Alm JAYSON Kingdom, PT 10/30/2023, 9:18 AM

## 2023-10-29 ENCOUNTER — Ambulatory Visit: Attending: Physician Assistant

## 2023-10-29 DIAGNOSIS — G8929 Other chronic pain: Secondary | ICD-10-CM | POA: Insufficient documentation

## 2023-10-29 DIAGNOSIS — M25562 Pain in left knee: Secondary | ICD-10-CM | POA: Diagnosis present

## 2023-10-29 DIAGNOSIS — M6281 Muscle weakness (generalized): Secondary | ICD-10-CM | POA: Diagnosis present

## 2023-10-29 DIAGNOSIS — R2689 Other abnormalities of gait and mobility: Secondary | ICD-10-CM | POA: Diagnosis present

## 2023-11-11 ENCOUNTER — Ambulatory Visit

## 2023-11-11 DIAGNOSIS — M6281 Muscle weakness (generalized): Secondary | ICD-10-CM

## 2023-11-11 DIAGNOSIS — M25562 Pain in left knee: Secondary | ICD-10-CM | POA: Diagnosis not present

## 2023-11-11 DIAGNOSIS — G8929 Other chronic pain: Secondary | ICD-10-CM

## 2023-11-11 NOTE — Therapy (Signed)
 OUTPATIENT PHYSICAL THERAPY LOWER EXTREMITY EVALUATION   Patient Name: Kristine Kelly MRN: [id number] DOB:1970-07-19, 53 y.o., female Today's Date: 11/11/2023  END OF SESSION:  PT End of Session - 11/11/23 1042     Visit Number 2    Number of Visits 17    Date for Recertification  12/25/23    Authorization Type U[employer]    PT Start Time 1015    PT Stop Time 1100    PT Time Calculation (min) 45 min    Activity Tolerance Patient tolerated treatment well    Behavior During Therapy WFL for tasks assessed/performed           Past Medical History:  Diagnosis Date   Anxiety    NO RECENT ANXIETY ATTACKS PER PT ON 10-04-2020   Arthritis    OA LEFT KNEE   Claustrophobia 10/04/2020   COVID-19 2020   COUGH SOB X 6 WEEKS ALL SYMPTOMS RESOLVED   GERD (gastroesophageal reflux disease)    Headache    Hypertension    not currently on medications   Wears glasses 10/04/2020   Past Surgical History:  Procedure Laterality Date   BREAST LUMPECTOMY Right 1991   BENIGN   CHOLECYSTECTOMY  2006   LAPAROSCOPIC   DILATATION AND CURETTAGE/HYSTEROSCOPY WITH MINERVA N/A 07/30/2018   Procedure: DILATATION AND CURETTAGE/HYSTEROSCOPY WITH MINERVA;  Surgeon: Starla Harland BROCKS, MD;  Location: [facility];  Service: Gynecology;  Laterality: N/A;   TUBAL LIGATION     21 YRS AGO PER PT ON 10-04-2020   VAGINAL HYSTERECTOMY  10/11/2020   Procedure: TOTAL HYSTERECTOMY VAGINAL with RIGHT SALPINGECTOMY;  Surgeon: Lorence Ozell CROME, MD;  Location: [facility];  Service: Gynecology;;   Patient Active Problem List   Diagnosis Date Noted   Pain in left knee 04/29/2023   Osteoarthritis, multiple sites 11/12/2022   Pain in right hip 11/12/2022   Numbness and tingling 11/12/2022   Post-operative state 11/21/2020   H/O vaginal hysterectomy 11/21/2020   Vaginal discharge 10/05/2020   Menopausal symptoms 09/01/2020   Dermatophytosis 05/25/2020   Obesity 04/28/2018   Anxiety and depression  10/04/2016    PCP: [name], NP  REFERRING PROVIDER: Persons, [name], PA   REFERRING DIAG: [phone] (ICD-10-CM) - Chronic pain of left knee   THERAPY DIAG:  Chronic pain of left knee  Muscle weakness (generalized)  Rationale for Evaluation and Treatment: Rehabilitation  ONSET DATE: Chronic  SUBJECTIVE:   SUBJECTIVE STATEMENT: Still limping and still painful, but not as bad.  Eval: Pt presents to PT with reports of L knee pain after twisting injury while getting out of shower about 6 weeks ago. Notes that pain has slightly lowered after getting knee draining and cortisone injection last week. Pain is mainly in posterior L knee and calf. Denies pop or clunk sensation during injury, does have L knee buckling when walking.   PERTINENT HISTORY: HTN, see other PMH   PAIN:  7/10   Are you having pain?  Yes: NPRS scale: 8/10 Worst: 10/10 Pain location: L posterolateral knee Pain description: sharp, sore Aggravating factors: walking, stairs, transfers, work Relieving factors: none  PRECAUTIONS: None  RED FLAGS: None   WEIGHT BEARING RESTRICTIONS: No  FALLS:  Has patient fallen in last 6 months? Yes. Number of falls two  LIVING ENVIRONMENT: Lives with: lives with their family Lives in: House/apartment Stairs: Yes: External: 13 steps; bilateral but cannot reach both Has following equipment at home: None  OCCUPATION: Product manager  PLOF: Independent  PATIENT GOALS: decrease knee pain with work and daily activities  OBJECTIVE:  Note: Objective measures were completed at Evaluation unless otherwise noted.  DIAGNOSTIC FINDINGS: See imaging   PATIENT SURVEYS:  LEFS: 22/80  COGNITION: Overall cognitive status: Within functional limits for tasks assessed     SENSATION: WFL  POSTURE: rounded shoulders and forward head  PALPATION: TTP to L lateral posterior knee, L lateral gastroc  LOWER EXTREMITY ROM:  Active ROM  Right eval Left eval  Knee flexion 122 85  Knee extension 0 0   (Blank rows = not tested)  LOWER EXTREMITY MMT:  MMT Right eval Left eval  Hip flexion 4 3+  Hip extension    Hip abduction 3+ 3+  Hip adduction    Hip internal rotation    Hip external rotation    Knee flexion 4 3  Knee extension 4 3  Ankle dorsiflexion    Ankle plantarflexion    Ankle inversion    Ankle eversion     (Blank rows = not tested)  LOWER EXTREMITY SPECIAL TESTS:  Knee special tests: Posterior drawer test: negative and Lachman Test: negative  FUNCTIONAL TESTS:  30 Second Sit to Stand: 5 reps with UE  GAIT: Distance walked: 76ft Assistive device utilized: None Level of assistance: Complete Independence Comments: antalgic gait R    TREATMENT: TREATMENT 11/11/2023:  Therapeutic Exercise: - seated LAQ 2x8x3s Red TB HS curl 2x8x3s PF with red TB 2x8x3s, blue TB 2x8x3s Red TB circles CW/CCW 2x10, x8 blue TB Red TB SAQ x8x3s HEP reassessment  Manual Therapy: - STM R quad, hamstring, and proximal calf     [facility] Adult PT Treatment:                                                DATE: 10/29/2023 Therapeutic Exercise: Supine heel slide x 5 - 5 hold with strap Long sitting quad x 5 - 5 hold Calf TPR with tennis ball x 60 Standing gastroc stretch x 30 L  PATIENT EDUCATION:  Education details: eval findings, LEFS, HEP, POC Person educated: Patient Education method: Explanation, Demonstration, and Handouts Education comprehension: verbalized understanding and returned demonstration  HOME EXERCISE PROGRAM: Access Code: [email or web address] URL: [email or web address] Date: 10/29/2023 Prepared by: Alm Kingdom  Exercises - Supine Heel Slide with Strap  - 1 x daily - 7 x weekly - 2 sets - 10 reps - 5 sec hold - Long Sitting Quad Set  - 1 x daily - 7 x weekly - 2 sets - 10 reps - 5 sec hold - Calf Mobilization with Small Ball  - 1 x daily - 7 x weekly - 2-3 min hold - Gastroc  Stretch on Wall  - 1 x daily - 7 x weekly - 2-3 reps - 30 sec hold  ASSESSMENT:  CLINICAL IMPRESSION:  Patient tolerated treatment with no increases in pain with progressions in LLE loading. Current deficits include: L knee flexion ROM, Lower body activity tolerance, excessive pain, and muscle weakness. As a result, patient would continue to benefit from skilled PT to address said deficits via plan below.     EVAL: Patient is a 53 y.o. F who was seen today for physical therapy evaluation and treatment for acute on chronic L knee pain. Physical findings are consistent with referring provider impression as pt demonstrates decrease in L knee  ROM and strength. LEFS shows severe disability in performance of home ADLs and higher level activities. Pt would benefit from skilled PT services working on improving L knee ROM and strength in order to decrease pain and improve comfort.   OBJECTIVE IMPAIRMENTS: Abnormal gait, decreased activity tolerance, decreased mobility, difficulty walking, decreased ROM, decreased strength, and pain   ACTIVITY LIMITATIONS: sitting, standing, squatting, sleeping, stairs, transfers, and locomotion level  PARTICIPATION LIMITATIONS: meal prep, cleaning, driving, shopping, community activity, occupation, and yard work  PERSONAL FACTORS: Time since onset of injury/illness/exacerbation and 1-2 comorbidities: HTN are also affecting patient's functional outcome.   REHAB POTENTIAL: Good  CLINICAL DECISION MAKING: Evolving/moderate complexity  EVALUATION COMPLEXITY: Moderate   GOALS: Goals reviewed with patient? No  SHORT TERM GOALS: Target date: 11/19/2023   Pt will be compliant and knowledgeable with initial HEP for improved comfort and carryover Baseline: initial HEP given  Goal status: INITIAL  2.  Pt will self report left knee pain no greater than 6/10 for improved comfort and functional ability Baseline: 10/10 at worst Goal status: INITIAL   LONG TERM GOALS:  Target date: 12/25/2023   Pt will improve LEFS to no less than 35/80 as proxy for functional improvement with home ADLs and higher level community activity Baseline: 22/80 Goal status: INITIAL   2.  Pt will self report left knee pain no greater than 4/10 for improved comfort and functional ability Baseline: 10/10 at worst Goal status: INITIAL   3.  Pt will increase 30 Second Sit to Stand rep count to no less than 10 reps for improved balance, strength, and functional mobility Baseline: 5 reps  Goal status: INITIAL   4.  Pt will improve L knee flexion AROM to at least 120 degrees for improved comfort and functional mobility Baseline: 85 degrees Goal status: INITIAL  5.  Pt will improve L LE MMT to at least 4/5 for improved comfort and functional mobility while decreasing knee pain Baseline: see chart Goal status: INITIAL   PLAN:  PT FREQUENCY: 1-2x/week  PT DURATION: 8 weeks  PLANNED INTERVENTIONS: 97164- PT Re-evaluation, 97110-Therapeutic exercises, 97530- Therapeutic activity, 97112- Neuromuscular re-education, 97535- Self Care, 02859- Manual therapy, [id number]- Gait training, [phone]- Aquatic Therapy, [phone]- Electrical stimulation (unattended), [id number]- Electrical stimulation (manual), 97016- Vasopneumatic device, 20560 (1-2 muscles), 20561 (3+ muscles)- Dry Needling, Patient/Family education, Cryotherapy, and Moist heat  PLAN FOR NEXT SESSION: HEP assessment and progression, symptom modulation, and loading (isolated and/or functional). Manual therapy and NME training as needed.     [place] Greener  Robbin Loughmiller, PT 11/11/2023, 10:48 AM

## 2023-11-14 ENCOUNTER — Ambulatory Visit

## 2023-11-14 DIAGNOSIS — M25562 Pain in left knee: Secondary | ICD-10-CM | POA: Diagnosis not present

## 2023-11-14 DIAGNOSIS — R2689 Other abnormalities of gait and mobility: Secondary | ICD-10-CM

## 2023-11-14 DIAGNOSIS — M6281 Muscle weakness (generalized): Secondary | ICD-10-CM

## 2023-11-14 DIAGNOSIS — G8929 Other chronic pain: Secondary | ICD-10-CM

## 2023-11-14 NOTE — Therapy (Signed)
 OUTPATIENT PHYSICAL THERAPY LOWER EXTREMITY EVALUATION   Patient Name: Kristine Kelly MRN: [id number] DOB:[birth year], 53 y.o., female Today's Date: 11/14/2023  END OF SESSION:  PT End of Session - 11/14/23 1402     Visit Number 3    Number of Visits 17    Date for Recertification  12/25/23    Authorization Type U[employer]    PT Start Time 1400    PT Stop Time 1438    PT Time Calculation (min) 38 min    Activity Tolerance Patient tolerated treatment well    Behavior During Therapy WFL for tasks assessed/performed           Past Medical History:  Diagnosis Date   Anxiety    NO RECENT ANXIETY ATTACKS PER PT ON 10-04-2020   Arthritis    OA LEFT KNEE   Claustrophobia 10/04/2020   COVID-19 2020   COUGH SOB X 6 WEEKS ALL SYMPTOMS RESOLVED   GERD (gastroesophageal reflux disease)    Headache    Hypertension    not currently on medications   Wears glasses 10/04/2020   Past Surgical History:  Procedure Laterality Date   BREAST LUMPECTOMY Right 1991   BENIGN   CHOLECYSTECTOMY  2006   LAPAROSCOPIC   DILATATION AND CURETTAGE/HYSTEROSCOPY WITH MINERVA N/A 07/30/2018   Procedure: DILATATION AND CURETTAGE/HYSTEROSCOPY WITH MINERVA;  Surgeon: Starla Harland BROCKS, MD;  Location: [facility];  Service: Gynecology;  Laterality: N/A;   TUBAL LIGATION     21 YRS AGO PER PT ON 10-04-2020   VAGINAL HYSTERECTOMY  10/11/2020   Procedure: TOTAL HYSTERECTOMY VAGINAL with RIGHT SALPINGECTOMY;  Surgeon: Lorence Ozell CROME, MD;  Location: [facility];  Service: Gynecology;;   Patient Active Problem List   Diagnosis Date Noted   Pain in left knee 04/29/2023   Osteoarthritis, multiple sites 11/12/2022   Pain in right hip 11/12/2022   Numbness and tingling 11/12/2022   Post-operative state 11/21/2020   H/O vaginal hysterectomy 11/21/2020   Vaginal discharge 10/05/2020   Menopausal symptoms 09/01/2020   Dermatophytosis 05/25/2020   Obesity 04/28/2018   Anxiety and depression  10/04/2016    PCP: [name], NP  REFERRING PROVIDER: Persons, [name], PA   REFERRING DIAG: [phone] (ICD-10-CM) - Chronic pain of left knee   THERAPY DIAG:  Chronic pain of left knee  Muscle weakness (generalized)  Other abnormalities of gait and mobility  Rationale for Evaluation and Treatment: Rehabilitation  ONSET DATE: Chronic  SUBJECTIVE:   SUBJECTIVE STATEMENT: Pt presents to PT with reports of severe pain in L knee. Feels like work really aggravated it today.   Eval: Pt presents to PT with reports of L knee pain after twisting injury while getting out of shower about 6 weeks ago. Notes that pain has slightly lowered after getting knee draining and cortisone injection last week. Pain is mainly in posterior L knee and calf. Denies pop or clunk sensation during injury, does have L knee buckling when walking.   PERTINENT HISTORY: HTN, see other PMH   PAIN:   Are you having pain?  Yes: NPRS scale: 8/10 Worst: 10/10 Pain location: L posterolateral knee Pain description: sharp, sore Aggravating factors: walking, stairs, transfers, work Relieving factors: none  PRECAUTIONS: None  RED FLAGS: None   WEIGHT BEARING RESTRICTIONS: No  FALLS:  Has patient fallen in last 6 months? Yes. Number of falls two  LIVING ENVIRONMENT: Lives with: lives with their family Lives in: House/apartment Stairs: Yes: External: 13 steps;  bilateral but cannot reach both Has following equipment at home: None  OCCUPATION: Product manager   PLOF: Independent  PATIENT GOALS: decrease knee pain with work and daily activities  OBJECTIVE:  Note: Objective measures were completed at Evaluation unless otherwise noted.  DIAGNOSTIC FINDINGS: See imaging   PATIENT SURVEYS:  LEFS: 22/80  COGNITION: Overall cognitive status: Within functional limits for tasks assessed     SENSATION: WFL  POSTURE: rounded shoulders and forward head  PALPATION: TTP to  L lateral posterior knee, L lateral gastroc  LOWER EXTREMITY ROM:  Active ROM Right eval Left eval  Knee flexion 122 85  Knee extension 0 0   (Blank rows = not tested)  LOWER EXTREMITY MMT:  MMT Right eval Left eval  Hip flexion 4 3+  Hip extension    Hip abduction 3+ 3+  Hip adduction    Hip internal rotation    Hip external rotation    Knee flexion 4 3  Knee extension 4 3  Ankle dorsiflexion    Ankle plantarflexion    Ankle inversion    Ankle eversion     (Blank rows = not tested)  LOWER EXTREMITY SPECIAL TESTS:  Knee special tests: Posterior drawer test: negative and Lachman Test: negative  FUNCTIONAL TESTS:  30 Second Sit to Stand: 5 reps with UE  GAIT: Distance walked: 66ft Assistive device utilized: None Level of assistance: Complete Independence Comments: antalgic gait R   TREATMENT: [facility] Adult PT Treatment:                                                DATE: 11/14/2023 Supine QS with towel under knee 2x10 - 5 hold L SAQ 2x10 L LAQ 2x8 L Seated hamstring curl 2x10 RTB Seated clamshell 2x15 GTB Manual Therapy: STM L quad, hamstring, and proximal calf  [facility] Adult PT Treatment:                                                DATE: 11/11/2023 Therapeutic Exercise: Seated LAQ 2x8x3 Red TB HS curl 2x8x3s PF with red TB 2x8x3s, blue TB 2x8x3s Red TB circles CW/CCW 2x10, x8 blue TB Red TB SAQ x8x3s HEP reassessment Manual Therapy: STM L quad, hamstring, and proximal calf  [facility] Adult PT Treatment:                                                DATE: 10/29/2023 Therapeutic Exercise: Supine heel slide x 5 - 5 hold with strap Long sitting quad x 5 - 5 hold Calf TPR with tennis ball x 60 Standing gastroc stretch x 30 L  PATIENT EDUCATION:  Education details: eval findings, LEFS, HEP, POC Person educated: Patient Education method: Explanation, Demonstration, and Handouts Education comprehension: verbalized understanding and returned  demonstration  HOME EXERCISE PROGRAM: Access Code: [email or web address] URL: [email or web address] Date: 10/29/2023 Prepared by: Alm Kingdom  Exercises - Supine Heel Slide with Strap  - 1 x daily - 7 x weekly - 2 sets - 10 reps - 5 sec hold - Long Sitting Quad Set  - 1 x daily -  7 x weekly - 2 sets - 10 reps - 5 sec hold - Calf Mobilization with Small Ball  - 1 x daily - 7 x weekly - 2-3 min hold - Gastroc Stretch on Wall  - 1 x daily - 7 x weekly - 2-3 reps - 30 sec hold  ASSESSMENT:  CLINICAL IMPRESSION: Pt was limited today due to continued pain around L knee. Responded fair to manual therapy, noting slight decrease in pain and tightness. We dicussed that she may need to call and get a f/u visit with ortho if severe symptoms persist. PT will continue to progress her as tolerated.    EVAL: Patient is a 53 y.o. F who was seen today for physical therapy evaluation and treatment for acute on chronic L knee pain. Physical findings are consistent with referring provider impression as pt demonstrates decrease in L knee ROM and strength. LEFS shows severe disability in performance of home ADLs and higher level activities. Pt would benefit from skilled PT services working on improving L knee ROM and strength in order to decrease pain and improve comfort.   OBJECTIVE IMPAIRMENTS: Abnormal gait, decreased activity tolerance, decreased mobility, difficulty walking, decreased ROM, decreased strength, and pain   ACTIVITY LIMITATIONS: sitting, standing, squatting, sleeping, stairs, transfers, and locomotion level  PARTICIPATION LIMITATIONS: meal prep, cleaning, driving, shopping, community activity, occupation, and yard work  PERSONAL FACTORS: Time since onset of injury/illness/exacerbation and 1-2 comorbidities: HTN are also affecting patient's functional outcome.   REHAB POTENTIAL: Good  CLINICAL DECISION MAKING: Evolving/moderate complexity  EVALUATION COMPLEXITY:  Moderate   GOALS: Goals reviewed with patient? No  SHORT TERM GOALS: Target date: 11/19/2023   Pt will be compliant and knowledgeable with initial HEP for improved comfort and carryover Baseline: initial HEP given  Goal status: MET  2.  Pt will self report left knee pain no greater than 6/10 for improved comfort and functional ability Baseline: 10/10 at worst Goal status: INITIAL   LONG TERM GOALS: Target date: 12/25/2023   Pt will improve LEFS to no less than 35/80 as proxy for functional improvement with home ADLs and higher level community activity Baseline: 22/80 Goal status: INITIAL   2.  Pt will self report left knee pain no greater than 4/10 for improved comfort and functional ability Baseline: 10/10 at worst Goal status: INITIAL   3.  Pt will increase 30 Second Sit to Stand rep count to no less than 10 reps for improved balance, strength, and functional mobility Baseline: 5 reps  Goal status: INITIAL   4.  Pt will improve L knee flexion AROM to at least 120 degrees for improved comfort and functional mobility Baseline: 85 degrees Goal status: INITIAL  5.  Pt will improve L LE MMT to at least 4/5 for improved comfort and functional mobility while decreasing knee pain Baseline: see chart Goal status: INITIAL   PLAN:  PT FREQUENCY: 1-2x/week  PT DURATION: 8 weeks  PLANNED INTERVENTIONS: 97164- PT Re-evaluation, 97110-Therapeutic exercises, 97530- Therapeutic activity, 97112- Neuromuscular re-education, 97535- Self Care, 02859- Manual therapy, [id number]- Gait training, [phone]- Aquatic Therapy, [phone]- Electrical stimulation (unattended), [id number]- Electrical stimulation (manual), 97016- Vasopneumatic device, 20560 (1-2 muscles), 20561 (3+ muscles)- Dry Needling, Patient/Family education, Cryotherapy, and Moist heat  PLAN FOR NEXT SESSION: HEP assessment and progression, symptom modulation, and loading (isolated and/or functional). Manual therapy and NME training as needed.      Alm JAYSON Kingdom, PT 11/14/2023, 2:51 PM

## 2023-11-18 ENCOUNTER — Ambulatory Visit

## 2023-11-18 DIAGNOSIS — M25562 Pain in left knee: Secondary | ICD-10-CM | POA: Diagnosis not present

## 2023-11-18 DIAGNOSIS — M6281 Muscle weakness (generalized): Secondary | ICD-10-CM

## 2023-11-18 DIAGNOSIS — G8929 Other chronic pain: Secondary | ICD-10-CM

## 2023-11-18 NOTE — Therapy (Signed)
 OUTPATIENT PHYSICAL THERAPY LOWER EXTREMITY EVALUATION   Patient Name: Kristine Kelly MRN: [id number] DOB:[birth year], 53 y.o., female Today's Date: 11/18/2023  END OF SESSION:  PT End of Session - 11/18/23 1059     Visit Number 4    Number of Visits 17    Date for Recertification  12/25/23    Authorization Type [employer]    PT Start Time 1100    PT Stop Time 1145    PT Time Calculation (min) 45 min    Activity Tolerance Patient tolerated treatment well    Behavior During Therapy WFL for tasks assessed/performed           Past Medical History:  Diagnosis Date   Anxiety    NO RECENT ANXIETY ATTACKS PER PT ON 10-04-2020   Arthritis    OA LEFT KNEE   Claustrophobia 10/04/2020   COVID-19 2020   COUGH SOB X 6 WEEKS ALL SYMPTOMS RESOLVED   GERD (gastroesophageal reflux disease)    Headache    Hypertension    not currently on medications   Wears glasses 10/04/2020   Past Surgical History:  Procedure Laterality Date   BREAST LUMPECTOMY Right 1991   BENIGN   CHOLECYSTECTOMY  2006   LAPAROSCOPIC   DILATATION AND CURETTAGE/HYSTEROSCOPY WITH MINERVA N/A 07/30/2018   Procedure: DILATATION AND CURETTAGE/HYSTEROSCOPY WITH MINERVA;  Surgeon: Starla Harland BROCKS, MD;  Location: [facility];  Service: Gynecology;  Laterality: N/A;   TUBAL LIGATION     21 YRS AGO PER PT ON 10-04-2020   VAGINAL HYSTERECTOMY  10/11/2020   Procedure: TOTAL HYSTERECTOMY VAGINAL with RIGHT SALPINGECTOMY;  Surgeon: Lorence Ozell CROME, MD;  Location: [facility];  Service: Gynecology;;   Patient Active Problem List   Diagnosis Date Noted   Pain in left knee 04/29/2023   Osteoarthritis, multiple sites 11/12/2022   Pain in right hip 11/12/2022   Numbness and tingling 11/12/2022   Post-operative state 11/21/2020   H/O vaginal hysterectomy 11/21/2020   Vaginal discharge 10/05/2020   Menopausal symptoms 09/01/2020   Dermatophytosis 05/25/2020   Obesity 04/28/2018   Anxiety and depression  10/04/2016    PCP: [name], NP  REFERRING PROVIDER: Persons, [name], PA   REFERRING DIAG: [phone] (ICD-10-CM) - Chronic pain of left knee   THERAPY DIAG:  Chronic pain of left knee  Muscle weakness (generalized)  Rationale for Evaluation and Treatment: Rehabilitation  ONSET DATE: Chronic  SUBJECTIVE:   SUBJECTIVE STATEMENT: Not doing too bad today. Been doing the HEP on the bed.   Eval: Pt presents to PT with reports of L knee pain after twisting injury while getting out of shower about 6 weeks ago. Notes that pain has slightly lowered after getting knee draining and cortisone injection last week. Pain is mainly in posterior L knee and calf. Denies pop or clunk sensation during injury, does have L knee buckling when walking.   PERTINENT HISTORY: HTN, see other PMH   PAIN:  3/10 currently  Are you having pain?  Yes: NPRS scale: 8/10 Worst: 10/10 Pain location: L posterolateral knee Pain description: sharp, sore Aggravating factors: walking, stairs, transfers, work Relieving factors: none  PRECAUTIONS: None  RED FLAGS: None   WEIGHT BEARING RESTRICTIONS: No  FALLS:  Has patient fallen in last 6 months? Yes. Number of falls two  LIVING ENVIRONMENT: Lives with: lives with their family Lives in: House/apartment Stairs: Yes: External: 13 steps; bilateral but cannot reach both Has following equipment at home: None  OCCUPATION: Product Manager   PLOF: Independent  PATIENT GOALS: decrease knee pain with work and daily activities  OBJECTIVE:  Note: Objective measures were completed at Evaluation unless otherwise noted.  DIAGNOSTIC FINDINGS: See imaging   PATIENT SURVEYS:  LEFS: 22/80  COGNITION: Overall cognitive status: Within functional limits for tasks assessed     SENSATION: WFL  POSTURE: rounded shoulders and forward head  PALPATION: TTP to L lateral posterior knee, L lateral gastroc  LOWER EXTREMITY  ROM:  Active ROM Right eval Left eval  Knee flexion 122 85  Knee extension 0 0   (Blank rows = not tested)  LOWER EXTREMITY MMT:  MMT Right eval Left eval  Hip flexion 4 3+  Hip extension    Hip abduction 3+ 3+  Hip adduction    Hip internal rotation    Hip external rotation    Knee flexion 4 3  Knee extension 4 3  Ankle dorsiflexion    Ankle plantarflexion    Ankle inversion    Ankle eversion     (Blank rows = not tested)  LOWER EXTREMITY SPECIAL TESTS:  Knee special tests: Posterior drawer test: negative and Lachman Test: negative  FUNCTIONAL TESTS:  30 Second Sit to Stand: 5 reps with UE  GAIT: Distance walked: 74ft Assistive device utilized: None Level of assistance: Complete Independence Comments: antalgic gait R   TREATMENT: TREATMENT 11/18/2023:  Therapeutic Exercise: HEP reassessment and update Seated hip marches 2x8x3s UL green TB PF 2x8x3s BL Seated clamshell red 2x8x3s Green TB HS curl x8x3s BL STS x4 (120 degrees KF, cuing on loading LLE)   Manual Therapy: STM left medial quad, proximal gastroc  Heating pad (last 10 minutes of session on anterior and posterior L knee)       [facility] Adult PT Treatment:                                                DATE: 11/14/2023 Supine QS with towel under knee 2x10 - 5 hold L SAQ 2x10 L LAQ 2x8 L Seated hamstring curl 2x10 RTB Seated clamshell 2x15 GTB Manual Therapy: STM L quad, hamstring, and proximal calf  [facility] Adult PT Treatment:                                                DATE: 11/11/2023 Therapeutic Exercise: Seated LAQ 2x8x3 Red TB HS curl 2x8x3s PF with red TB 2x8x3s, blue TB 2x8x3s Red TB circles CW/CCW 2x10, x8 blue TB Red TB SAQ x8x3s HEP reassessment Manual Therapy: STM L quad, hamstring, and proximal calf  [facility] Adult PT Treatment:                                                DATE: 10/29/2023 Therapeutic Exercise: Supine heel slide x 5 - 5 hold with strap Long sitting  quad x 5 - 5 hold Calf TPR with tennis ball x 60 Standing gastroc stretch x 30 L  PATIENT EDUCATION:  Education details: eval findings, LEFS, HEP, POC Person educated: Patient Education method: Explanation, Demonstration, and Handouts Education comprehension: verbalized  understanding and returned demonstration  HOME EXERCISE PROGRAM: Access Code: [email or web address] URL: [email or web address] Date: 10/29/2023 Prepared by: Alm Kingdom  Exercises - Supine Heel Slide with Strap  - 1 x daily - 7 x weekly - 2 sets - 10 reps - 5 sec hold - Long Sitting Quad Set  - 1 x daily - 7 x weekly - 2 sets - 10 reps - 5 sec hold - Calf Mobilization with Small Ball  - 1 x daily - 7 x weekly - 2-3 min hold - Gastroc Stretch on Wall  - 1 x daily - 7 x weekly - 2-3 reps - 30 sec hold  ASSESSMENT:  CLINICAL IMPRESSION: Patient tolerated treatment with no significant increases in pain with progressions in BL LE loading (isolated and functional). Current deficits include: excessive pain, activity tolerance, strength. As a result, patient would continue to benefit from skilled PT to address said deficits via plan below.     EVAL: Patient is a 53 y.o. F who was seen today for physical therapy evaluation and treatment for acute on chronic L knee pain. Physical findings are consistent with referring provider impression as pt demonstrates decrease in L knee ROM and strength. LEFS shows severe disability in performance of home ADLs and higher level activities. Pt would benefit from skilled PT services working on improving L knee ROM and strength in order to decrease pain and improve comfort.   OBJECTIVE IMPAIRMENTS: Abnormal gait, decreased activity tolerance, decreased mobility, difficulty walking, decreased ROM, decreased strength, and pain   ACTIVITY LIMITATIONS: sitting, standing, squatting, sleeping, stairs, transfers, and locomotion level  PARTICIPATION LIMITATIONS: meal prep, cleaning, driving,  shopping, community activity, occupation, and yard work  PERSONAL FACTORS: Time since onset of injury/illness/exacerbation and 1-2 comorbidities: HTN are also affecting patient's functional outcome.   REHAB POTENTIAL: Good  CLINICAL DECISION MAKING: Evolving/moderate complexity  EVALUATION COMPLEXITY: Moderate   GOALS: Goals reviewed with patient? No  SHORT TERM GOALS: Target date: 11/19/2023   Pt will be compliant and knowledgeable with initial HEP for improved comfort and carryover Baseline: initial HEP given  Goal status: MET  2.  Pt will self report left knee pain no greater than 6/10 for improved comfort and functional ability Baseline: 10/10 at worst Goal status: INITIAL   LONG TERM GOALS: Target date: 12/25/2023   Pt will improve LEFS to no less than 35/80 as proxy for functional improvement with home ADLs and higher level community activity Baseline: 22/80 Goal status: INITIAL   2.  Pt will self report left knee pain no greater than 4/10 for improved comfort and functional ability Baseline: 10/10 at worst Goal status: INITIAL   3.  Pt will increase 30 Second Sit to Stand rep count to no less than 10 reps for improved balance, strength, and functional mobility Baseline: 5 reps  Goal status: INITIAL   4.  Pt will improve L knee flexion AROM to at least 120 degrees for improved comfort and functional mobility Baseline: 85 degrees Goal status: INITIAL  5.  Pt will improve L LE MMT to at least 4/5 for improved comfort and functional mobility while decreasing knee pain Baseline: see chart Goal status: INITIAL   PLAN:  PT FREQUENCY: 1-2x/week  PT DURATION: 8 weeks  PLANNED INTERVENTIONS: 97164- PT Re-evaluation, 97110-Therapeutic exercises, 97530- Therapeutic activity, [id number]- Neuromuscular re-education, 97535- Self Care, [id number]- Manual therapy, [id number]- Gait training, [phone]- Aquatic Therapy, [id number]- Electrical stimulation (unattended), [id number]- Electrical stimulation  (manual), [id number]- Vasopneumatic device, [id number] (1-2 muscles), [id number] (3+ muscles)- Dry  Needling, Patient/Family education, Cryotherapy, and Moist heat  PLAN FOR NEXT SESSION: HEP assessment and progression, symptom modulation, and loading (isolated and/or functional). Manual therapy and NME training as needed.     [place] Greener  Kayti Poss, PT 11/18/2023, 11:36 AM

## 2023-11-21 ENCOUNTER — Ambulatory Visit

## 2023-11-21 DIAGNOSIS — M6281 Muscle weakness (generalized): Secondary | ICD-10-CM

## 2023-11-21 DIAGNOSIS — R2689 Other abnormalities of gait and mobility: Secondary | ICD-10-CM

## 2023-11-21 DIAGNOSIS — M25562 Pain in left knee: Secondary | ICD-10-CM | POA: Diagnosis not present

## 2023-11-21 DIAGNOSIS — G8929 Other chronic pain: Secondary | ICD-10-CM

## 2023-11-21 NOTE — Therapy (Signed)
 OUTPATIENT PHYSICAL THERAPY TREATMENT   Patient Name: Kristine Kelly MRN: [id number] DOB:[birth year], 53 y.o., female Today's Date: 11/21/2023  END OF SESSION:  PT End of Session - 11/21/23 1357     Visit Number 5    Number of Visits 17    Date for Recertification  12/25/23    Authorization Type UHC    PT Start Time 1400    PT Stop Time 1439    PT Time Calculation (min) 39 min    Activity Tolerance Patient tolerated treatment well    Behavior During Therapy WFL for tasks assessed/performed            Past Medical History:  Diagnosis Date   Anxiety    NO RECENT ANXIETY ATTACKS PER PT ON 10-04-2020   Arthritis    OA LEFT KNEE   Claustrophobia 10/04/2020   COVID-19 2020   COUGH SOB X 6 WEEKS ALL SYMPTOMS RESOLVED   GERD (gastroesophageal reflux disease)    Headache    Hypertension    not currently on medications   Wears glasses 10/04/2020   Past Surgical History:  Procedure Laterality Date   BREAST LUMPECTOMY Right 1991   BENIGN   CHOLECYSTECTOMY  2006   LAPAROSCOPIC   DILATATION AND CURETTAGE/HYSTEROSCOPY WITH MINERVA N/A 07/30/2018   Procedure: DILATATION AND CURETTAGE/HYSTEROSCOPY WITH MINERVA;  Surgeon: Starla Harland BROCKS, MD;  Location: [facility];  Service: Gynecology;  Laterality: N/A;   TUBAL LIGATION     21 YRS AGO PER PT ON 10-04-2020   VAGINAL HYSTERECTOMY  10/11/2020   Procedure: TOTAL HYSTERECTOMY VAGINAL with RIGHT SALPINGECTOMY;  Surgeon: Lorence Ozell CROME, MD;  Location: [facility];  Service: Gynecology;;   Patient Active Problem List   Diagnosis Date Noted   Pain in left knee 04/29/2023   Osteoarthritis, multiple sites 11/12/2022   Pain in right hip 11/12/2022   Numbness and tingling 11/12/2022   Post-operative state 11/21/2020   H/O vaginal hysterectomy 11/21/2020   Vaginal discharge 10/05/2020   Menopausal symptoms 09/01/2020   Dermatophytosis 05/25/2020   Obesity 04/28/2018   Anxiety and depression 10/04/2016     PCP: [name], NP  REFERRING PROVIDER: Persons, [name], PA   REFERRING DIAG: [phone] (ICD-10-CM) - Chronic pain of left knee   THERAPY DIAG:  Chronic pain of left knee  Muscle weakness (generalized)  Other abnormalities of gait and mobility  Rationale for Evaluation and Treatment: Rehabilitation  ONSET DATE: Chronic  SUBJECTIVE:   SUBJECTIVE STATEMENT: Pt presents to PT with reports of 3/10 pain in L knee. Feels better as she has not worked the last few days.   Eval: Pt presents to PT with reports of L knee pain after twisting injury while getting out of shower about 6 weeks ago. Notes that pain has slightly lowered after getting knee draining and cortisone injection last week. Pain is mainly in posterior L knee and calf. Denies pop or clunk sensation during injury, does have L knee buckling when walking.   PERTINENT HISTORY: HTN, see other PMH   PAIN:   Are you having pain?  Yes: NPRS scale: 8/10 Worst: 10/10 Pain location: L posterolateral knee Pain description: sharp, sore Aggravating factors: walking, stairs, transfers, work Relieving factors: none  PRECAUTIONS: None  RED FLAGS: None   WEIGHT BEARING RESTRICTIONS: No  FALLS:  Has patient fallen in last 6 months? Yes. Number of falls two  LIVING ENVIRONMENT: Lives with: lives with their family Lives in: House/apartment Stairs: Yes:  External: 13 steps; bilateral but cannot reach both Has following equipment at home: None  OCCUPATION: Product Manager   PLOF: Independent  PATIENT GOALS: decrease knee pain with work and daily activities  OBJECTIVE:  Note: Objective measures were completed at Evaluation unless otherwise noted.  DIAGNOSTIC FINDINGS: See imaging   PATIENT SURVEYS:  LEFS: 22/80  COGNITION: Overall cognitive status: Within functional limits for tasks assessed     SENSATION: WFL  POSTURE: rounded shoulders and forward head  PALPATION: TTP to L  lateral posterior knee, L lateral gastroc  LOWER EXTREMITY ROM:  Active ROM Right eval Left eval  Knee flexion 122 85  Knee extension 0 0   (Blank rows = not tested)  LOWER EXTREMITY MMT:  MMT Right eval Left eval  Hip flexion 4 3+  Hip extension    Hip abduction 3+ 3+  Hip adduction    Hip internal rotation    Hip external rotation    Knee flexion 4 3  Knee extension 4 3  Ankle dorsiflexion    Ankle plantarflexion    Ankle inversion    Ankle eversion     (Blank rows = not tested)  LOWER EXTREMITY SPECIAL TESTS:  Knee special tests: Posterior drawer test: negative and Lachman Test: negative  FUNCTIONAL TESTS:  30 Second Sit to Stand: 5 reps with UE  GAIT: Distance walked: 35ft Assistive device utilized: None Level of assistance: Complete Independence Comments: antalgic gait R   TREATMENT: [facility] Adult PT Treatment:                                                DATE: 11/21/2023 Supine QS with towel under knee 2x10 - 5 hold L Supine SLR 3x5 L Hooklying clamshell 2x15 GTB Seated PF GTB 2x10 LAQ x 10 L Manual Therapy: STM L proximal calf and L lateral hamstring/knee AP and PA grade II mobs to L knee in flexion Modalities: MHP to anterior thigh and posterior knee/calf L in prone x 10 min post session   TREATMENT 11/18/2023:  Therapeutic Exercise: HEP reassessment and update Seated hip marches 2x8x3s UL green TB PF 2x8x3s BL Seated clamshell red 2x8x3s Green TB HS curl x8x3s BL STS x4 (120 degrees KF, cuing on loading LLE) Manual Therapy: STM left medial quad, proximal gastroc  Heating pad (last 10 minutes of session on anterior and posterior L knee)   [facility] Adult PT Treatment:                                                DATE: 11/14/2023 Supine QS with towel under knee 2x10 - 5 hold L SAQ 2x10 L LAQ 2x8 L Seated hamstring curl 2x10 RTB Seated clamshell 2x15 GTB Manual Therapy: STM L quad, hamstring, and proximal calf  [facility] Adult PT  Treatment:                                                DATE: 11/11/2023 Therapeutic Exercise: Seated LAQ 2x8x3 Red TB HS curl 2x8x3s PF with red TB 2x8x3s, blue TB 2x8x3s Red TB circles CW/CCW 2x10,  x8 blue TB Red TB SAQ x8x3s HEP reassessment Manual Therapy: STM L quad, hamstring, and proximal calf  [facility] Adult PT Treatment:                                                DATE: 10/29/2023 Therapeutic Exercise: Supine heel slide x 5 - 5 hold with strap Long sitting quad x 5 - 5 hold Calf TPR with tennis ball x 60 Standing gastroc stretch x 30 L  PATIENT EDUCATION:  Education details: eval findings, LEFS, HEP, POC Person educated: Patient Education method: Explanation, Demonstration, and Handouts Education comprehension: verbalized understanding and returned demonstration  HOME EXERCISE PROGRAM: Access Code: [email or web address] URL: [email or web address] Date: 10/29/2023 Prepared by: Alm Kingdom  Exercises - Supine Heel Slide with Strap  - 1 x daily - 7 x weekly - 2 sets - 10 reps - 5 sec hold - Long Sitting Quad Set  - 1 x daily - 7 x weekly - 2 sets - 10 reps - 5 sec hold - Calf Mobilization with Small Ball  - 1 x daily - 7 x weekly - 2-3 min hold - Gastroc Stretch on Wall  - 1 x daily - 7 x weekly - 2-3 reps - 30 sec hold  ASSESSMENT:  CLINICAL IMPRESSION: Pt was able to all prescribed exercises with no adverse effect. We continued low level quad activation and L LE strengthening. Responded well to manual therapy noting decreased pain post. Continues to benefit from skilled PT, will progress as able.   EVAL: Patient is a 53 y.o. F who was seen today for physical therapy evaluation and treatment for acute on chronic L knee pain. Physical findings are consistent with referring provider impression as pt demonstrates decrease in L knee ROM and strength. LEFS shows severe disability in performance of home ADLs and higher level activities. Pt would benefit from skilled  PT services working on improving L knee ROM and strength in order to decrease pain and improve comfort.   OBJECTIVE IMPAIRMENTS: Abnormal gait, decreased activity tolerance, decreased mobility, difficulty walking, decreased ROM, decreased strength, and pain   ACTIVITY LIMITATIONS: sitting, standing, squatting, sleeping, stairs, transfers, and locomotion level  PARTICIPATION LIMITATIONS: meal prep, cleaning, driving, shopping, community activity, occupation, and yard work  PERSONAL FACTORS: Time since onset of injury/illness/exacerbation and 1-2 comorbidities: HTN are also affecting patient's functional outcome.   REHAB POTENTIAL: Good  CLINICAL DECISION MAKING: Evolving/moderate complexity  EVALUATION COMPLEXITY: Moderate   GOALS: Goals reviewed with patient? No  SHORT TERM GOALS: Target date: 11/19/2023   Pt will be compliant and knowledgeable with initial HEP for improved comfort and carryover Baseline: initial HEP given  Goal status: MET  2.  Pt will self report left knee pain no greater than 6/10 for improved comfort and functional ability Baseline: 10/10 at worst Goal status: INITIAL   LONG TERM GOALS: Target date: 12/25/2023   Pt will improve LEFS to no less than 35/80 as proxy for functional improvement with home ADLs and higher level community activity Baseline: 22/80 Goal status: INITIAL   2.  Pt will self report left knee pain no greater than 4/10 for improved comfort and functional ability Baseline: 10/10 at worst Goal status: INITIAL   3.  Pt will increase 30 Second Sit to Stand rep count to no less than 10 reps for improved balance, strength, and functional  mobility Baseline: 5 reps  Goal status: INITIAL   4.  Pt will improve L knee flexion AROM to at least 120 degrees for improved comfort and functional mobility Baseline: 85 degrees Goal status: INITIAL  5.  Pt will improve L LE MMT to at least 4/5 for improved comfort and functional mobility while  decreasing knee pain Baseline: see chart Goal status: INITIAL   PLAN:  PT FREQUENCY: 1-2x/week  PT DURATION: 8 weeks  PLANNED INTERVENTIONS: 97164- PT Re-evaluation, 97110-Therapeutic exercises, 97530- Therapeutic activity, 97112- Neuromuscular re-education, 97535- Self Care, 02859- Manual therapy, [id number]- Gait training, [phone]- Aquatic Therapy, [phone]- Electrical stimulation (unattended), [id number]- Electrical stimulation (manual), 97016- Vasopneumatic device, 20560 (1-2 muscles), 20561 (3+ muscles)- Dry Needling, Patient/Family education, Cryotherapy, and Moist heat  PLAN FOR NEXT SESSION: HEP assessment and progression, symptom modulation, and loading (isolated and/or functional). Manual therapy and NME training as needed.     Alm JAYSON Kingdom, PT 11/21/2023, 3:22 PM

## 2023-11-25 ENCOUNTER — Encounter: Payer: Self-pay | Admitting: Radiology

## 2023-11-25 ENCOUNTER — Ambulatory Visit: Attending: Physician Assistant

## 2023-11-25 DIAGNOSIS — M25562 Pain in left knee: Secondary | ICD-10-CM | POA: Insufficient documentation

## 2023-11-25 DIAGNOSIS — G8929 Other chronic pain: Secondary | ICD-10-CM | POA: Insufficient documentation

## 2023-11-25 DIAGNOSIS — M6281 Muscle weakness (generalized): Secondary | ICD-10-CM | POA: Insufficient documentation

## 2023-11-25 NOTE — Therapy (Addendum)
 OUTPATIENT PHYSICAL THERAPY TREATMENT   Patient Name: Kristine Kelly MRN: [id number] DOB:1970/09/16, 53 y.o., female Today's Date: 11/25/2023  END OF SESSION:  PT End of Session - 11/25/23 1132     Visit Number 6    Number of Visits 17    Date for Recertification  12/25/23    Authorization Type [employer]    PT Start Time 1100    PT Stop Time 1145    PT Time Calculation (min) 45 min    Activity Tolerance Patient tolerated treatment well    Behavior During Therapy WFL for tasks assessed/performed             Past Medical History:  Diagnosis Date   Anxiety    NO RECENT ANXIETY ATTACKS PER PT ON 10-04-2020   Arthritis    OA LEFT KNEE   Claustrophobia 10/04/2020   COVID-19 2020   COUGH SOB X 6 WEEKS ALL SYMPTOMS RESOLVED   GERD (gastroesophageal reflux disease)    Headache    Hypertension    not currently on medications   Wears glasses 10/04/2020   Past Surgical History:  Procedure Laterality Date   BREAST LUMPECTOMY Right 1991   BENIGN   CHOLECYSTECTOMY  2006   LAPAROSCOPIC   DILATATION AND CURETTAGE/HYSTEROSCOPY WITH MINERVA N/A 07/30/2018   Procedure: DILATATION AND CURETTAGE/HYSTEROSCOPY WITH MINERVA;  Surgeon: Starla Harland BROCKS, MD;  Location: [facility];  Service: Gynecology;  Laterality: N/A;   TUBAL LIGATION     21 YRS AGO PER PT ON 10-04-2020   VAGINAL HYSTERECTOMY  10/11/2020   Procedure: TOTAL HYSTERECTOMY VAGINAL with RIGHT SALPINGECTOMY;  Surgeon: Lorence Ozell CROME, MD;  Location: [facility];  Service: Gynecology;;   Patient Active Problem List   Diagnosis Date Noted   Pain in left knee 04/29/2023   Osteoarthritis, multiple sites 11/12/2022   Pain in right hip 11/12/2022   Numbness and tingling 11/12/2022   Post-operative state 11/21/2020   H/O vaginal hysterectomy 11/21/2020   Vaginal discharge 10/05/2020   Menopausal symptoms 09/01/2020   Dermatophytosis 05/25/2020   Obesity 04/28/2018   Anxiety and depression 10/04/2016     PCP: [name], NP  REFERRING PROVIDER: Persons, [name], PA   REFERRING DIAG: [phone] (ICD-10-CM) - Chronic pain of left knee   THERAPY DIAG:  Chronic pain of left knee  Muscle weakness (generalized)  Rationale for Evaluation and Treatment: Rehabilitation  ONSET DATE: Chronic  SUBJECTIVE:   SUBJECTIVE STATEMENT: Pt reports doing better little by little and weaning herself off of brace. Pain is 2/10. Reports not being compliant with HEP, but pain is still improving.  Eval: Pt presents to PT with reports of L knee pain after twisting injury while getting out of shower about 6 weeks ago. Notes that pain has slightly lowered after getting knee draining and cortisone injection last week. Pain is mainly in posterior L knee and calf. Denies pop or clunk sensation during injury, does have L knee buckling when walking.   PERTINENT HISTORY: HTN, see other PMH   PAIN:  2/10  Are you having pain?  Yes: NPRS scale: 8/10 Worst: 10/10 Pain location: L posterolateral knee Pain description: sharp, sore Aggravating factors: walking, stairs, transfers, work Relieving factors: none  PRECAUTIONS: None  RED FLAGS: None   WEIGHT BEARING RESTRICTIONS: No  FALLS:  Has patient fallen in last 6 months? Yes. Number of falls two  LIVING ENVIRONMENT: Lives with: lives with their family Lives in: House/apartment Stairs: Yes: External: 13  steps; bilateral but cannot reach both Has following equipment at home: None  OCCUPATION: Product Manager   PLOF: Independent  PATIENT GOALS: decrease knee pain with work and daily activities  OBJECTIVE:  Note: Objective measures were completed at Evaluation unless otherwise noted.  DIAGNOSTIC FINDINGS: See imaging   PATIENT SURVEYS:  LEFS: 22/80  COGNITION: Overall cognitive status: Within functional limits for tasks assessed     SENSATION: WFL  POSTURE: rounded shoulders and forward  head  PALPATION: TTP to L lateral posterior knee, L lateral gastroc  LOWER EXTREMITY ROM:  Active ROM Right eval Left eval  Knee flexion 122 85  Knee extension 0 0   (Blank rows = not tested)  LOWER EXTREMITY MMT:  MMT Right eval Left eval  Hip flexion 4 3+  Hip extension    Hip abduction 3+ 3+  Hip adduction    Hip internal rotation    Hip external rotation    Knee flexion 4 3  Knee extension 4 3  Ankle dorsiflexion    Ankle plantarflexion    Ankle inversion    Ankle eversion     (Blank rows = not tested)  LOWER EXTREMITY SPECIAL TESTS:  Knee special tests: Posterior drawer test: negative and Lachman Test: negative  FUNCTIONAL TESTS:  30 Second Sit to Stand: 5 reps with UE  GAIT: Distance walked: 57ft Assistive device utilized: None Level of assistance: Complete Independence Comments: antalgic gait R   TREATMENT: Therapeutic exercise 11/3 Seated LAQ x8x3s BL Seated leg extension 10# 2x6x3s BL Seated HS curl 2x6x3s 10# Seated L GTB PF 2x10x3s Seated GTB clamshell x10x3s  Self care HEP reassessment and update (reinforced importance of doing it consistently)   Modalities Heat pack anterior and posterior L knee x10 min    [facility] Adult PT Treatment:                                                DATE: 11/21/2023 Supine QS with towel under knee 2x10 - 5 hold L Supine SLR 3x5 L Hooklying clamshell 2x15 GTB Seated PF GTB 2x10 LAQ x 10 L Manual Therapy: STM L proximal calf and L lateral hamstring/knee AP and PA grade II mobs to L knee in flexion Modalities: MHP to anterior thigh and posterior knee/calf L in prone x 10 min post session   TREATMENT 11/18/2023:  Therapeutic Exercise: HEP reassessment and update Seated hip marches 2x8x3s UL green TB PF 2x8x3s BL Seated clamshell red 2x8x3s Green TB HS curl x8x3s BL STS x4 (120 degrees KF, cuing on loading LLE)  Manual Therapy: STM left medial quad, proximal gastroc  Heating pad (last 10  minutes of session on anterior and posterior L knee)     PATIENT EDUCATION:  Education details: eval findings, LEFS, HEP, POC Person educated: Patient Education method: Explanation, Demonstration, and Handouts Education comprehension: verbalized understanding and returned demonstration  HOME EXERCISE PROGRAM: Access Code: [email or web address] URL: [email or web address] Date: 10/29/2023 Prepared by: Alm Kingdom  Exercises - Supine Heel Slide with Strap  - 1 x daily - 7 x weekly - 2 sets - 10 reps - 5 sec hold - Long Sitting Quad Set  - 1 x daily - 7 x weekly - 2 sets - 10 reps - 5 sec hold - Calf Mobilization with Small Ball  - 1 x daily - 7 x weekly - 2-3  min hold - Gastroc Stretch on Wall  - 1 x daily - 7 x weekly - 2-3 reps - 30 sec hold  ASSESSMENT:  CLINICAL IMPRESSION: Patient tolerated treatment with no increases in pain with progressions in BL quad, glute, HS, and L calf loading. Current deficits include: activity tolerance, excessive pain, and functional strength. As a result, patient would continue to benefit from skilled PT to address said deficits via plan below.     EVAL: Patient is a 53 y.o. F who was seen today for physical therapy evaluation and treatment for acute on chronic L knee pain. Physical findings are consistent with referring provider impression as pt demonstrates decrease in L knee ROM and strength. LEFS shows severe disability in performance of home ADLs and higher level activities. Pt would benefit from skilled PT services working on improving L knee ROM and strength in order to decrease pain and improve comfort.   OBJECTIVE IMPAIRMENTS: Abnormal gait, decreased activity tolerance, decreased mobility, difficulty walking, decreased ROM, decreased strength, and pain   ACTIVITY LIMITATIONS: sitting, standing, squatting, sleeping, stairs, transfers, and locomotion level  PARTICIPATION LIMITATIONS: meal prep, cleaning, driving, shopping, community  activity, occupation, and yard work  PERSONAL FACTORS: Time since onset of injury/illness/exacerbation and 1-2 comorbidities: HTN are also affecting patient's functional outcome.   REHAB POTENTIAL: Good  CLINICAL DECISION MAKING: Evolving/moderate complexity  EVALUATION COMPLEXITY: Moderate   GOALS: Goals reviewed with patient? No  SHORT TERM GOALS: Target date: 11/19/2023   Pt will be compliant and knowledgeable with initial HEP for improved comfort and carryover Baseline: initial HEP given  Goal status: MET  2.  Pt will self report left knee pain no greater than 6/10 for improved comfort and functional ability Baseline: 10/10 at worst Goal status: INITIAL   LONG TERM GOALS: Target date: 12/25/2023   Pt will improve LEFS to no less than 35/80 as proxy for functional improvement with home ADLs and higher level community activity Baseline: 22/80 Goal status: INITIAL   2.  Pt will self report left knee pain no greater than 4/10 for improved comfort and functional ability Baseline: 10/10 at worst Goal status: INITIAL   3.  Pt will increase 30 Second Sit to Stand rep count to no less than 10 reps for improved balance, strength, and functional mobility Baseline: 5 reps  Goal status: INITIAL   4.  Pt will improve L knee flexion AROM to at least 120 degrees for improved comfort and functional mobility Baseline: 85 degrees Goal status: INITIAL  5.  Pt will improve L LE MMT to at least 4/5 for improved comfort and functional mobility while decreasing knee pain Baseline: see chart Goal status: INITIAL   PLAN:  PT FREQUENCY: 1-2x/week  PT DURATION: 8 weeks  PLANNED INTERVENTIONS: 97164- PT Re-evaluation, 97110-Therapeutic exercises, 97530- Therapeutic activity, 97112- Neuromuscular re-education, 97535- Self Care, 02859- Manual therapy, [id number]- Gait training, [phone]- Aquatic Therapy, [phone]- Electrical stimulation (unattended), [id number]- Electrical stimulation (manual), 97016-  Vasopneumatic device, 20560 (1-2 muscles), 20561 (3+ muscles)- Dry Needling, Patient/Family education, Cryotherapy, and Moist heat  PLAN FOR NEXT SESSION: HEP assessment and progression, symptom modulation, and loading (isolated and/or functional). Manual therapy and NME training as needed.     Washington Greener  Jajuan Skoog, PT 11/25/2023, 11:34 AM

## 2023-11-28 ENCOUNTER — Ambulatory Visit

## 2023-11-28 NOTE — Therapy (Incomplete)
 OUTPATIENT PHYSICAL THERAPY TREATMENT   Patient Name: Kristine Kelly DOB:[birth year], 53 y.o., female Today's Date: 11/28/2023  END OF SESSION:       Past Medical History:  Diagnosis Date   Anxiety    NO RECENT ANXIETY ATTACKS PER PT ON 10-04-2020   Arthritis    OA LEFT KNEE   Claustrophobia 10/04/2020   COVID-19 2020   COUGH SOB X 6 WEEKS ALL SYMPTOMS RESOLVED   GERD (gastroesophageal reflux disease)    Headache    Hypertension    not currently on medications   Wears glasses 10/04/2020   Past Surgical History:  Procedure Laterality Date   BREAST LUMPECTOMY Right 1991   BENIGN   CHOLECYSTECTOMY  2006   LAPAROSCOPIC   DILATATION AND CURETTAGE/HYSTEROSCOPY WITH MINERVA N/A 07/30/2018   Procedure: DILATATION AND CURETTAGE/HYSTEROSCOPY WITH MINERVA;  Surgeon: Starla Harland BROCKS, MD;  Location: [facility];  Service: Gynecology;  Laterality: N/A;   TUBAL LIGATION     21 YRS AGO PER PT ON 10-04-2020   VAGINAL HYSTERECTOMY  10/11/2020   Procedure: TOTAL HYSTERECTOMY VAGINAL with RIGHT SALPINGECTOMY;  Surgeon: Lorence Ozell CROME, MD;  Location: [facility];  Service: Gynecology;;   Patient Active Problem List   Diagnosis Date Noted   Pain in left knee 04/29/2023   Osteoarthritis, multiple sites 11/12/2022   Pain in right hip 11/12/2022   Numbness and tingling 11/12/2022   Post-operative state 11/21/2020   H/O vaginal hysterectomy 11/21/2020   Vaginal discharge 10/05/2020   Menopausal symptoms 09/01/2020   Dermatophytosis 05/25/2020   Obesity 04/28/2018   Anxiety and depression 10/04/2016    PCP: [name], NP  REFERRING PROVIDER: Persons, [name], PA   REFERRING DIAG: [phone] (ICD-10-CM) - Chronic pain of left knee   THERAPY DIAG:  No diagnosis found.  Rationale for Evaluation and Treatment: Rehabilitation  ONSET DATE: Chronic  SUBJECTIVE:   SUBJECTIVE STATEMENT: ***  Eval: Pt presents to PT with reports  of L knee pain after twisting injury while getting out of shower about 6 weeks ago. Notes that pain has slightly lowered after getting knee draining and cortisone injection last week. Pain is mainly in posterior L knee and calf. Denies pop or clunk sensation during injury, does have L knee buckling when walking.   PERTINENT HISTORY: HTN, see other PMH   PAIN:  2/10  Are you having pain?  Yes: NPRS scale: 8/10 Worst: 10/10 Pain location: L posterolateral knee Pain description: sharp, sore Aggravating factors: walking, stairs, transfers, work Relieving factors: none  PRECAUTIONS: None  RED FLAGS: None   WEIGHT BEARING RESTRICTIONS: No  FALLS:  Has patient fallen in last 6 months? Yes. Number of falls two  LIVING ENVIRONMENT: Lives with: lives with their family Lives in: House/apartment Stairs: Yes: External: 13 steps; bilateral but cannot reach both Has following equipment at home: None  OCCUPATION: Product Manager   PLOF: Independent  PATIENT GOALS: decrease knee pain with work and daily activities  OBJECTIVE:  Note: Objective measures were completed at Evaluation unless otherwise noted.  DIAGNOSTIC FINDINGS: See imaging   PATIENT SURVEYS:  LEFS: 22/80  COGNITION: Overall cognitive status: Within functional limits for tasks assessed     SENSATION: WFL  POSTURE: rounded shoulders and forward head  PALPATION: TTP to L lateral posterior knee, L lateral gastroc  LOWER EXTREMITY ROM:  Active ROM Right eval Left eval  Knee flexion 122 85  Knee extension 0 0   (Blank  rows = not tested)  LOWER EXTREMITY MMT:  MMT Right eval Left eval  Hip flexion 4 3+  Hip extension    Hip abduction 3+ 3+  Hip adduction    Hip internal rotation    Hip external rotation    Knee flexion 4 3  Knee extension 4 3  Ankle dorsiflexion    Ankle plantarflexion    Ankle inversion    Ankle eversion     (Blank rows = not tested)  LOWER EXTREMITY SPECIAL  TESTS:  Knee special tests: Posterior drawer test: negative and Lachman Test: negative  FUNCTIONAL TESTS:  30 Second Sit to Stand: 5 reps with UE  GAIT: Distance walked: 28ft Assistive device utilized: None Level of assistance: Complete Independence Comments: antalgic gait R   TREATMENT: Therapeutic exercise 11/3 Seated LAQ x8x3s BL Seated leg extension 10# 2x6x3s BL Seated HS curl 2x6x3s 10# Seated L GTB PF 2x10x3s Seated GTB clamshell x10x3s  Self care HEP reassessment and update (reinforced importance of doing it consistently)   Modalities Heat pack anterior and posterior L knee x10 min    [facility] Adult PT Treatment:                                                DATE: 11/21/2023 Supine QS with towel under knee 2x10 - 5 hold L Supine SLR 3x5 L Hooklying clamshell 2x15 GTB Seated PF GTB 2x10 LAQ x 10 L Manual Therapy: STM L proximal calf and L lateral hamstring/knee AP and PA grade II mobs to L knee in flexion Modalities: MHP to anterior thigh and posterior knee/calf L in prone x 10 min post session   TREATMENT 11/18/2023:  Therapeutic Exercise: HEP reassessment and update Seated hip marches 2x8x3s UL green TB PF 2x8x3s BL Seated clamshell red 2x8x3s Green TB HS curl x8x3s BL STS x4 (120 degrees KF, cuing on loading LLE)  Manual Therapy: STM left medial quad, proximal gastroc  Heating pad (last 10 minutes of session on anterior and posterior L knee)     PATIENT EDUCATION:  Education details: eval findings, LEFS, HEP, POC Person educated: Patient Education method: Explanation, Demonstration, and Handouts Education comprehension: verbalized understanding and returned demonstration  HOME EXERCISE PROGRAM: Access Code: [email or web address] URL: [email or web address] Date: 10/29/2023 Prepared by: Alm Kingdom  Exercises - Supine Heel Slide with Strap  - 1 x daily - 7 x weekly - 2 sets - 10 reps - 5 sec hold - Long Sitting Quad Set  - 1 x daily -  7 x weekly - 2 sets - 10 reps - 5 sec hold - Calf Mobilization with Small Ball  - 1 x daily - 7 x weekly - 2-3 min hold - Gastroc Stretch on Wall  - 1 x daily - 7 x weekly - 2-3 reps - 30 sec hold  ASSESSMENT:  CLINICAL IMPRESSION: ***   EVAL: Patient is a 53 y.o. F who was seen today for physical therapy evaluation and treatment for acute on chronic L knee pain. Physical findings are consistent with referring provider impression as pt demonstrates decrease in L knee ROM and strength. LEFS shows severe disability in performance of home ADLs and higher level activities. Pt would benefit from skilled PT services working on improving L knee ROM and strength in order to decrease pain and improve comfort.   OBJECTIVE IMPAIRMENTS: Abnormal gait, decreased  activity tolerance, decreased mobility, difficulty walking, decreased ROM, decreased strength, and pain   ACTIVITY LIMITATIONS: sitting, standing, squatting, sleeping, stairs, transfers, and locomotion level  PARTICIPATION LIMITATIONS: meal prep, cleaning, driving, shopping, community activity, occupation, and yard work  PERSONAL FACTORS: Time since onset of injury/illness/exacerbation and 1-2 comorbidities: HTN are also affecting patient's functional outcome.   REHAB POTENTIAL: Good  CLINICAL DECISION MAKING: Evolving/moderate complexity  EVALUATION COMPLEXITY: Moderate   GOALS: Goals reviewed with patient? No  SHORT TERM GOALS: Target date: 11/19/2023   Pt will be compliant and knowledgeable with initial HEP for improved comfort and carryover Baseline: initial HEP given  Goal status: MET  2.  Pt will self report left knee pain no greater than 6/10 for improved comfort and functional ability Baseline: 10/10 at worst Goal status: INITIAL   LONG TERM GOALS: Target date: 12/25/2023   Pt will improve LEFS to no less than 35/80 as proxy for functional improvement with home ADLs and higher level community activity Baseline:  22/80 Goal status: INITIAL   2.  Pt will self report left knee pain no greater than 4/10 for improved comfort and functional ability Baseline: 10/10 at worst Goal status: INITIAL   3.  Pt will increase 30 Second Sit to Stand rep count to no less than 10 reps for improved balance, strength, and functional mobility Baseline: 5 reps  Goal status: INITIAL   4.  Pt will improve L knee flexion AROM to at least 120 degrees for improved comfort and functional mobility Baseline: 85 degrees Goal status: INITIAL  5.  Pt will improve L LE MMT to at least 4/5 for improved comfort and functional mobility while decreasing knee pain Baseline: see chart Goal status: INITIAL   PLAN:  PT FREQUENCY: 1-2x/week  PT DURATION: 8 weeks  PLANNED INTERVENTIONS: 97164- PT Re-evaluation, 97110-Therapeutic exercises, 97530- Therapeutic activity, 97112- Neuromuscular re-education, 97535- Self Care, 02859- Manual therapy, [id number]- Gait training, [phone]- Aquatic Therapy, [phone]- Electrical stimulation (unattended), [id number]- Electrical stimulation (manual), 97016- Vasopneumatic device, 20560 (1-2 muscles), 20561 (3+ muscles)- Dry Needling, Patient/Family education, Cryotherapy, and Moist heat  PLAN FOR NEXT SESSION: HEP assessment and progression, symptom modulation, and loading (isolated and/or functional). Manual therapy and NME training as needed.     Alm JAYSON Kingdom, PT 11/28/2023, 8:08 AM

## 2023-12-02 ENCOUNTER — Ambulatory Visit

## 2023-12-02 DIAGNOSIS — M6281 Muscle weakness (generalized): Secondary | ICD-10-CM

## 2023-12-02 DIAGNOSIS — M25562 Pain in left knee: Secondary | ICD-10-CM | POA: Diagnosis not present

## 2023-12-02 DIAGNOSIS — G8929 Other chronic pain: Secondary | ICD-10-CM

## 2023-12-02 NOTE — Therapy (Signed)
 OUTPATIENT PHYSICAL THERAPY TREATMENT   Patient Name: Kristine Kelly MRN: [id number] DOB:12/15/1970, 53 y.o., female Today's Date: 12/02/2023  END OF SESSION:  PT End of Session - 12/02/23 1146     Visit Number 7    Number of Visits 17    Date for Recertification  12/25/23    Authorization Type [employer]    PT Start Time 1146    PT Stop Time 1224    PT Time Calculation (min) 38 min    Activity Tolerance Patient tolerated treatment well    Behavior During Therapy WFL for tasks assessed/performed              Past Medical History:  Diagnosis Date   Anxiety    NO RECENT ANXIETY ATTACKS PER PT ON 10-04-2020   Arthritis    OA LEFT KNEE   Claustrophobia 10/04/2020   COVID-19 2020   COUGH SOB X 6 WEEKS ALL SYMPTOMS RESOLVED   GERD (gastroesophageal reflux disease)    Headache    Hypertension    not currently on medications   Wears glasses 10/04/2020   Past Surgical History:  Procedure Laterality Date   BREAST LUMPECTOMY Right 1991   BENIGN   CHOLECYSTECTOMY  2006   LAPAROSCOPIC   DILATATION AND CURETTAGE/HYSTEROSCOPY WITH MINERVA N/A 07/30/2018   Procedure: DILATATION AND CURETTAGE/HYSTEROSCOPY WITH MINERVA;  Surgeon: Starla Harland BROCKS, MD;  Location: [facility];  Service: Gynecology;  Laterality: N/A;   TUBAL LIGATION     21 YRS AGO PER PT ON 10-04-2020   VAGINAL HYSTERECTOMY  10/11/2020   Procedure: TOTAL HYSTERECTOMY VAGINAL with RIGHT SALPINGECTOMY;  Surgeon: Lorence Ozell CROME, MD;  Location: [facility];  Service: Gynecology;;   Patient Active Problem List   Diagnosis Date Noted   Pain in left knee 04/29/2023   Osteoarthritis, multiple sites 11/12/2022   Pain in right hip 11/12/2022   Numbness and tingling 11/12/2022   Post-operative state 11/21/2020   H/O vaginal hysterectomy 11/21/2020   Vaginal discharge 10/05/2020   Menopausal symptoms 09/01/2020   Dermatophytosis 05/25/2020   Obesity 04/28/2018   Anxiety and depression 10/04/2016     PCP: [name], NP  REFERRING PROVIDER: Persons, [name], PA   REFERRING DIAG: [phone] (ICD-10-CM) - Chronic pain of left knee   THERAPY DIAG:  Chronic pain of left knee  Muscle weakness (generalized)  Rationale for Evaluation and Treatment: Rehabilitation  ONSET DATE: Chronic  SUBJECTIVE:   SUBJECTIVE STATEMENT: Pt presents to PT with 3/10 pain in L knee, notes it is getting a little bit better. Has been compliant with HEP with no adverse effect.   Eval: Pt presents to PT with reports of L knee pain after twisting injury while getting out of shower about 6 weeks ago. Notes that pain has slightly lowered after getting knee draining and cortisone injection last week. Pain is mainly in posterior L knee and calf. Denies pop or clunk sensation during injury, does have L knee buckling when walking.   PERTINENT HISTORY: HTN, see other PMH   PAIN:  2/10  Are you having pain?  Yes: NPRS scale: 8/10 Worst: 10/10 Pain location: L posterolateral knee Pain description: sharp, sore Aggravating factors: walking, stairs, transfers, work Relieving factors: none  PRECAUTIONS: None  RED FLAGS: None   WEIGHT BEARING RESTRICTIONS: No  FALLS:  Has patient fallen in last 6 months? Yes. Number of falls two  LIVING ENVIRONMENT: Lives with: lives with their family Lives in: House/apartment Stairs: Yes:  External: 13 steps; bilateral but cannot reach both Has following equipment at home: None  OCCUPATION: Product Manager   PLOF: Independent  PATIENT GOALS: decrease knee pain with work and daily activities  OBJECTIVE:  Note: Objective measures were completed at Evaluation unless otherwise noted.  DIAGNOSTIC FINDINGS: See imaging   PATIENT SURVEYS:  LEFS: 22/80  COGNITION: Overall cognitive status: Within functional limits for tasks assessed     SENSATION: WFL  POSTURE: rounded shoulders and forward head  PALPATION: TTP to L lateral  posterior knee, L lateral gastroc  LOWER EXTREMITY ROM:  Active ROM Right eval Left eval  Knee flexion 122 85  Knee extension 0 0   (Blank rows = not tested)  LOWER EXTREMITY MMT:  MMT Right eval Left eval  Hip flexion 4 3+  Hip extension    Hip abduction 3+ 3+  Hip adduction    Hip internal rotation    Hip external rotation    Knee flexion 4 3  Knee extension 4 3  Ankle dorsiflexion    Ankle plantarflexion    Ankle inversion    Ankle eversion     (Blank rows = not tested)  LOWER EXTREMITY SPECIAL TESTS:  Knee special tests: Posterior drawer test: negative and Lachman Test: negative  FUNCTIONAL TESTS:  30 Second Sit to Stand: 5 reps with UE  GAIT: Distance walked: 52ft Assistive device utilized: None Level of assistance: Complete Independence Comments: antalgic gait R   TREATMENT: [facility] Adult PT Treatment:                                                DATE: 12/02/2023 Nutep lvl 4 UE/LE x 4 min while taking subjective LAQ 2x10 L 2# Seated hamstring curl 2x10 GTB Supine QS with towel under knee x 5 - 5 hold L Supine SLR 2x10 L Bridge 3x5 Hooklying clamshell 2x15 GTB Seated PF GTB 2x15 STS 3x5 high table *Consider step ups next session Manual Therapy: AP and PA grade II mobs to L knee in flexion  Therapeutic exercise 11/3 Seated LAQ x8x3s BL Seated leg extension 10# 2x6x3s BL Seated HS curl 2x6x3s 10# Seated L GTB PF 2x10x3s Seated GTB clamshell x10x3s Self care HEP reassessment and update (reinforced importance of doing it consistently) Modalities Heat pack anterior and posterior L knee x10 min  [facility] Adult PT Treatment:                                                DATE: 11/21/2023 Supine QS with towel under knee 2x10 - 5 hold L Supine SLR 3x5 L Hooklying clamshell 2x15 GTB Seated PF GTB 2x10 LAQ x 10 L Manual Therapy: STM L proximal calf and L lateral hamstring/knee AP and PA grade II mobs to L knee in flexion Modalities: MHP to  anterior thigh and posterior knee/calf L in prone x 10 min post session   PATIENT EDUCATION:  Education details: continue HEP Person educated: Patient Education method: Explanation, Demonstration, and Handouts Education comprehension: verbalized understanding and returned demonstration  HOME EXERCISE PROGRAM: Access Code: [email or web address] URL: [email or web address] Date: 10/29/2023 Prepared by: Alm Kingdom  Exercises - Supine Heel Slide with Strap  - 1 x daily - 7 x weekly -  2 sets - 10 reps - 5 sec hold - Long Sitting Quad Set  - 1 x daily - 7 x weekly - 2 sets - 10 reps - 5 sec hold - Calf Mobilization with Small Ball  - 1 x daily - 7 x weekly - 2-3 min hold - Gastroc Stretch on Wall  - 1 x daily - 7 x weekly - 2-3 reps - 30 sec hold  ASSESSMENT:  CLINICAL IMPRESSION: Pt was able to all prescribed exercises with no adverse effect. We progressed LE strengthening exercises as pt shows improving tolerance. Continues to benefit from skilled PT, will progress as able.    EVAL: Patient is a 53 y.o. F who was seen today for physical therapy evaluation and treatment for acute on chronic L knee pain. Physical findings are consistent with referring provider impression as pt demonstrates decrease in L knee ROM and strength. LEFS shows severe disability in performance of home ADLs and higher level activities. Pt would benefit from skilled PT services working on improving L knee ROM and strength in order to decrease pain and improve comfort.   OBJECTIVE IMPAIRMENTS: Abnormal gait, decreased activity tolerance, decreased mobility, difficulty walking, decreased ROM, decreased strength, and pain   ACTIVITY LIMITATIONS: sitting, standing, squatting, sleeping, stairs, transfers, and locomotion level  PARTICIPATION LIMITATIONS: meal prep, cleaning, driving, shopping, community activity, occupation, and yard work  PERSONAL FACTORS: Time since onset of injury/illness/exacerbation and 1-2  comorbidities: HTN are also affecting patient's functional outcome.   REHAB POTENTIAL: Good  CLINICAL DECISION MAKING: Evolving/moderate complexity  EVALUATION COMPLEXITY: Moderate   GOALS: Goals reviewed with patient? No  SHORT TERM GOALS: Target date: 11/19/2023   Pt will be compliant and knowledgeable with initial HEP for improved comfort and carryover Baseline: initial HEP given  Goal status: MET  2.  Pt will self report left knee pain no greater than 6/10 for improved comfort and functional ability Baseline: 10/10 at worst Goal status: INITIAL   LONG TERM GOALS: Target date: 12/25/2023   Pt will improve LEFS to no less than 35/80 as proxy for functional improvement with home ADLs and higher level community activity Baseline: 22/80 Goal status: INITIAL   2.  Pt will self report left knee pain no greater than 4/10 for improved comfort and functional ability Baseline: 10/10 at worst Goal status: INITIAL   3.  Pt will increase 30 Second Sit to Stand rep count to no less than 10 reps for improved balance, strength, and functional mobility Baseline: 5 reps  Goal status: INITIAL   4.  Pt will improve L knee flexion AROM to at least 120 degrees for improved comfort and functional mobility Baseline: 85 degrees Goal status: INITIAL  5.  Pt will improve L LE MMT to at least 4/5 for improved comfort and functional mobility while decreasing knee pain Baseline: see chart Goal status: INITIAL   PLAN:  PT FREQUENCY: 1-2x/week  PT DURATION: 8 weeks  PLANNED INTERVENTIONS: 97164- PT Re-evaluation, 97110-Therapeutic exercises, 97530- Therapeutic activity, 97112- Neuromuscular re-education, 97535- Self Care, 02859- Manual therapy, [id number]- Gait training, [phone]- Aquatic Therapy, [phone]- Electrical stimulation (unattended), [id number]- Electrical stimulation (manual), 97016- Vasopneumatic device, 20560 (1-2 muscles), 20561 (3+ muscles)- Dry Needling, Patient/Family education, Cryotherapy,  and Moist heat  PLAN FOR NEXT SESSION: HEP assessment and progression, symptom modulation, and loading (isolated and/or functional). Manual therapy and NME training as needed.     Alm JAYSON Kingdom, PT 12/02/2023, 12:51 PM

## 2023-12-05 ENCOUNTER — Ambulatory Visit

## 2023-12-05 ENCOUNTER — Telehealth: Payer: Self-pay

## 2023-12-05 NOTE — Telephone Encounter (Signed)
 PT called and spoke with patient regarding missed appointment. She was called into a work meeting today and forgot about appointment. Confirmed next visit and reviewed attendance policy.   Kristine Kelly Kingdom   12/05/23 3:52 PM

## 2023-12-05 NOTE — Therapy (Incomplete)
 OUTPATIENT PHYSICAL THERAPY TREATMENT   Patient Name: Kristine Kelly MRN: [id number] DOB:04-11-1970, 52 y.o., female Today's Date: 12/05/2023  END OF SESSION:        Past Medical History:  Diagnosis Date   Anxiety    NO RECENT ANXIETY ATTACKS PER PT ON 10-04-2020   Arthritis    OA LEFT KNEE   Claustrophobia 10/04/2020   COVID-19 2020   COUGH SOB X 6 WEEKS ALL SYMPTOMS RESOLVED   GERD (gastroesophageal reflux disease)    Headache    Hypertension    not currently on medications   Wears glasses 10/04/2020   Past Surgical History:  Procedure Laterality Date   BREAST LUMPECTOMY Right 1991   BENIGN   CHOLECYSTECTOMY  2006   LAPAROSCOPIC   DILATATION AND CURETTAGE/HYSTEROSCOPY WITH MINERVA N/A 07/30/2018   Procedure: DILATATION AND CURETTAGE/HYSTEROSCOPY WITH MINERVA;  Surgeon: Starla Harland BROCKS, MD;  Location: [facility];  Service: Gynecology;  Laterality: N/A;   TUBAL LIGATION     21 YRS AGO PER PT ON 10-04-2020   VAGINAL HYSTERECTOMY  10/11/2020   Procedure: TOTAL HYSTERECTOMY VAGINAL with RIGHT SALPINGECTOMY;  Surgeon: Lorence Ozell CROME, MD;  Location: [facility];  Service: Gynecology;;   Patient Active Problem List   Diagnosis Date Noted   Pain in left knee 04/29/2023   Osteoarthritis, multiple sites 11/12/2022   Pain in right hip 11/12/2022   Numbness and tingling 11/12/2022   Post-operative state 11/21/2020   H/O vaginal hysterectomy 11/21/2020   Vaginal discharge 10/05/2020   Menopausal symptoms 09/01/2020   Dermatophytosis 05/25/2020   Obesity 04/28/2018   Anxiety and depression 10/04/2016    PCP: [name], NP  REFERRING PROVIDER: Persons, [name], PA   REFERRING DIAG: [phone] (ICD-10-CM) - Chronic pain of left knee   THERAPY DIAG:  No diagnosis found.  Rationale for Evaluation and Treatment: Rehabilitation  ONSET DATE: Chronic  SUBJECTIVE:   SUBJECTIVE STATEMENT: ***  Eval: Pt presents to PT with  reports of L knee pain after twisting injury while getting out of shower about 6 weeks ago. Notes that pain has slightly lowered after getting knee draining and cortisone injection last week. Pain is mainly in posterior L knee and calf. Denies pop or clunk sensation during injury, does have L knee buckling when walking.   PERTINENT HISTORY: HTN, see other PMH   PAIN:  2/10  Are you having pain?  Yes: NPRS scale: 8/10 Worst: 10/10 Pain location: L posterolateral knee Pain description: sharp, sore Aggravating factors: walking, stairs, transfers, work Relieving factors: none  PRECAUTIONS: None  RED FLAGS: None   WEIGHT BEARING RESTRICTIONS: No  FALLS:  Has patient fallen in last 6 months? Yes. Number of falls two  LIVING ENVIRONMENT: Lives with: lives with their family Lives in: House/apartment Stairs: Yes: External: 13 steps; bilateral but cannot reach both Has following equipment at home: None  OCCUPATION: Product Manager   PLOF: Independent  PATIENT GOALS: decrease knee pain with work and daily activities  OBJECTIVE:  Note: Objective measures were completed at Evaluation unless otherwise noted.  DIAGNOSTIC FINDINGS: See imaging   PATIENT SURVEYS:  LEFS: 22/80  COGNITION: Overall cognitive status: Within functional limits for tasks assessed     SENSATION: WFL  POSTURE: rounded shoulders and forward head  PALPATION: TTP to L lateral posterior knee, L lateral gastroc  LOWER EXTREMITY ROM:  Active ROM Right eval Left eval  Knee flexion 122 85  Knee extension 0 0   (  Blank rows = not tested)  LOWER EXTREMITY MMT:  MMT Right eval Left eval  Hip flexion 4 3+  Hip extension    Hip abduction 3+ 3+  Hip adduction    Hip internal rotation    Hip external rotation    Knee flexion 4 3  Knee extension 4 3  Ankle dorsiflexion    Ankle plantarflexion    Ankle inversion    Ankle eversion     (Blank rows = not tested)  LOWER EXTREMITY  SPECIAL TESTS:  Knee special tests: Posterior drawer test: negative and Lachman Test: negative  FUNCTIONAL TESTS:  30 Second Sit to Stand: 5 reps with UE  GAIT: Distance walked: 43ft Assistive device utilized: None Level of assistance: Complete Independence Comments: antalgic gait R   TREATMENT: [facility] Adult PT Treatment:                                                DATE: 12/05/2023 Nutep lvl 4 UE/LE x 4 min while taking subjective LAQ 2x10 L 2# Seated hamstring curl 2x10 GTB Supine QS with towel under knee x 5 - 5 hold L Supine SLR 2x10 L Bridge 3x5 Hooklying clamshell 2x15 GTB Seated PF GTB 2x15 STS 3x5 high table *Consider step ups next session Manual Therapy: AP and PA grade II mobs to L knee in flexion  [facility] Adult PT Treatment:                                                DATE: 12/02/2023 Nutep lvl 4 UE/LE x 4 min while taking subjective LAQ 2x10 L 2# Seated hamstring curl 2x10 GTB Supine QS with towel under knee x 5 - 5 hold L Supine SLR 2x10 L Bridge 3x5 Hooklying clamshell 2x15 GTB Seated PF GTB 2x15 STS 3x5 high table *Consider step ups next session Manual Therapy: AP and PA grade II mobs to L knee in flexion  Therapeutic exercise 11/3 Seated LAQ x8x3s BL Seated leg extension 10# 2x6x3s BL Seated HS curl 2x6x3s 10# Seated L GTB PF 2x10x3s Seated GTB clamshell x10x3s Self care HEP reassessment and update (reinforced importance of doing it consistently) Modalities Heat pack anterior and posterior L knee x10 min  [facility] Adult PT Treatment:                                                DATE: 11/21/2023 Supine QS with towel under knee 2x10 - 5 hold L Supine SLR 3x5 L Hooklying clamshell 2x15 GTB Seated PF GTB 2x10 LAQ x 10 L Manual Therapy: STM L proximal calf and L lateral hamstring/knee AP and PA grade II mobs to L knee in flexion Modalities: MHP to anterior thigh and posterior knee/calf L in prone x 10 min post session   PATIENT EDUCATION:   Education details: continue HEP Person educated: Patient Education method: Explanation, Demonstration, and Handouts Education comprehension: verbalized understanding and returned demonstration  HOME EXERCISE PROGRAM: Access Code: [email or web address] URL: [email or web address] Date: 10/29/2023 Prepared by: Alm Kingdom  Exercises - Supine Heel Slide with Strap  - 1 x daily -  7 x weekly - 2 sets - 10 reps - 5 sec hold - Long Sitting Quad Set  - 1 x daily - 7 x weekly - 2 sets - 10 reps - 5 sec hold - Calf Mobilization with Small Ball  - 1 x daily - 7 x weekly - 2-3 min hold - Gastroc Stretch on Wall  - 1 x daily - 7 x weekly - 2-3 reps - 30 sec hold  ASSESSMENT:  CLINICAL IMPRESSION: Pt was able to all prescribed exercises with no adverse effect. We progressed LE strengthening exercises as pt shows improving tolerance. Continues to benefit from skilled PT, will progress as able.    EVAL: Patient is a 53 y.o. F who was seen today for physical therapy evaluation and treatment for acute on chronic L knee pain. Physical findings are consistent with referring provider impression as pt demonstrates decrease in L knee ROM and strength. LEFS shows severe disability in performance of home ADLs and higher level activities. Pt would benefit from skilled PT services working on improving L knee ROM and strength in order to decrease pain and improve comfort.   OBJECTIVE IMPAIRMENTS: Abnormal gait, decreased activity tolerance, decreased mobility, difficulty walking, decreased ROM, decreased strength, and pain   ACTIVITY LIMITATIONS: sitting, standing, squatting, sleeping, stairs, transfers, and locomotion level  PARTICIPATION LIMITATIONS: meal prep, cleaning, driving, shopping, community activity, occupation, and yard work  PERSONAL FACTORS: Time since onset of injury/illness/exacerbation and 1-2 comorbidities: HTN are also affecting patient's functional outcome.   REHAB POTENTIAL:  Good  CLINICAL DECISION MAKING: Evolving/moderate complexity  EVALUATION COMPLEXITY: Moderate   GOALS: Goals reviewed with patient? No  SHORT TERM GOALS: Target date: 11/19/2023   Pt will be compliant and knowledgeable with initial HEP for improved comfort and carryover Baseline: initial HEP given  Goal status: MET  2.  Pt will self report left knee pain no greater than 6/10 for improved comfort and functional ability Baseline: 10/10 at worst Goal status: INITIAL   LONG TERM GOALS: Target date: 12/25/2023   Pt will improve LEFS to no less than 35/80 as proxy for functional improvement with home ADLs and higher level community activity Baseline: 22/80 Goal status: INITIAL   2.  Pt will self report left knee pain no greater than 4/10 for improved comfort and functional ability Baseline: 10/10 at worst Goal status: INITIAL   3.  Pt will increase 30 Second Sit to Stand rep count to no less than 10 reps for improved balance, strength, and functional mobility Baseline: 5 reps  Goal status: INITIAL   4.  Pt will improve L knee flexion AROM to at least 120 degrees for improved comfort and functional mobility Baseline: 85 degrees Goal status: INITIAL  5.  Pt will improve L LE MMT to at least 4/5 for improved comfort and functional mobility while decreasing knee pain Baseline: see chart Goal status: INITIAL   PLAN:  PT FREQUENCY: 1-2x/week  PT DURATION: 8 weeks  PLANNED INTERVENTIONS: 97164- PT Re-evaluation, 97110-Therapeutic exercises, 97530- Therapeutic activity, 97112- Neuromuscular re-education, 97535- Self Care, 02859- Manual therapy, [id number]- Gait training, [phone]- Aquatic Therapy, [phone]- Electrical stimulation (unattended), [id number]- Electrical stimulation (manual), 97016- Vasopneumatic device, 20560 (1-2 muscles), 20561 (3+ muscles)- Dry Needling, Patient/Family education, Cryotherapy, and Moist heat  PLAN FOR NEXT SESSION: HEP assessment and progression, symptom  modulation, and loading (isolated and/or functional). Manual therapy and NME training as needed.     Alm JAYSON Kingdom, PT 12/05/2023, 12:06 PM

## 2023-12-12 ENCOUNTER — Ambulatory Visit

## 2023-12-18 ENCOUNTER — Ambulatory Visit: Admitting: Physician Assistant

## 2023-12-18 ENCOUNTER — Ambulatory Visit (HOSPITAL_COMMUNITY)
Admission: RE | Admit: 2023-12-18 | Discharge: 2023-12-18 | Disposition: A | Discharge status: Home / Self Care | Source: Ambulatory Visit | Attending: Physician Assistant | Admitting: Physician Assistant

## 2023-12-18 ENCOUNTER — Telehealth: Payer: Self-pay

## 2023-12-18 DIAGNOSIS — M1711 Unilateral primary osteoarthritis, right knee: Secondary | ICD-10-CM

## 2023-12-18 DIAGNOSIS — M25562 Pain in left knee: Secondary | ICD-10-CM

## 2023-12-18 DIAGNOSIS — M150 Primary generalized (osteo)arthritis: Secondary | ICD-10-CM | POA: Diagnosis not present

## 2023-12-18 DIAGNOSIS — M79662 Pain in left lower leg: Secondary | ICD-10-CM | POA: Insufficient documentation

## 2023-12-18 DIAGNOSIS — G8929 Other chronic pain: Secondary | ICD-10-CM | POA: Diagnosis not present

## 2023-12-18 MED ORDER — METHYLPREDNISOLONE ACETATE 40 MG/ML IJ SUSP
40.0000 mg | INTRAMUSCULAR | Status: AC | PRN
  Administered 2023-12-18: 40 mg via INTRA_ARTICULAR

## 2023-12-18 MED ORDER — LIDOCAINE HCL 1 % IJ SOLN
5.0000 mL | INTRAMUSCULAR | Status: AC | PRN
  Administered 2023-12-18: 5 mL

## 2023-12-18 MED ORDER — LIDOCAINE HCL 1 % IJ SOLN
4.0000 mL | INTRAMUSCULAR | Status: AC | PRN
  Administered 2023-12-18: 4 mL

## 2023-12-18 NOTE — Progress Notes (Signed)
 Office Visit Note   Patient: Kristine Kelly           Date of Birth: 29-May-1970           MRN: [id number] Visit Date: 12/18/2023              Requested by: Neliah Cuyler, [name], PA [address] [place],  [place] [address] PCP: Jaycee Greig PARAS, NP  Chief Complaint  Patient presents with  . Left Knee - Pain      HPI: Kristine Kelly is a pleasant 53 year old woman who has ongoing pain catching and locking in her left knee.  This first began 3-1/2 months ago.  She has had repeat aspirations on the knee and injection with steroid.  Most recently she has been undergoing physical therapy for the last several weeks.  She still feels like it is not really any better she continues to have more fluid Accumulate.  She also causing problems with her right knee and her right hip.  She thinks this is because she is compensating  Assessment & Plan: Visit Diagnoses:  1. Chronic pain of left knee   2. Primary osteoarthritis involving multiple joints   3. Pain in left lower leg     Plan: At this point I will go forward with a steroid injection into her right knee.  With her left knee I cannot inject steroid but will attempt to aspirate some fluid.  She has been having this problem now for going on for months.  She has tried medication as well as steroid injections and has done several weeks of physical therapy.  She is not getting better.  Her x-rays do not demonstrate any osseous injuries that would be consistent with this therefore I am recommending an MRI.  She is also complaining of some left calf pain.  She does have pain with passive flexion of her ankle so also will order an ultrasound  Follow-Up Instructions: After MRI  Ortho Exam  Patient is alert, oriented, no adenopathy, well-dressed, normal affect, normal respiratory effort. Examination of her left knee she has a mild to moderate effusion no redness no instability she has pain with terminal extension and flexion.  Compartments are soft however she  does have pain in the calf of her leg which is accentuated with passive stretch.  On the right knee no crepitus no effusion she has pain with range of motion.  Has pain over the lateral posterior hip.  Neurologically she is intact with 5 out of 5 strength    Imaging: No results found. No images are attached to the encounter.  Labs: Lab Results  Component Value Date   HGBA1C 5.6 10/18/2021   HGBA1C 5.7 (H) 05/30/2020   HGBA1C CANCELED 05/25/2020   ESRSEDRATE 33 (H) 11/13/2022     Lab Results  Component Value Date   ALBUMIN 4.3 08/22/2020   ALBUMIN 4.0 03/26/2020   ALBUMIN 4.1 12/18/2019    No results found for: MG No results found for: VD25OH  No results found for: PREALBUMIN    Latest Ref Rng & Units 10/11/2020   11:14 PM 10/07/2020    3:02 PM 08/22/2020    7:53 AM  CBC EXTENDED  WBC 4.0 - 10.5 K/uL 11.6  7.6  6.7   RBC 3.87 - 5.11 MIL/uL 4.03  4.46  4.85   Hemoglobin 12.0 - 15.0 g/dL 88.0  86.8  85.7   HCT 36.0 - 46.0 % 35.3  40.0  42.6   Platelets 150 -  400 K/uL 198  218  231   NEUT# 1.7 - 7.7 K/uL   5.0   Lymph# 0.7 - 4.0 K/uL   1.2      There is no height or weight on file to calculate BMI.  Orders:  Orders Placed This Encounter  Procedures  . MR Knee Left w/o contrast  . VAS US  LOWER EXTREMITY VENOUS (DVT)   No orders of the defined types were placed in this encounter.    Procedures: Large Joint Inj: R knee on 12/18/2023 1:37 PM Indications: pain and diagnostic evaluation Details: 22 G 1.5 in needle, anteromedial approach  Arthrogram: No  Medications: 40 mg methylPREDNISolone  acetate 40 MG/ML; 4 mL lidocaine  1 % Outcome: tolerated well, no immediate complications Procedure, treatment alternatives, risks and benefits explained, specific risks discussed. Consent was given by the patient.    Large Joint Inj: L knee on 12/18/2023 1:38 PM Indications: pain and diagnostic evaluation Details: 25 G 1.5 in needle, superolateral  approach  Arthrogram: No  Medications: 5 mL lidocaine  1 % Outcome: tolerated well, no immediate complications  After verbal consent was obtained superior lateral pouch was prepped with Betadine  and alcohol.  5 cc of lidocaine  plain was injected.  After adequate analgesia attempt was made to aspirate did gain access to the joint however was unable to aspirate any fluid needle was removed Band-Aid was placed Procedure, treatment alternatives, risks and benefits explained, specific risks discussed. Consent was given by the patient.      Clinical Data: No additional findings.  ROS:  All other systems negative, except as noted in the HPI. Review of Systems  Objective: Vital Signs: LMP 09/22/2020   Specialty Comments:  Kristine Julaine Moons, MD - 12/24/2022  Formatting of this note might be different from the original.  CLINICAL DATA:  Back pain, T12 compression fracture.   EXAM:  MRI THORACIC SPINE WITHOUT CONTRAST   TECHNIQUE:  Multiplanar, multisequence MR imaging of the thoracic spine was  performed. No intravenous contrast was administered.   COMPARISON:  None Available.   FINDINGS:  Alignment: Physiologic.   Vertebrae: No acute fracture, evidence of discitis, or aggressive  bone lesion. Small hemangioma in the T5 vertebral body.   Cord:  Normal signal and morphology.   Paraspinal and other soft tissues: No acute paraspinal abnormality.   Disc levels:   Disc spaces:  Disc spaces are maintained.   Degenerative disease with mild disc height loss at T5-6, T6-7, T7-8  and T10-11.   T1-T2: No disc protrusion, foraminal stenosis or central canal  stenosis.   T2-T3: No disc protrusion, foraminal stenosis or central canal  stenosis.   T3-T4: No disc protrusion, foraminal stenosis or central canal  stenosis.   T4-T5: No disc protrusion, foraminal stenosis or central canal  stenosis.   T5-T6: No disc protrusion, foraminal stenosis or central canal  stenosis.    T6-T7: Moderate right paracentral disc protrusion with inferior  migration of disc material contacting the ventral cervical spinal  cord. No foraminal or central canal stenosis.   T7-T8: No disc protrusion, foraminal stenosis or central canal  stenosis.   T8-T9: Small central disc protrusion. No foraminal or central canal  stenosis.   T9-T10: No disc protrusion, foraminal stenosis or central canal  stenosis.   T10-T11: No disc protrusion, foraminal stenosis or central canal  stenosis.   T11-T12: No disc protrusion, foraminal stenosis or central canal  stenosis.   IMPRESSION:  1. No acute osseous injury of the thoracic spine.  2. At T6-7 there is a moderate right paracentral disc protrusion  with inferior migration of disc material contacting the ventral  cervical spinal cord.  3. At T8-9 there is a small central disc protrusion.  4. No foraminal or central canal stenosis of the thoracic spine.    Electronically Signed    By: Julaine Blanch M.D.    On: 12/24/2022 14:04  PMFS History: Patient Active Problem List   Diagnosis Date Noted  . Pain in left knee 04/29/2023  . Osteoarthritis, multiple sites 11/12/2022  . Pain in right hip 11/12/2022  . Numbness and tingling 11/12/2022  . Post-operative state 11/21/2020  . H/O vaginal hysterectomy 11/21/2020  . Vaginal discharge 10/05/2020  . Menopausal symptoms 09/01/2020  . Dermatophytosis 05/25/2020  . Obesity 04/28/2018  . Anxiety and depression 10/04/2016   Past Medical History:  Diagnosis Date  . Anxiety    NO RECENT ANXIETY ATTACKS PER PT ON 10-04-2020  . Arthritis    OA LEFT KNEE  . Claustrophobia 10/04/2020  . COVID-19 2020   COUGH SOB X 6 WEEKS ALL SYMPTOMS RESOLVED  . GERD (gastroesophageal reflux disease)   . Headache   . Hypertension    not currently on medications  . Wears glasses 10/04/2020    Family History  Problem Relation Age of Onset  . Asthma Mother     Past Surgical History:  Procedure  Laterality Date  . BREAST LUMPECTOMY Right 1991   BENIGN  . CHOLECYSTECTOMY  2006   LAPAROSCOPIC  . DILATATION AND CURETTAGE/HYSTEROSCOPY WITH MINERVA N/A 07/30/2018   Procedure: DILATATION AND CURETTAGE/HYSTEROSCOPY WITH MINERVA;  Surgeon: Starla Harland BROCKS, MD;  Location: [facility];  Service: Gynecology;  Laterality: N/A;  . TUBAL LIGATION     21 YRS AGO PER PT ON 10-04-2020  . VAGINAL HYSTERECTOMY  10/11/2020   Procedure: TOTAL HYSTERECTOMY VAGINAL with RIGHT SALPINGECTOMY;  Surgeon: Lorence Ozell CROME, MD;  Location: [facility];  Service: Gynecology;;   Social History   Occupational History  . Not on file  Tobacco Use  . Smoking status: Never    Passive exposure: Never  . Smokeless tobacco: Never  Vaping Use  . Vaping status: Never Used  Substance and Sexual Activity  . Alcohol use: No  . Drug use: No  . Sexual activity: Yes    Birth control/protection: Surgical

## 2023-12-18 NOTE — Telephone Encounter (Signed)
 Vascular called stating that patient is Negative for DVT, left LE.  If any questions,  CB# [phone].  Called and left a VM advising Kristine Kelly.

## 2023-12-24 ENCOUNTER — Encounter: Payer: Self-pay | Admitting: Physician Assistant

## 2024-01-07 ENCOUNTER — Other Ambulatory Visit

## 2024-01-08 ENCOUNTER — Inpatient Hospital Stay: Admission: RE | Admit: 2024-01-08 | Discharge: 2024-01-08 | Attending: Physician Assistant

## 2024-01-08 DIAGNOSIS — G8929 Other chronic pain: Secondary | ICD-10-CM

## 2024-01-08 DIAGNOSIS — M150 Primary generalized (osteo)arthritis: Secondary | ICD-10-CM

## 2024-01-09 ENCOUNTER — Ambulatory Visit: Admitting: Orthopedic Surgery

## 2024-01-09 DIAGNOSIS — S83207D Unspecified tear of unspecified meniscus, current injury, left knee, subsequent encounter: Secondary | ICD-10-CM

## 2024-01-09 NOTE — Progress Notes (Signed)
 Hi [name]..  Scan reviewed.  Agree that repair is worth trying.  Lauren or Deane can you have Lenward come in to see us  in the near future.  Thanks

## 2024-01-10 ENCOUNTER — Encounter: Payer: Self-pay | Admitting: Orthopedic Surgery

## 2024-01-10 NOTE — Progress Notes (Signed)
 "  Office Visit Note   Patient: Kristine Kelly           Date of Birth: October 16, 1970           MRN: [id number] Visit Date: 01/09/2024 Requested by: Jaycee Greig PARAS, NP [address] [place],  [place] [address] PCP: Jaycee Greig PARAS, NP  Subjective: Chief Complaint  Patient presents with   Left Knee - Pain    HPI: Kristine Kelly is a 53 y.o. female who presents to the office reporting left knee pain.  Patient had a twisting injury several months ago.  Not taking any pain meds.  Works as a production designer, theatre/television/film at [employer] which does involve a lot of time spent on her feet.  No personal or family history of DVT or pulmonary embolism.  Has multiple steps to get into her home.  No diabetes or heart issues.  MRI scan is reviewed and it does show mild arthritis in the medial compartment along with a meniscal root avulsion..                ROS: All systems reviewed are negative as they relate to the chief complaint within the history of present illness.  Patient denies fevers or chills.  Assessment & Plan: Visit Diagnoses:  1. Acute meniscal tear of left knee, subsequent encounter     Plan: Impression is meniscal root avulsion left knee in a patient with increased BMI he does standing work.  This is a tough situation for [employer].  Without treatment she is likely looking at total knee replacement within 5 years.  If we are able to achieve anchorage of that meniscal root then we may be able to delay that.  She would be nonweightbearing for 4 weeks and then her standing endurance would take likely another month to get back to her preoperative status.  The risk and benefits of surgery are discussed with the patient clued not limited to infection or vessel damage knee stiffness as well as failure of the repair which is a real possibility.  Nonetheless I think to delay her need for knee replacement we could attempt this meniscal root repair.  Patient understands risk benefits and wishes to proceed.  All  questions answered.  She does have good stability to the knee but enough laxity to valgus stress that it would facilitate instrument placement into the posterior medial aspect of the knee.  Follow-Up Instructions: No follow-ups on file.   Orders:  No orders of the defined types were placed in this encounter.  No orders of the defined types were placed in this encounter.     Procedures: No procedures performed   Clinical Data: No additional findings.  Objective: Vital Signs: LMP 09/22/2020   Physical Exam:  Constitutional: Patient appears well-developed HEENT:  Head: Normocephalic Eyes:EOM are normal Neck: Normal range of motion Cardiovascular: Normal rate Pulmonary/chest: Effort normal Neurologic: Patient is alert Skin: Skin is warm Psychiatric: Patient has normal mood and affect  Ortho Exam: Ortho exam demonstrates palpable pedal pulses and intact ankle dorsiflexion strength on the left-hand side.  Mild effusion is present.  Does have some posterior medial joint line tenderness as well as posterolateral joint line tenderness.  Extensor mechanism intact and nontender.  Skin is intact in that left knee region.  No groin pain with internal/external Tatian of the leg.  Specialty Comments:  Patel, Hetal Pravin, MD - 12/24/2022  Formatting of this note might be different from the original.  CLINICAL  DATA:  Back pain, T12 compression fracture.   EXAM:  MRI THORACIC SPINE WITHOUT CONTRAST   TECHNIQUE:  Multiplanar, multisequence MR imaging of the thoracic spine was  performed. No intravenous contrast was administered.   COMPARISON:  None Available.   FINDINGS:  Alignment: Physiologic.   Vertebrae: No acute fracture, evidence of discitis, or aggressive  bone lesion. Small hemangioma in the T5 vertebral body.   Cord:  Normal signal and morphology.   Paraspinal and other soft tissues: No acute paraspinal abnormality.   Disc levels:   Disc spaces:  Disc spaces are  maintained.   Degenerative disease with mild disc height loss at T5-6, T6-7, T7-8  and T10-11.   T1-T2: No disc protrusion, foraminal stenosis or central canal  stenosis.   T2-T3: No disc protrusion, foraminal stenosis or central canal  stenosis.   T3-T4: No disc protrusion, foraminal stenosis or central canal  stenosis.   T4-T5: No disc protrusion, foraminal stenosis or central canal  stenosis.   T5-T6: No disc protrusion, foraminal stenosis or central canal  stenosis.   T6-T7: Moderate right paracentral disc protrusion with inferior  migration of disc material contacting the ventral cervical spinal  cord. No foraminal or central canal stenosis.   T7-T8: No disc protrusion, foraminal stenosis or central canal  stenosis.   T8-T9: Small central disc protrusion. No foraminal or central canal  stenosis.   T9-T10: No disc protrusion, foraminal stenosis or central canal  stenosis.   T10-T11: No disc protrusion, foraminal stenosis or central canal  stenosis.   T11-T12: No disc protrusion, foraminal stenosis or central canal  stenosis.   IMPRESSION:  1. No acute osseous injury of the thoracic spine.  2. At T6-7 there is a moderate right paracentral disc protrusion  with inferior migration of disc material contacting the ventral  cervical spinal cord.  3. At T8-9 there is a small central disc protrusion.  4. No foraminal or central canal stenosis of the thoracic spine.    Electronically Signed    By: Julaine Blanch M.D.    On: 12/24/2022 14:04  Imaging: No results found.   PMFS History: Patient Active Problem List   Diagnosis Date Noted   Pain in left knee 04/29/2023   Osteoarthritis, multiple sites 11/12/2022   Pain in right hip 11/12/2022   Numbness and tingling 11/12/2022   Post-operative state 11/21/2020   H/O vaginal hysterectomy 11/21/2020   Vaginal discharge 10/05/2020   Menopausal symptoms 09/01/2020   Dermatophytosis 05/25/2020   Obesity 04/28/2018    Anxiety and depression 10/04/2016   Past Medical History:  Diagnosis Date   Anxiety    NO RECENT ANXIETY ATTACKS PER PT ON 10-04-2020   Arthritis    OA LEFT KNEE   Claustrophobia 10/04/2020   COVID-19 2020   COUGH SOB X 6 WEEKS ALL SYMPTOMS RESOLVED   GERD (gastroesophageal reflux disease)    Headache    Hypertension    not currently on medications   Wears glasses 10/04/2020    Family History  Problem Relation Age of Onset   Asthma Mother     Past Surgical History:  Procedure Laterality Date   BREAST LUMPECTOMY Right 1991   BENIGN   CHOLECYSTECTOMY  2006   LAPAROSCOPIC   DILATATION AND CURETTAGE/HYSTEROSCOPY WITH MINERVA N/A 07/30/2018   Procedure: DILATATION AND CURETTAGE/HYSTEROSCOPY WITH MINERVA;  Surgeon: Starla Harland BROCKS, MD;  Location: [facility];  Service: Gynecology;  Laterality: N/A;   TUBAL LIGATION     21  YRS AGO PER PT ON 10-04-2020   VAGINAL HYSTERECTOMY  10/11/2020   Procedure: TOTAL HYSTERECTOMY VAGINAL with RIGHT SALPINGECTOMY;  Surgeon: Lorence Ozell CROME, MD;  Location: [facility];  Service: Gynecology;;   Social History   Occupational History   Not on file  Tobacco Use   Smoking status: Never    Passive exposure: Never   Smokeless tobacco: Never  Vaping Use   Vaping status: Never Used  Substance and Sexual Activity   Alcohol use: No   Drug use: No   Sexual activity: Yes    Birth control/protection: Surgical        "

## 2024-01-10 NOTE — Progress Notes (Signed)
 thx

## 2024-02-12 ENCOUNTER — Telehealth: Payer: Self-pay | Admitting: Orthopedic Surgery

## 2024-02-12 NOTE — Telephone Encounter (Signed)
 Patient is scheduled 02-24-24 at [facility] for left knee arthroscopy with medial meniscal root repair.  Patient is requesting a letter or formal documentation to provide her employer [employer] stating she is having surgery how long she is expected to be out of work.  Please send documentation via MY CHART.  Please contact patient at [phone] is there are any questions regarding this request

## 2024-02-12 NOTE — Telephone Encounter (Signed)
 Oow 2 mos

## 2024-02-27 ENCOUNTER — Telehealth: Payer: Self-pay | Admitting: Orthopedic Surgery
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
## 2024-03-02 ENCOUNTER — Encounter: Admitting: Surgical

## 2024-03-30 ENCOUNTER — Encounter: Admitting: Surgical
